# Patient Record
Sex: Female | Born: 1985 | Race: Black or African American | Hispanic: No | Marital: Single | Smoking: Former smoker
Health system: Southern US, Community
[De-identification: ages and names within clinical notes are randomized; demographics above are authoritative.]

## PROBLEM LIST (undated history)

## (undated) ENCOUNTER — Inpatient Hospital Stay (HOSPITAL_COMMUNITY): Payer: Self-pay

## (undated) DIAGNOSIS — B009 Herpesviral infection, unspecified: Secondary | ICD-10-CM

## (undated) DIAGNOSIS — D649 Anemia, unspecified: Secondary | ICD-10-CM

## (undated) DIAGNOSIS — D696 Thrombocytopenia, unspecified: Secondary | ICD-10-CM

## (undated) DIAGNOSIS — Z8719 Personal history of other diseases of the digestive system: Secondary | ICD-10-CM

## (undated) HISTORY — PX: DILATION AND CURETTAGE OF UTERUS: SHX78

## (undated) HISTORY — DX: Thrombocytopenia, unspecified: D69.6

---
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
## 2007-05-05 ENCOUNTER — Encounter: Admission: RE | Admit: 2007-05-05 | Discharge: 2007-05-05 | Payer: Self-pay | Admitting: Internal Medicine

## 2007-09-17 ENCOUNTER — Emergency Department (HOSPITAL_COMMUNITY): Admission: EM | Admit: 2007-09-17 | Discharge: 2007-09-17 | Payer: Self-pay | Admitting: Emergency Medicine

## 2008-04-25 ENCOUNTER — Emergency Department (HOSPITAL_COMMUNITY): Admission: EM | Admit: 2008-04-25 | Discharge: 2008-04-25 | Payer: Self-pay | Admitting: Emergency Medicine

## 2008-10-07 ENCOUNTER — Inpatient Hospital Stay (HOSPITAL_COMMUNITY): Admission: AD | Admit: 2008-10-07 | Discharge: 2008-10-07 | Payer: Self-pay | Admitting: Obstetrics & Gynecology

## 2008-11-02 ENCOUNTER — Inpatient Hospital Stay (HOSPITAL_COMMUNITY): Admission: AD | Admit: 2008-11-02 | Discharge: 2008-11-02 | Payer: Self-pay | Admitting: Obstetrics & Gynecology

## 2008-12-28 ENCOUNTER — Ambulatory Visit (HOSPITAL_COMMUNITY): Admission: RE | Admit: 2008-12-28 | Discharge: 2008-12-28 | Payer: Self-pay | Admitting: Obstetrics & Gynecology

## 2009-06-01 ENCOUNTER — Emergency Department (HOSPITAL_COMMUNITY): Admission: EM | Admit: 2009-06-01 | Discharge: 2009-06-01 | Payer: Self-pay | Admitting: Emergency Medicine

## 2010-04-30 ENCOUNTER — Encounter: Payer: Self-pay | Admitting: Family Medicine

## 2010-06-27 LAB — URINE MICROSCOPIC-ADD ON

## 2010-06-27 LAB — URINALYSIS, ROUTINE W REFLEX MICROSCOPIC
Bilirubin Urine: NEGATIVE
Glucose, UA: NEGATIVE mg/dL
Ketones, ur: NEGATIVE mg/dL
Nitrite: NEGATIVE
Protein, ur: 100 mg/dL — AB
Specific Gravity, Urine: 1.015 (ref 1.005–1.030)
Urobilinogen, UA: 0.2 mg/dL (ref 0.0–1.0)
pH: 7.5 (ref 5.0–8.0)

## 2010-06-27 LAB — POCT PREGNANCY, URINE: Preg Test, Ur: NEGATIVE

## 2010-07-15 LAB — WET PREP, GENITAL
Clue Cells Wet Prep HPF POC: NONE SEEN
Trich, Wet Prep: NONE SEEN
Trich, Wet Prep: NONE SEEN
Yeast Wet Prep HPF POC: NONE SEEN

## 2010-07-15 LAB — GC/CHLAMYDIA PROBE AMP, GENITAL
Chlamydia, DNA Probe: NEGATIVE
Chlamydia, DNA Probe: NEGATIVE
GC Probe Amp, Genital: NEGATIVE
GC Probe Amp, Genital: NEGATIVE

## 2010-07-15 LAB — URINALYSIS, ROUTINE W REFLEX MICROSCOPIC
Bilirubin Urine: NEGATIVE
Glucose, UA: NEGATIVE mg/dL
Hgb urine dipstick: NEGATIVE
Ketones, ur: NEGATIVE mg/dL
Nitrite: NEGATIVE
Protein, ur: NEGATIVE mg/dL
Specific Gravity, Urine: >1.03 — ABNORMAL HIGH (ref 1.005–1.030)
Urobilinogen, UA: 1 mg/dL (ref 0.0–1.0)
pH: 6 (ref 5.0–8.0)

## 2010-07-15 LAB — POCT PREGNANCY, URINE: Preg Test, Ur: POSITIVE

## 2010-08-24 NOTE — Op Note (Signed)
Emily Morse, RELLER               ACCOUNT NO.:  [id number]   MEDICAL RECORD NO.:  [id number]          PATIENT TYPE:  AMB   LOCATION:  SDC                           FACILITY:  [facility]   PHYSICIAN:  Phil D. Okey Dupre, M.D.     DATE OF BIRTH:  1985-12-01   DATE OF PROCEDURE:  12/10/2005  DATE OF DISCHARGE:                                 OPERATIVE REPORT   PREOPERATIVE DIAGNOSIS:  Nine week missed abortion.   POSTOPERATIVE DIAGNOSIS:  Nine week missed abortion.   PROCEDURE:  Suction, dilatation, and evacuation.   SURGEON:  Javier Glazier. Okey Dupre, M.D.   ASSISTANT:  Marc Morgans. Mayford Knife, M.D.   ANESTHESIA:  MAC.   COMPLICATIONS:  None.   ESTIMATED BLOOD LOSS:  Minimal.   INDICATIONS FOR PROCEDURE:  25 year old G1 at approximately [redacted] weeks  gestational age presented to the MAU complaining of period like bleeding.  Ultrasound was done that showed embryonic pregnancy at nine weeks gestation  with a beta HCG of 14,652.  Blood type B positive.  The patient is counseled  on the risks and benefits and wants to proceed with suction D&E.   FINDINGS:  Approximate nine week size anteverted uterus.   DESCRIPTION OF PROCEDURE:  The patient was taken to the operating room where  she was placed under anesthesia.  The cervix and vagina were swabbed with  Betadine.  A sterile speculum was placed, a tenaculum was used to grasp the  anterior lip of the cervix.  The uterus sounded to 10.5 cm.  Hegar dilators  were then used to dilate the cervix to a size 8.  A size 8 suction curet was  used to remove the products of conception without difficulty.  The tenaculum  was removed.  Excellent hemostasis was obtained.  The patient was taken to  the recovery room in stable condition.     ______________________________  Marc Morgans Mayford Knife, M.D.    ______________________________  Javier Glazier. Okey Dupre, M.D.   TLW/MEDQ  D:  12/10/2005  T:  12/10/2005  Job:  [id number]

## 2010-08-24 NOTE — Group Therapy Note (Signed)
Emily Morse, Emily Morse NO.:  [id number]   MEDICAL RECORD NO.:  [id number]          PATIENT TYPE:  WOC   LOCATION:  [facility]                   FACILITY:  [facility]   PHYSICIAN:  Carolanne Grumbling, M.D.   DATE OF BIRTH:  08-14-1985   DATE OF SERVICE:  12/25/2005                                    CLINIC NOTE   CHIEF COMPLAINT:  Follow up from suction D&C for missed AB.   HPI:  The patient is an 25 -year-old G1, P0-0-1-0, status post D&E on  December 10, 2005.  Patient presented to the MAU at approximately [redacted] weeks  gestation complaining of period-like bleeding.  Ultrasound was done that  showed an embryonic pregnancy at approximately [redacted] weeks gestational age with  a beta-hCG of [phone].  Blood type B positive.  D&E was performed.  Please  see dictation for full details.  Patient has not had any trouble since then.  Lochia stopped 2-3 days ago.   PAST MEDICAL HISTORY:  Denies any major health problems.   PAST SURGICAL HISTORY:  Per HPI.   GYN HISTORY:  No history of any Pap smears.  Sexually active at the age of  50.  Menarche age 85.  Menses are approximately 30 days apart that last 7  days and medium in flow.   SOCIAL HISTORY:  Patient lives with her mother and stepfather.  She denies  alcohol or drug use.   COMPLETE REVIEW OF SYSTEMS:  Was done per the intake form.   PHYSICAL EXAM:  Blood pressure 114/76, pulse 76, weight 115 pounds 8 ounces.  GENERAL:  A well-developed, well-nourished female in no apparent distress.  HEENT:  Normocephalic, atraumatic.  NECK:  Supple.  CV:  Regular rate and rhythm.  LUNGS:  Clear to auscultation bilaterally.  ABDOMEN:  Soft, flat, nontender, nondistended.  GU:  Normal external genitalia.  VAGINA:  Pink rugae.  CERVIX:  Multiparous.  UTERUS:  Is retroverted an involuted.  ADNEXA:  Free of masses.   IMPRESSION:  An 25 year old G1, P0-0-1-0, status post suction D&E for missed  AB.   PLAN:  1. Pap smear done today.  Gonorrhea  and Chlamydia within normal limits in      the MAU.  2. Ortho-Evra prescribed.  Patient counseled on how to place it and to      continue to use a back up method for the first month.  3. Pathology of the procedure reviewed with the patient and it did show      products of conception with no hydatidiform changes, so no further      workup is needed.           ______________________________  Carolanne Grumbling, M.D.     TW/MEDQ  D:  12/25/2005  T:  12/26/2005  Job:  [id number]

## 2011-04-11 ENCOUNTER — Inpatient Hospital Stay (HOSPITAL_COMMUNITY)
Admission: AD | Admit: 2011-04-11 | Discharge: 2011-04-11 | Disposition: A | Discharge status: Home / Self Care | Payer: Medicaid Other | Source: Ambulatory Visit | Attending: Obstetrics and Gynecology | Admitting: Obstetrics and Gynecology

## 2011-04-11 ENCOUNTER — Encounter (HOSPITAL_COMMUNITY): Payer: Self-pay

## 2011-04-11 DIAGNOSIS — A499 Bacterial infection, unspecified: Secondary | ICD-10-CM | POA: Insufficient documentation

## 2011-04-11 DIAGNOSIS — R109 Unspecified abdominal pain: Secondary | ICD-10-CM | POA: Insufficient documentation

## 2011-04-11 DIAGNOSIS — N760 Acute vaginitis: Secondary | ICD-10-CM

## 2011-04-11 DIAGNOSIS — B9689 Other specified bacterial agents as the cause of diseases classified elsewhere: Secondary | ICD-10-CM | POA: Insufficient documentation

## 2011-04-11 DIAGNOSIS — O26899 Other specified pregnancy related conditions, unspecified trimester: Secondary | ICD-10-CM

## 2011-04-11 DIAGNOSIS — O2390 Unspecified genitourinary tract infection in pregnancy, unspecified trimester: Secondary | ICD-10-CM | POA: Insufficient documentation

## 2011-04-11 HISTORY — DX: Herpesviral infection, unspecified: B00.9

## 2011-04-11 LAB — URINALYSIS, ROUTINE W REFLEX MICROSCOPIC
Bilirubin Urine: NEGATIVE
Glucose, UA: NEGATIVE mg/dL
Hgb urine dipstick: NEGATIVE
Ketones, ur: 15 mg/dL — AB
Leukocytes, UA: NEGATIVE
Nitrite: NEGATIVE
Protein, ur: NEGATIVE mg/dL
Specific Gravity, Urine: >1.03 — ABNORMAL HIGH (ref 1.005–1.030)
Urobilinogen, UA: 1 mg/dL (ref 0.0–1.0)
pH: 6 (ref 5.0–8.0)

## 2011-04-11 LAB — WET PREP, GENITAL
Trich, Wet Prep: NONE SEEN
Yeast Wet Prep HPF POC: NONE SEEN

## 2011-04-11 MED ORDER — ONDANSETRON 8 MG PO TBDP: 8.0000 mg | ORAL_TABLET | Freq: Three times a day (TID) | ORAL | Status: AC | PRN

## 2011-04-11 MED ORDER — METRONIDAZOLE 500 MG PO TABS: 500.0000 mg | ORAL_TABLET | Freq: Two times a day (BID) | ORAL | Status: AC

## 2011-04-11 MED ORDER — PROMETHAZINE HCL 25 MG PO TABS: 25.0000 mg | ORAL_TABLET | Freq: Four times a day (QID) | ORAL | Status: AC | PRN

## 2011-04-11 NOTE — Progress Notes (Signed)
Patient states she has had lower abdominal pain for 1-2 weeks, just not going away, not worse. Has been having headaches almost daily with the pregnancy. No bleeding or vaginal discharge. [facility] with [facility] on 2[facility]5.

## 2011-04-11 NOTE — ED Provider Notes (Signed)
History     Chief Complaint  Patient presents with  . Abdominal Cramping   HPI  Pt is [redacted]w[redacted]d pregnant and presents with headache "all over" that comes and goes and also lower abdominal cramping on and off for about 1 week.  She has an appointment at [facility] 5 for her NOB appt.  She denies spotting or bleeding.  She denies pain with urination or vaginal discharge.  She has not taken anything for pain.  Sometimes her headache is associated with not eating and sometimes not related.  The headaches are usually in the afternoon. She has some problem with constipation- her last bowel movement was yesterday and it was hard, but not painful.    Past Medical History  Diagnosis Date  . Herpes     last out break 2008    Past Surgical History  Procedure Date  . No past surgeries     No family history on file.  History  Substance Use Topics  . Smoking status: Never Smoker   . Smokeless tobacco: Not on file  . Alcohol Use: No    Allergies: No Known Allergies  Prescriptions prior to admission  Medication Sig Dispense Refill  . Prenatal Vit-Fe Fumarate-FA (PRENATAL MULTIVITAMIN) TABS Take 1 tablet by mouth daily.          ROS Physical Exam   Blood pressure 119/67, pulse 75, temperature 98.8 F (37.1 C), temperature source Oral, resp. rate 16, height 5\' 5"  (1.651 m), weight 125 lb 6.4 oz (56.881 kg), last menstrual period 01/13/2011, SpO2 99.00%.  Physical Exam  Vitals reviewed. Constitutional: She is oriented to person, place, and time. She appears well-developed and well-nourished.  HENT:  Head: Normocephalic.  Eyes: Pupils are equal, round, and reactive to light.  Neck: Normal range of motion. Neck supple.  Cardiovascular: Normal rate.   Respiratory: Effort normal.  GI: Soft. She exhibits no distension. There is no tenderness. There is no rebound and no guarding.       FHR obtained with doppler  Genitourinary:       Mod amount of creamy white discharge in vault; uterus 12  week size nontender; adnexal without palpable enlargement or tenderness  Musculoskeletal: Normal range of motion.  Neurological: She is alert and oriented to person, place, and time.  Skin: Skin is warm and dry.  Psychiatric: She has a normal mood and affect.    MAU Course  Procedures Results for orders placed during the hospital encounter of 04/11/11 (from the past 24 hour(s))  URINALYSIS, ROUTINE W REFLEX MICROSCOPIC     Status: Abnormal   Collection Time   04/11/11  3:10 PM      Component Value Range   Color, Urine YELLOW  YELLOW    APPearance CLEAR  CLEAR    Specific Gravity, Urine >1.030 (*) 1.005 - 1.030    pH 6.0  5.0 - 8.0    Glucose, UA NEGATIVE  NEGATIVE (mg/dL)   Hgb urine dipstick NEGATIVE  NEGATIVE    Bilirubin Urine NEGATIVE  NEGATIVE    Ketones, ur 15 (*) NEGATIVE (mg/dL)   Protein, ur NEGATIVE  NEGATIVE (mg/dL)   Urobilinogen, UA 1.0  0.0 - 1.0 (mg/dL)   Nitrite NEGATIVE  NEGATIVE    Leukocytes, UA NEGATIVE  NEGATIVE   WET PREP, GENITAL     Status: Abnormal   Collection Time   04/11/11  5:35 PM      Component Value Range   Yeast, Wet Prep NONE SEEN  NONE  SEEN    Trich, Wet Prep NONE SEEN  NONE SEEN    Clue Cells, Wet Prep MODERATE (*) NONE SEEN    WBC, Wet Prep HPF POC FEW (*) NONE SEEN   wet prep-BV GC/chlamydia-pending  Assessment and Plan  Bacterial vaginosis in pregnancy- prescription for Flagyl 500 mg BID for 7 days F/u with [facility] for NOB appointment  Ravinder Hofland 04/11/2011, 4:27 PM

## 2011-04-11 NOTE — ED Notes (Signed)
Pt reprots her headache is slowily going away and still reports mild cramping at this time.

## 2011-04-12 LAB — GC/CHLAMYDIA PROBE AMP, GENITAL
Chlamydia, DNA Probe: NEGATIVE
GC Probe Amp, Genital: NEGATIVE

## 2011-04-15 NOTE — ED Provider Notes (Signed)
Agree with above note.  Kaci Freel 04/15/2011 10:02 AM   

## 2011-05-14 ENCOUNTER — Other Ambulatory Visit: Payer: Self-pay | Admitting: Obstetrics

## 2011-05-14 ENCOUNTER — Other Ambulatory Visit: Payer: Self-pay

## 2011-05-14 DIAGNOSIS — Z820 Family history of epilepsy and other diseases of the nervous system: Secondary | ICD-10-CM

## 2011-05-14 LAB — OB RESULTS CONSOLE ANTIBODY SCREEN: Antibody Screen: NEGATIVE

## 2011-05-14 LAB — OB RESULTS CONSOLE HEPATITIS B SURFACE ANTIGEN: Hepatitis B Surface Ag: NEGATIVE

## 2011-05-14 LAB — OB RESULTS CONSOLE ABO/RH: RH Type: POSITIVE

## 2011-05-14 LAB — OB RESULTS CONSOLE GC/CHLAMYDIA
Chlamydia: NEGATIVE
Gonorrhea: NEGATIVE

## 2011-05-14 LAB — OB RESULTS CONSOLE RUBELLA ANTIBODY, IGM: Rubella: IMMUNE

## 2011-05-14 LAB — OB RESULTS CONSOLE HIV ANTIBODY (ROUTINE TESTING): HIV: NONREACTIVE

## 2011-05-14 LAB — OB RESULTS CONSOLE RPR: RPR: NONREACTIVE

## 2011-05-16 ENCOUNTER — Ambulatory Visit (HOSPITAL_COMMUNITY)
Admission: RE | Admit: 2011-05-16 | Discharge: 2011-05-16 | Disposition: A | Discharge status: Home / Self Care | Payer: Medicaid Other | Source: Ambulatory Visit | Attending: Obstetrics | Admitting: Obstetrics

## 2011-05-16 DIAGNOSIS — Z1389 Encounter for screening for other disorder: Secondary | ICD-10-CM | POA: Insufficient documentation

## 2011-05-16 DIAGNOSIS — Z820 Family history of epilepsy and other diseases of the nervous system: Secondary | ICD-10-CM

## 2011-05-16 DIAGNOSIS — Z363 Encounter for antenatal screening for malformations: Secondary | ICD-10-CM | POA: Insufficient documentation

## 2011-05-16 DIAGNOSIS — O09299 Supervision of pregnancy with other poor reproductive or obstetric history, unspecified trimester: Secondary | ICD-10-CM | POA: Insufficient documentation

## 2011-05-16 DIAGNOSIS — O358XX0 Maternal care for other (suspected) fetal abnormality and damage, not applicable or unspecified: Secondary | ICD-10-CM | POA: Insufficient documentation

## 2011-05-16 NOTE — Progress Notes (Signed)
Obstetric ultrasound performed today.  No fetal anomalies identified.  Please see full report in [facility].

## 2011-06-27 ENCOUNTER — Ambulatory Visit (HOSPITAL_COMMUNITY)
Admission: RE | Admit: 2011-06-27 | Discharge: 2011-06-27 | Disposition: A | Discharge status: Home / Self Care | Payer: Medicaid Other | Source: Ambulatory Visit | Attending: Obstetrics | Admitting: Obstetrics

## 2011-06-27 DIAGNOSIS — Z3689 Encounter for other specified antenatal screening: Secondary | ICD-10-CM | POA: Insufficient documentation

## 2011-06-27 DIAGNOSIS — O09299 Supervision of pregnancy with other poor reproductive or obstetric history, unspecified trimester: Secondary | ICD-10-CM | POA: Insufficient documentation

## 2011-06-27 DIAGNOSIS — Z820 Family history of epilepsy and other diseases of the nervous system: Secondary | ICD-10-CM

## 2011-08-26 ENCOUNTER — Other Ambulatory Visit: Payer: Self-pay | Admitting: Obstetrics

## 2011-08-26 DIAGNOSIS — O2690 Pregnancy related conditions, unspecified, unspecified trimester: Secondary | ICD-10-CM

## 2011-08-27 ENCOUNTER — Ambulatory Visit (HOSPITAL_COMMUNITY)
Admission: RE | Admit: 2011-08-27 | Discharge: 2011-08-27 | Disposition: A | Discharge status: Home / Self Care | Payer: Medicaid Other | Source: Ambulatory Visit | Attending: Obstetrics | Admitting: Obstetrics

## 2011-08-27 DIAGNOSIS — O2690 Pregnancy related conditions, unspecified, unspecified trimester: Secondary | ICD-10-CM

## 2011-08-27 DIAGNOSIS — Z3689 Encounter for other specified antenatal screening: Secondary | ICD-10-CM | POA: Insufficient documentation

## 2011-08-27 DIAGNOSIS — O09299 Supervision of pregnancy with other poor reproductive or obstetric history, unspecified trimester: Secondary | ICD-10-CM | POA: Insufficient documentation

## 2011-08-27 LAB — PLATELET COUNT: Platelets: 81 10*3/uL — ABNORMAL LOW (ref 150–400)

## 2011-08-27 NOTE — ED Notes (Signed)
Patient reports positive fetal movement.  Denies contractions, bleeding or leaking.

## 2011-09-10 ENCOUNTER — Ambulatory Visit (INDEPENDENT_AMBULATORY_CARE_PROVIDER_SITE_OTHER): Payer: Medicaid Other | Admitting: Pediatrics

## 2011-09-10 DIAGNOSIS — Z7681 Expectant parent(s) prebirth pediatrician visit: Secondary | ICD-10-CM

## 2011-09-10 NOTE — Progress Notes (Signed)
See for counseling on baby

## 2011-09-23 ENCOUNTER — Other Ambulatory Visit: Payer: Self-pay | Admitting: Obstetrics

## 2011-09-23 DIAGNOSIS — O09299 Supervision of pregnancy with other poor reproductive or obstetric history, unspecified trimester: Secondary | ICD-10-CM

## 2011-09-23 LAB — OB RESULTS CONSOLE GBS: GBS: NEGATIVE

## 2011-09-24 ENCOUNTER — Ambulatory Visit (HOSPITAL_COMMUNITY)
Admission: RE | Admit: 2011-09-24 | Discharge: 2011-09-24 | Disposition: A | Discharge status: Home / Self Care | Payer: Medicaid Other | Source: Ambulatory Visit | Attending: Obstetrics | Admitting: Obstetrics

## 2011-09-24 VITALS — BP 118/67 | HR 83 | Wt 156.0 lb

## 2011-09-24 DIAGNOSIS — O09299 Supervision of pregnancy with other poor reproductive or obstetric history, unspecified trimester: Secondary | ICD-10-CM | POA: Insufficient documentation

## 2011-09-24 DIAGNOSIS — D696 Thrombocytopenia, unspecified: Secondary | ICD-10-CM | POA: Insufficient documentation

## 2011-09-24 DIAGNOSIS — D689 Coagulation defect, unspecified: Secondary | ICD-10-CM | POA: Insufficient documentation

## 2011-09-24 DIAGNOSIS — Z3689 Encounter for other specified antenatal screening: Secondary | ICD-10-CM | POA: Insufficient documentation

## 2011-09-24 LAB — PLATELET COUNT: Platelets: 72 10*3/uL — ABNORMAL LOW (ref 150–400)

## 2011-09-24 NOTE — ED Notes (Signed)
Dr. Claudean Severance made aware of today's platelet count.  In consult with pt now.

## 2011-09-24 NOTE — Progress Notes (Addendum)
MFM note  Emily Morse is a 26 yo G3P0020 currently at 44 2/7 weeks who is referred due to recent onset thrombocytopenia.  Platelets on 5/11 were 81,000.  The patient reports that new OB labs showed a platelet count of 120,000.  The patient has no prior history of "low platelets" and denies bleeding problems (epistaxis, bleeding from gums).  The patient has been followed with serial growth scans this pregnancy due to marginal cord insertion and was seen in this clinic for a comprehensive ultrasound due to hx of ONTD.  She is without complaints today.  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  Gestational thrombocytopenia resolves after delivery and is not associated with fetal thrombocytopenia.  In general, treatment is not recommended for platelet counts of > 50,000.  Given her current gestational age and the fact that she would most likely not be a candidate for regional anesthesia in labor the patient was offered a brief course of Prednisone (40 mg q d x 3 days; 30 mg q d x 3 days; 20 mg q d for 3 days; 10 mg daily for 3 days).  Recommend weekly platelet counts until delivery.  Thank you for this referral.  Please do not hesitate to contact our office if there are further questions.  Alpha Gula, MD  I spent approximately 30 minutes with this patient with over 50% of time spent in face-to-face counseling.

## 2011-10-17 ENCOUNTER — Other Ambulatory Visit: Payer: Self-pay | Admitting: Obstetrics

## 2011-10-17 ENCOUNTER — Encounter (HOSPITAL_COMMUNITY): Payer: Self-pay | Admitting: *Deleted

## 2011-10-17 ENCOUNTER — Telehealth (HOSPITAL_COMMUNITY): Payer: Self-pay | Admitting: *Deleted

## 2011-10-17 NOTE — Telephone Encounter (Signed)
Preadmission screen  

## 2011-10-21 ENCOUNTER — Inpatient Hospital Stay (HOSPITAL_COMMUNITY)
Admission: AD | Admit: 2011-10-21 | Discharge: 2011-10-25 | DRG: 766 | Disposition: A | Discharge status: Home / Self Care | Payer: Medicaid Other | Source: Ambulatory Visit | Attending: Obstetrics & Gynecology | Admitting: Obstetrics & Gynecology

## 2011-10-21 ENCOUNTER — Encounter (HOSPITAL_COMMUNITY): Payer: Self-pay | Admitting: *Deleted

## 2011-10-21 DIAGNOSIS — O9912 Other diseases of the blood and blood-forming organs and certain disorders involving the immune mechanism complicating childbirth: Principal | ICD-10-CM | POA: Diagnosis present

## 2011-10-21 DIAGNOSIS — O4590 Premature separation of placenta, unspecified, unspecified trimester: Secondary | ICD-10-CM | POA: Diagnosis present

## 2011-10-21 DIAGNOSIS — D638 Anemia in other chronic diseases classified elsewhere: Secondary | ICD-10-CM | POA: Diagnosis present

## 2011-10-21 DIAGNOSIS — O324XX0 Maternal care for high head at term, not applicable or unspecified: Secondary | ICD-10-CM | POA: Diagnosis present

## 2011-10-21 DIAGNOSIS — D696 Thrombocytopenia, unspecified: Secondary | ICD-10-CM | POA: Diagnosis present

## 2011-10-21 DIAGNOSIS — Z98891 History of uterine scar from previous surgery: Secondary | ICD-10-CM

## 2011-10-21 DIAGNOSIS — O139 Gestational [pregnancy-induced] hypertension without significant proteinuria, unspecified trimester: Secondary | ICD-10-CM | POA: Diagnosis present

## 2011-10-21 DIAGNOSIS — R519 Headache, unspecified: Secondary | ICD-10-CM

## 2011-10-21 DIAGNOSIS — O26899 Other specified pregnancy related conditions, unspecified trimester: Secondary | ICD-10-CM

## 2011-10-21 DIAGNOSIS — O9902 Anemia complicating childbirth: Secondary | ICD-10-CM | POA: Diagnosis present

## 2011-10-21 DIAGNOSIS — D689 Coagulation defect, unspecified: Principal | ICD-10-CM | POA: Diagnosis present

## 2011-10-21 LAB — COMPREHENSIVE METABOLIC PANEL
ALT: 23 U/L (ref 0–35)
AST: 35 U/L (ref 0–37)
Albumin: 2.7 g/dL — ABNORMAL LOW (ref 3.5–5.2)
Alkaline Phosphatase: 125 U/L — ABNORMAL HIGH (ref 39–117)
BUN: <3 mg/dL — ABNORMAL LOW (ref 6–23)
CO2: 24 mEq/L (ref 19–32)
Calcium: 9.1 mg/dL (ref 8.4–10.5)
Chloride: 105 mEq/L (ref 96–112)
Creatinine, Ser: 0.48 mg/dL — ABNORMAL LOW (ref 0.50–1.10)
GFR calc Af Amer: >90 mL/min (ref >90)
GFR calc non Af Amer: >90 mL/min (ref >90)
Glucose, Bld: 95 mg/dL (ref 70–99)
Potassium: 2.1 mEq/L — CL (ref 3.5–5.1)
Sodium: 140 mEq/L (ref 135–145)
Total Bilirubin: 0.3 mg/dL (ref 0.3–1.2)
Total Protein: 5.7 g/dL — ABNORMAL LOW (ref 6.0–8.3)

## 2011-10-21 LAB — CBC
HCT: 31.5 % — ABNORMAL LOW (ref 36.0–46.0)
HCT: 29.6 % — ABNORMAL LOW (ref 36.0–46.0)
Hemoglobin: 10.5 g/dL — ABNORMAL LOW (ref 12.0–15.0)
Hemoglobin: 10.1 g/dL — ABNORMAL LOW (ref 12.0–15.0)
MCH: 29.2 pg (ref 26.0–34.0)
MCH: 29.9 pg (ref 26.0–34.0)
MCHC: 33.3 g/dL (ref 30.0–36.0)
MCHC: 34.1 g/dL (ref 30.0–36.0)
MCV: 87.5 fL (ref 78.0–100.0)
MCV: 87.6 fL (ref 78.0–100.0)
Platelets: 92 10*3/uL — ABNORMAL LOW (ref 150–400)
Platelets: 91 10*3/uL — ABNORMAL LOW (ref 150–400)
RBC: 3.6 MIL/uL — ABNORMAL LOW (ref 3.87–5.11)
RBC: 3.38 MIL/uL — ABNORMAL LOW (ref 3.87–5.11)
RDW: 15.3 % (ref 11.5–15.5)
RDW: 15.3 % (ref 11.5–15.5)
WBC: 8.9 10*3/uL (ref 4.0–10.5)
WBC: 9.7 10*3/uL (ref 4.0–10.5)

## 2011-10-21 LAB — RPR: RPR Ser Ql: NONREACTIVE

## 2011-10-21 MED ORDER — PREDNISONE 20 MG PO TABS
20.0000 mg | ORAL_TABLET | Freq: Four times a day (QID) | ORAL | Status: DC
  Administered 2011-10-21 (×3): 20 mg via ORAL
  Filled 2011-10-21 (×5): qty 1

## 2011-10-21 MED ORDER — ACETAMINOPHEN 325 MG PO TABS
650.0000 mg | ORAL_TABLET | ORAL | Status: DC | PRN
  Filled 2011-10-21: qty 2

## 2011-10-21 MED ORDER — OXYTOCIN BOLUS FROM INFUSION
250.0000 mL | Freq: Once | INTRAVENOUS | Status: DC
  Filled 2011-10-21: qty 500

## 2011-10-21 MED ORDER — LIDOCAINE HCL (PF) 1 % IJ SOLN
30.0000 mL | INTRAMUSCULAR | Status: DC | PRN
Start: 2011-10-21 — End: 2011-10-23
  Filled 2011-10-21: qty 30

## 2011-10-21 MED ORDER — NALBUPHINE SYRINGE 5 MG/0.5 ML
10.0000 mg | INJECTION | INTRAMUSCULAR | Status: DC | PRN
Start: 2011-10-21 — End: 2011-10-22

## 2011-10-21 MED ORDER — HYDROXYZINE HCL 50 MG PO TABS
50.0000 mg | ORAL_TABLET | Freq: Four times a day (QID) | ORAL | Status: DC | PRN
  Filled 2011-10-21: qty 1

## 2011-10-21 MED ORDER — IBUPROFEN 600 MG PO TABS: 600.0000 mg | ORAL_TABLET | Freq: Four times a day (QID) | ORAL | Status: DC | PRN

## 2011-10-21 MED ORDER — OXYTOCIN 40 UNITS IN LACTATED RINGERS INFUSION - SIMPLE MED: 62.5000 mL/h | Freq: Once | INTRAVENOUS | Status: DC

## 2011-10-21 MED ORDER — TERBUTALINE SULFATE 1 MG/ML IJ SOLN
0.2500 mg | Freq: Once | INTRAMUSCULAR | Status: AC | PRN
  Filled 2011-10-21: qty 1

## 2011-10-21 MED ORDER — ZOLPIDEM TARTRATE 5 MG PO TABS
5.0000 mg | ORAL_TABLET | Freq: Every evening | ORAL | Status: DC | PRN
  Administered 2011-10-21: 5 mg via ORAL
  Filled 2011-10-21: qty 1

## 2011-10-21 MED ORDER — ONDANSETRON HCL 4 MG/2ML IJ SOLN: 4.0000 mg | Freq: Four times a day (QID) | INTRAMUSCULAR | Status: DC | PRN

## 2011-10-21 MED ORDER — POTASSIUM CHLORIDE CRYS ER 20 MEQ PO TBCR
40.0000 meq | EXTENDED_RELEASE_TABLET | Freq: Two times a day (BID) | ORAL | Status: DC
  Administered 2011-10-21 – 2011-10-22 (×3): 40 meq via ORAL
  Filled 2011-10-21 (×4): qty 2

## 2011-10-21 MED ORDER — HYDROXYZINE HCL 50 MG/ML IM SOLN: 50.0000 mg | Freq: Four times a day (QID) | INTRAMUSCULAR | Status: DC | PRN

## 2011-10-21 MED ORDER — OXYCODONE-ACETAMINOPHEN 5-325 MG PO TABS: 1.0000 | ORAL_TABLET | ORAL | Status: DC | PRN

## 2011-10-21 MED ORDER — FLEET ENEMA 7-19 GM/118ML RE ENEM: 1.0000 | ENEMA | RECTAL | Status: DC | PRN

## 2011-10-21 MED ORDER — LACTATED RINGERS IV SOLN: INTRAVENOUS | Status: DC

## 2011-10-21 MED ORDER — LACTATED RINGERS IV SOLN: 500.0000 mL | INTRAVENOUS | Status: DC | PRN

## 2011-10-21 MED ORDER — MISOPROSTOL 25 MCG QUARTER TABLET
25.0000 ug | ORAL_TABLET | ORAL | Status: DC | PRN
  Administered 2011-10-21: 25 ug via VAGINAL
  Filled 2011-10-21: qty 0.25
  Filled 2011-10-21: qty 1

## 2011-10-21 MED ORDER — LACTATED RINGERS IV SOLN
INTRAVENOUS | Status: DC
  Administered 2011-10-21: 16:00:00 via INTRAVENOUS
  Administered 2011-10-21: 125 mL/h via INTRAVENOUS
  Administered 2011-10-21 – 2011-10-22 (×5): via INTRAVENOUS

## 2011-10-21 MED ORDER — CITRIC ACID-SODIUM CITRATE 334-500 MG/5ML PO SOLN
30.0000 mL | ORAL | Status: DC | PRN
  Administered 2011-10-22: 30 mL via ORAL
  Filled 2011-10-21 (×2): qty 15

## 2011-10-21 MED ORDER — ACETAMINOPHEN 325 MG PO TABS
650.0000 mg | ORAL_TABLET | ORAL | Status: DC | PRN
  Administered 2011-10-21 – 2011-10-22 (×4): 650 mg via ORAL
  Filled 2011-10-21 (×3): qty 2

## 2011-10-21 MED ORDER — LIDOCAINE HCL (PF) 1 % IJ SOLN: 30.0000 mL | INTRAMUSCULAR | Status: DC | PRN

## 2011-10-21 MED ORDER — OXYTOCIN 40 UNITS IN LACTATED RINGERS INFUSION - SIMPLE MED
1.0000 m[IU]/min | INTRAVENOUS | Status: DC
  Filled 2011-10-21: qty 1000

## 2011-10-21 MED ORDER — BUTORPHANOL TARTRATE 2 MG/ML IJ SOLN
1.0000 mg | INTRAMUSCULAR | Status: DC | PRN
  Administered 2011-10-21 – 2011-10-22 (×2): 1 mg via INTRAVENOUS
  Filled 2011-10-21 (×2): qty 1

## 2011-10-21 MED ORDER — CITRIC ACID-SODIUM CITRATE 334-500 MG/5ML PO SOLN
30.0000 mL | ORAL | Status: DC | PRN
Start: 2011-10-21 — End: 2011-10-21

## 2011-10-21 NOTE — MAU Note (Signed)
Pt G3 P0 at 40.1wks, headache since 0500.  Pt reports an irregular heartbeat last night-feels fine now.  Low platelets last count 81,000-started steroids 10/17/2011.

## 2011-10-21 NOTE — H&P (Signed)
Emily Morse is a 26 y.o. female presenting for IOL. Maternal Medical History:  Reason for admission: Reason for admission: contractions.  Contractions: Onset was 3-5 hours ago.   Frequency: irregular.    Fetal activity: Perceived fetal activity is normal.   Last perceived fetal movement was within the past hour.    Prenatal complications: Thrombocytopenia.     OB History    Grav Para Term Preterm Abortions TAB SAB Ect Mult Living   3 0 0 0 2  2   0     Past Medical History  Diagnosis Date  . Herpes     last out break 2008  . Thrombocytopenia    Past Surgical History  Procedure Date  . Dilation and curettage of uterus    Family History: family history is negative for Other. Social History:  reports that she has never smoked. She does not have any smokeless tobacco history on file. She reports that she does not drink alcohol or use illicit drugs.   Prenatal Transfer Tool  Maternal Diabetes: No Genetic Screening: Normal Maternal Ultrasounds/Referrals: Normal Fetal Ultrasounds or other Referrals:  None Maternal Substance Abuse:  No Significant Maternal Medications:  Meds include: Other: see prenatal record Significant Maternal Lab Results:  Lab values include: Group B Strep negative Other Comments:  None  Review of Systems  Neurological: Positive for headaches.  All other systems reviewed and are negative.    Dilation: 1.5 Effacement (%): 60 Station: -2 Exam by:: SBeck, RN Blood pressure 153/90, pulse 72, temperature 99.1 F (37.3 C), temperature source Oral, resp. rate 18, height 5\' 4"  (1.626 m), weight 73.755 kg (162 lb 9.6 oz), last menstrual period 01/13/2011. Maternal Exam:  Abdomen: Patient reports no abdominal tenderness. Fetal presentation: vertex     Physical Exam  Nursing note and vitals reviewed. Constitutional: She is oriented to person, place, and time. She appears well-developed and well-nourished.  HENT:  Head: Normocephalic and  atraumatic.  Eyes: Conjunctivae are normal. Pupils are equal, round, and reactive to light.  Neck: Normal range of motion. Neck supple.  Cardiovascular: Normal rate.   Respiratory: Effort normal.  GI: Soft.  Genitourinary: Vagina normal and uterus normal.  Musculoskeletal: Normal range of motion.  Neurological: She is alert and oriented to person, place, and time.  Skin: Skin is warm and dry.    Prenatal labs: ABO, Rh: B/Positive/-- (02/05 0000) Antibody: Negative (02/05 0000) Rubella: Immune (02/05 0000) RPR: Nonreactive (02/05 0000)  HBsAg: Negative (02/05 0000)  HIV: Non-reactive (02/05 0000)  GBS: Negative (06/17 0000)   Assessment/Plan: 40 weeks.  Thrombocytopenia.  2 stage IOL.   Emily Morse A 10/21/2011, 10:34 AM

## 2011-10-21 NOTE — Progress Notes (Signed)
Pt thought water broke. Fern neg

## 2011-10-21 NOTE — Progress Notes (Signed)
Dr. Clearance Coots stated to hold off on placing next cytotec until 2000 tonight. He wants to give the patients platelets a chance to respond to prednisone and hopefully increase to 100 thousand.

## 2011-10-21 NOTE — Progress Notes (Signed)
Dr. Gaynell Face notified of SVE, UC's and inability to place cytotec at this time.  Advised to hold off on cytotec unless contractions began to space.  No new orders received at this time.

## 2011-10-21 NOTE — MAU Provider Note (Signed)
History     CSN: [id number]  Arrival date and time: 10/21/11 4098   First Provider Initiated Contact with Patient 10/21/11 0749      Chief Complaint  Patient presents with  . Headache   HPI Emily Morse is a 26 y.o. female @ [redacted]w[redacted]d gestation who presents to MAU for abdominal pain and headache. She is scheduled for induction tomorrow. She reports that this morning she had heart palpations, felt short of breath and had a bad headache. She has low platelets. She is taking steroids. She had one similar episode of palpations when she first started steroids. Since arrival to MAU the patient states her symptoms have improved. Her headache is much better and she no longer feels the palpations. She does not feel short of breath at this time. The history was provided by the patient.  OB History    Grav Para Term Preterm Abortions TAB SAB Ect Mult Living   3 0 0 0 2  2   0      Past Medical History  Diagnosis Date  . Herpes     last out break 2008  . Thrombocytopenia     Past Surgical History  Procedure Date  . Dilation and curettage of uterus     Family History  Problem Relation Age of Onset  . Other Neg Hx     History  Substance Use Topics  . Smoking status: Never Smoker   . Smokeless tobacco: Not on file  . Alcohol Use: No    Allergies: No Known Allergies  Prescriptions prior to admission  Medication Sig Dispense Refill  . omeprazole (PRILOSEC) 20 MG capsule Take 20 mg by mouth once as needed.      . predniSONE (DELTASONE) 20 MG tablet Take 20 mg by mouth every 6 (six) hours.      . Prenatal Vit-Fe Fumarate-FA (PRENATAL MULTIVITAMIN) TABS Take 1 tablet by mouth daily.          Review of Systems  Constitutional: Negative for fever, chills and weight loss.  HENT: Negative for ear pain, nosebleeds, congestion, sore throat and neck pain.   Eyes: Negative for blurred vision, double vision, photophobia and pain.  Respiratory: Positive for shortness of breath (earlier  this morning but none now). Negative for cough and wheezing.   Cardiovascular: Positive for palpitations and leg swelling. Negative for chest pain.  Gastrointestinal: Negative for heartburn, nausea, vomiting, abdominal pain, diarrhea and constipation.  Genitourinary: Positive for frequency. Negative for dysuria and urgency.  Musculoskeletal: Negative for myalgias and back pain.  Skin: Negative for itching and rash.  Neurological: Positive for headaches. Negative for dizziness, sensory change, speech change, seizures and weakness.  Endo/Heme/Allergies: Does not bruise/bleed easily.  Psychiatric/Behavioral: Negative for depression. The patient is not nervous/anxious.    Physical Exam   Blood pressure 164/78, pulse 73, temperature 99.1 F (37.3 C), temperature source Oral, resp. rate 18, height 5\' 4"  (1.626 m), weight 162 lb 9.6 oz (73.755 kg), last menstrual period 01/13/2011.  Physical Exam  Nursing note and vitals reviewed. Constitutional: She is oriented to person, place, and time. She appears well-developed and well-nourished. No distress.  HENT:  Head: Normocephalic and atraumatic.  Eyes: EOM are normal.  Neck: Neck supple.  Cardiovascular: Normal rate and regular rhythm.   Respiratory: Effort normal. No respiratory distress. She has no wheezes. She has no rales.  GI: There is no tenderness.       Gravid, consistent with dates.  Genitourinary: Vagina normal.  Cervix 1.5 cm., 60%, -2  Musculoskeletal: She exhibits edema.       Mild edema lower legs. No clonus.   Neurological: She is alert and oriented to person, place, and time. She has normal reflexes.  Skin: Skin is warm and dry.  Psychiatric: She has a normal mood and affect. Her behavior is normal. Judgment and thought content normal.   Patient placed on EFM baseline 145, contractions q 2-3 minutes, one variable deceleration,  reactive tracing,  MAU Course: Consult with Dr. Tamela Oddi. Will admit for induction today.    Procedures  Assessment and Plan  Assessment: Headache with elevated BP   Early Labor   Decreased platelets    Plan:  Admit to Labor/Birth for induction   Labs    Arliss Hepburn 10/21/2011, 7:50 AM

## 2011-10-22 ENCOUNTER — Encounter (HOSPITAL_COMMUNITY): Payer: Self-pay | Admitting: Anesthesiology

## 2011-10-22 ENCOUNTER — Encounter (HOSPITAL_COMMUNITY)
Admission: AD | Disposition: A | Discharge status: Home / Self Care | Payer: Self-pay | Source: Ambulatory Visit | Attending: Obstetrics & Gynecology

## 2011-10-22 ENCOUNTER — Inpatient Hospital Stay (HOSPITAL_COMMUNITY): Admission: RE | Admit: 2011-10-22 | Payer: Medicaid Other | Source: Ambulatory Visit

## 2011-10-22 ENCOUNTER — Inpatient Hospital Stay (HOSPITAL_COMMUNITY): Payer: Medicaid Other | Admitting: Anesthesiology

## 2011-10-22 ENCOUNTER — Encounter (HOSPITAL_COMMUNITY): Payer: Self-pay | Admitting: *Deleted

## 2011-10-22 LAB — CBC
HCT: 25.5 % — ABNORMAL LOW (ref 36.0–46.0)
HCT: 30.8 % — ABNORMAL LOW (ref 36.0–46.0)
HCT: 32.1 % — ABNORMAL LOW (ref 36.0–46.0)
Hemoglobin: 10.5 g/dL — ABNORMAL LOW (ref 12.0–15.0)
Hemoglobin: 10.8 g/dL — ABNORMAL LOW (ref 12.0–15.0)
Hemoglobin: 8.7 g/dL — ABNORMAL LOW (ref 12.0–15.0)
MCH: 29.5 pg (ref 26.0–34.0)
MCH: 29.9 pg (ref 26.0–34.0)
MCH: 30.2 pg (ref 26.0–34.0)
MCHC: 33.6 g/dL (ref 30.0–36.0)
MCHC: 34.1 g/dL (ref 30.0–36.0)
MCHC: 34.1 g/dL (ref 30.0–36.0)
MCV: 87.6 fL (ref 78.0–100.0)
MCV: 87.7 fL (ref 78.0–100.0)
MCV: 88.5 fL (ref 78.0–100.0)
Platelets: 102 10*3/uL — ABNORMAL LOW (ref 150–400)
Platelets: 95 10*3/uL — ABNORMAL LOW (ref 150–400)
Platelets: 96 10*3/uL — ABNORMAL LOW (ref 150–400)
RBC: 2.91 MIL/uL — ABNORMAL LOW (ref 3.87–5.11)
RBC: 3.48 MIL/uL — ABNORMAL LOW (ref 3.87–5.11)
RBC: 3.66 MIL/uL — ABNORMAL LOW (ref 3.87–5.11)
RDW: 15.4 % (ref 11.5–15.5)
RDW: 15.5 % (ref 11.5–15.5)
RDW: 15.5 % (ref 11.5–15.5)
WBC: 15.5 10*3/uL — ABNORMAL HIGH (ref 4.0–10.5)
WBC: 15.5 10*3/uL — ABNORMAL HIGH (ref 4.0–10.5)
WBC: 9.7 10*3/uL (ref 4.0–10.5)

## 2011-10-22 LAB — COMPREHENSIVE METABOLIC PANEL
ALT: 24 U/L (ref 0–35)
AST: 34 U/L (ref 0–37)
Albumin: 2.8 g/dL — ABNORMAL LOW (ref 3.5–5.2)
Alkaline Phosphatase: 136 U/L — ABNORMAL HIGH (ref 39–117)
BUN: <3 mg/dL — ABNORMAL LOW (ref 6–23)
CO2: 23 mEq/L (ref 19–32)
Calcium: 8.9 mg/dL (ref 8.4–10.5)
Chloride: 105 mEq/L (ref 96–112)
Creatinine, Ser: 0.47 mg/dL — ABNORMAL LOW (ref 0.50–1.10)
GFR calc Af Amer: >90 mL/min (ref >90)
GFR calc non Af Amer: >90 mL/min (ref >90)
Glucose, Bld: 152 mg/dL — ABNORMAL HIGH (ref 70–99)
Potassium: 2.1 mEq/L — CL (ref 3.5–5.1)
Sodium: 141 mEq/L (ref 135–145)
Total Bilirubin: 0.3 mg/dL (ref 0.3–1.2)
Total Protein: 6.1 g/dL (ref 6.0–8.3)

## 2011-10-22 LAB — PREPARE RBC (CROSSMATCH)

## 2011-10-22 SURGERY — Surgical Case
Anesthesia: Regional

## 2011-10-22 MED ORDER — LACTATED RINGERS IV SOLN: 500.0000 mL | Freq: Once | INTRAVENOUS | Status: DC

## 2011-10-22 MED ORDER — SODIUM CHLORIDE 0.9 % IV SOLN
1.0000 ug/kg/h | INTRAVENOUS | Status: DC | PRN
  Filled 2011-10-22: qty 2.5

## 2011-10-22 MED ORDER — LIDOCAINE HCL (PF) 1 % IJ SOLN
INTRAMUSCULAR | Status: DC | PRN
  Administered 2011-10-22 (×2): 8 mL

## 2011-10-22 MED ORDER — DIPHENHYDRAMINE HCL 50 MG/ML IJ SOLN: 25.0000 mg | INTRAMUSCULAR | Status: DC | PRN

## 2011-10-22 MED ORDER — NALOXONE HCL 0.4 MG/ML IJ SOLN: 0.4000 mg | INTRAMUSCULAR | Status: DC | PRN

## 2011-10-22 MED ORDER — NALBUPHINE HCL 10 MG/ML IJ SOLN
5.0000 mg | INTRAMUSCULAR | Status: DC | PRN
Start: 2011-10-22 — End: 2011-10-25
  Filled 2011-10-22: qty 1

## 2011-10-22 MED ORDER — METOCLOPRAMIDE HCL 5 MG/ML IJ SOLN: 10.0000 mg | Freq: Three times a day (TID) | INTRAMUSCULAR | Status: DC | PRN

## 2011-10-22 MED ORDER — MORPHINE SULFATE (PF) 0.5 MG/ML IJ SOLN
INTRAMUSCULAR | Status: DC | PRN
  Administered 2011-10-22: 4 mg via EPIDURAL

## 2011-10-22 MED ORDER — TERBUTALINE SULFATE 1 MG/ML IJ SOLN
0.2500 mg | Freq: Once | INTRAMUSCULAR | Status: AC
  Administered 2011-10-22: 0.25 mg via SUBCUTANEOUS

## 2011-10-22 MED ORDER — OXYTOCIN 10 UNIT/ML IJ SOLN
INTRAMUSCULAR | Status: AC
  Filled 2011-10-22: qty 4

## 2011-10-22 MED ORDER — KETOROLAC TROMETHAMINE 60 MG/2ML IM SOLN: 60.0000 mg | Freq: Once | INTRAMUSCULAR | Status: AC | PRN

## 2011-10-22 MED ORDER — MAGNESIUM SULFATE 40 G IN LACTATED RINGERS - SIMPLE
2.0000 g/h | INTRAVENOUS | Status: DC
  Administered 2011-10-23: 2 g/h via INTRAVENOUS
  Filled 2011-10-22: qty 500

## 2011-10-22 MED ORDER — SCOPOLAMINE 1 MG/3DAYS TD PT72
1.0000 | MEDICATED_PATCH | Freq: Once | TRANSDERMAL | Status: DC
  Administered 2011-10-22: 1.5 mg via TRANSDERMAL

## 2011-10-22 MED ORDER — METHYLPREDNISOLONE SODIUM SUCC 125 MG IJ SOLR
125.0000 mg | Freq: Once | INTRAMUSCULAR | Status: AC
  Administered 2011-10-22: 125 mg via INTRAVENOUS
  Filled 2011-10-22: qty 2

## 2011-10-22 MED ORDER — SODIUM CHLORIDE 0.9 % IJ SOLN: 3.0000 mL | INTRAMUSCULAR | Status: DC | PRN

## 2011-10-22 MED ORDER — PROMETHAZINE HCL 25 MG/ML IJ SOLN: 6.2500 mg | INTRAMUSCULAR | Status: DC | PRN

## 2011-10-22 MED ORDER — MAGNESIUM SULFATE BOLUS VIA INFUSION
4.0000 g | Freq: Once | INTRAVENOUS | Status: DC
  Filled 2011-10-22: qty 500

## 2011-10-22 MED ORDER — ONDANSETRON HCL 4 MG/2ML IJ SOLN: 4.0000 mg | Freq: Three times a day (TID) | INTRAMUSCULAR | Status: DC | PRN

## 2011-10-22 MED ORDER — ONDANSETRON HCL 4 MG/2ML IJ SOLN
INTRAMUSCULAR | Status: AC
  Filled 2011-10-22: qty 2

## 2011-10-22 MED ORDER — SCOPOLAMINE 1 MG/3DAYS TD PT72
MEDICATED_PATCH | TRANSDERMAL | Status: AC
  Administered 2011-10-22: 1.5 mg via TRANSDERMAL
  Filled 2011-10-22: qty 1

## 2011-10-22 MED ORDER — MEPERIDINE HCL 25 MG/ML IJ SOLN: 6.2500 mg | INTRAMUSCULAR | Status: DC | PRN

## 2011-10-22 MED ORDER — ONDANSETRON HCL 4 MG/2ML IJ SOLN
INTRAMUSCULAR | Status: DC | PRN
  Administered 2011-10-22: 4 mg via INTRAVENOUS

## 2011-10-22 MED ORDER — LABETALOL HCL 5 MG/ML IV SOLN: 20.0000 mg | INTRAVENOUS | Status: DC | PRN

## 2011-10-22 MED ORDER — CEFAZOLIN SODIUM-DEXTROSE 2-3 GM-% IV SOLR
INTRAVENOUS | Status: AC
  Filled 2011-10-22: qty 50

## 2011-10-22 MED ORDER — DIPHENHYDRAMINE HCL 25 MG PO CAPS
25.0000 mg | ORAL_CAPSULE | ORAL | Status: DC | PRN
  Administered 2011-10-23: 25 mg via ORAL
  Filled 2011-10-22: qty 1

## 2011-10-22 MED ORDER — HYDROMORPHONE HCL PF 1 MG/ML IJ SOLN: 0.2500 mg | INTRAMUSCULAR | Status: DC | PRN

## 2011-10-22 MED ORDER — LACTATED RINGERS IV SOLN
INTRAVENOUS | Status: DC
  Administered 2011-10-22: 04:00:00 via INTRAUTERINE

## 2011-10-22 MED ORDER — SODIUM CHLORIDE 0.9 % IJ SOLN
INTRAMUSCULAR | Status: AC
  Filled 2011-10-22: qty 3

## 2011-10-22 MED ORDER — EPHEDRINE 5 MG/ML INJ: 10.0000 mg | INTRAVENOUS | Status: DC | PRN

## 2011-10-22 MED ORDER — MORPHINE SULFATE (PF) 0.5 MG/ML IJ SOLN
INTRAMUSCULAR | Status: DC | PRN
  Administered 2011-10-22 (×2): .5 mg via EPIDURAL

## 2011-10-22 MED ORDER — PHENYLEPHRINE 40 MCG/ML (10ML) SYRINGE FOR IV PUSH (FOR BLOOD PRESSURE SUPPORT)
80.0000 ug | PREFILLED_SYRINGE | INTRAVENOUS | Status: DC | PRN
  Filled 2011-10-22: qty 5

## 2011-10-22 MED ORDER — FENTANYL 2.5 MCG/ML BUPIVACAINE 1/10 % EPIDURAL INFUSION (WH - ANES)
INTRAMUSCULAR | Status: DC | PRN
  Administered 2011-10-22: 14 mL/h via EPIDURAL

## 2011-10-22 MED ORDER — CEFAZOLIN SODIUM 1-5 GM-% IV SOLN
INTRAVENOUS | Status: DC | PRN
  Administered 2011-10-22: 2 g via INTRAVENOUS

## 2011-10-22 MED ORDER — EPHEDRINE 5 MG/ML INJ
10.0000 mg | INTRAVENOUS | Status: DC | PRN
  Filled 2011-10-22: qty 4

## 2011-10-22 MED ORDER — OXYTOCIN 40 UNITS IN LACTATED RINGERS INFUSION - SIMPLE MED
1.0000 m[IU]/min | INTRAVENOUS | Status: DC
  Administered 2011-10-22: 1 m[IU]/min via INTRAVENOUS

## 2011-10-22 MED ORDER — KETOROLAC TROMETHAMINE 30 MG/ML IJ SOLN: 30.0000 mg | Freq: Four times a day (QID) | INTRAMUSCULAR | Status: AC | PRN

## 2011-10-22 MED ORDER — KETOROLAC TROMETHAMINE 30 MG/ML IJ SOLN: 15.0000 mg | Freq: Once | INTRAMUSCULAR | Status: AC | PRN

## 2011-10-22 MED ORDER — TERBUTALINE SULFATE 1 MG/ML IJ SOLN: 0.2500 mg | Freq: Once | INTRAMUSCULAR | Status: DC | PRN

## 2011-10-22 MED ORDER — OXYTOCIN 10 UNIT/ML IJ SOLN
40.0000 [IU] | INTRAVENOUS | Status: DC | PRN
  Administered 2011-10-22: 40 [IU] via INTRAVENOUS

## 2011-10-22 MED ORDER — FENTANYL 2.5 MCG/ML BUPIVACAINE 1/10 % EPIDURAL INFUSION (WH - ANES)
14.0000 mL/h | INTRAMUSCULAR | Status: DC
  Administered 2011-10-22 (×5): 14 mL/h via EPIDURAL
  Filled 2011-10-22 (×6): qty 60

## 2011-10-22 MED ORDER — SODIUM BICARBONATE 8.4 % IV SOLN
INTRAVENOUS | Status: DC | PRN
  Administered 2011-10-22: 5 mL via EPIDURAL

## 2011-10-22 MED ORDER — NALBUPHINE HCL 10 MG/ML IJ SOLN
5.0000 mg | INTRAMUSCULAR | Status: DC | PRN
  Filled 2011-10-22: qty 1

## 2011-10-22 MED ORDER — MORPHINE SULFATE 0.5 MG/ML IJ SOLN
INTRAMUSCULAR | Status: AC
  Filled 2011-10-22: qty 10

## 2011-10-22 MED ORDER — DIPHENHYDRAMINE HCL 50 MG/ML IJ SOLN: 12.5000 mg | INTRAMUSCULAR | Status: DC | PRN

## 2011-10-22 MED ORDER — PHENYLEPHRINE 40 MCG/ML (10ML) SYRINGE FOR IV PUSH (FOR BLOOD PRESSURE SUPPORT): 80.0000 ug | PREFILLED_SYRINGE | INTRAVENOUS | Status: DC | PRN

## 2011-10-22 MED ORDER — MAGNESIUM SULFATE 40 G IN LACTATED RINGERS - SIMPLE
2.0000 g/h | INTRAVENOUS | Status: DC
  Filled 2011-10-22: qty 500

## 2011-10-22 SURGICAL SUPPLY — 43 items
ADH SKN CLS APL DERMABOND .7 (GAUZE/BANDAGES/DRESSINGS)
CANISTER WOUND CARE 500ML ATS (WOUND CARE) IMPLANT
CHLORAPREP W/TINT 26ML (MISCELLANEOUS) ×2 IMPLANT
CLOTH BEACON ORANGE TIMEOUT ST (SAFETY) ×2 IMPLANT
CONTAINER PREFILL 10% NBF 15ML (MISCELLANEOUS) ×4 IMPLANT
DERMABOND ADVANCED (GAUZE/BANDAGES/DRESSINGS)
DERMABOND ADVANCED .7 DNX12 (GAUZE/BANDAGES/DRESSINGS) ×1 IMPLANT
DRSG COVADERM 4X10 (GAUZE/BANDAGES/DRESSINGS) ×1 IMPLANT
DRSG VAC ATS LRG SENSATRAC (GAUZE/BANDAGES/DRESSINGS) IMPLANT
DRSG VAC ATS MED SENSATRAC (GAUZE/BANDAGES/DRESSINGS) IMPLANT
DRSG VAC ATS SM SENSATRAC (GAUZE/BANDAGES/DRESSINGS) IMPLANT
ELECT REM PT RETURN 9FT ADLT (ELECTROSURGICAL) ×2
ELECTRODE REM PT RTRN 9FT ADLT (ELECTROSURGICAL) ×1 IMPLANT
EXTRACTOR VACUUM M CUP 4 TUBE (SUCTIONS) IMPLANT
GLOVE BIO SURGEON STRL SZ8 (GLOVE) ×4 IMPLANT
GLOVE INDICATOR 7.5 STRL GRN (GLOVE) ×2 IMPLANT
GLOVE SURG SS PI 7.5 STRL IVOR (GLOVE) ×2 IMPLANT
GOWN PREVENTION PLUS LG XLONG (DISPOSABLE) ×4 IMPLANT
GOWN PREVENTION PLUS XLARGE (GOWN DISPOSABLE) ×3 IMPLANT
KIT ABG SYR 3ML LUER SLIP (SYRINGE) ×1 IMPLANT
NDL HYPO 25X5/8 SAFETYGLIDE (NEEDLE) ×1 IMPLANT
NEEDLE HYPO 25X5/8 SAFETYGLIDE (NEEDLE) ×2 IMPLANT
NS IRRIG 1000ML POUR BTL (IV SOLUTION) ×2 IMPLANT
PACK C SECTION WH (CUSTOM PROCEDURE TRAY) ×2 IMPLANT
RTRCTR C-SECT PINK 25CM LRG (MISCELLANEOUS) ×2 IMPLANT
SLEEVE SCD COMPRESS KNEE MED (MISCELLANEOUS) ×1 IMPLANT
STAPLER VISISTAT 35W (STAPLE) ×1 IMPLANT
SUT GUT PLAIN 0 CT-3 TAN 27 (SUTURE) IMPLANT
SUT MNCRL 0 VIOLET CTX 36 (SUTURE) ×3 IMPLANT
SUT MNCRL AB 4-0 PS2 18 (SUTURE) IMPLANT
SUT MON AB 2-0 CT1 27 (SUTURE) ×2 IMPLANT
SUT MON AB 3-0 SH 27 (SUTURE)
SUT MON AB 3-0 SH27 (SUTURE) IMPLANT
SUT MONOCRYL 0 CTX 36 (SUTURE) ×3
SUT PDS AB 0 CTX 60 (SUTURE) IMPLANT
SUT PLAIN 2 0 XLH (SUTURE) IMPLANT
SUT VIC AB 0 CTX 36 (SUTURE)
SUT VIC AB 0 CTX36XBRD ANBCTRL (SUTURE) IMPLANT
SUT VIC AB 2-0 CT1 27 (SUTURE)
SUT VIC AB 2-0 CT1 TAPERPNT 27 (SUTURE) IMPLANT
TOWEL OR 17X24 6PK STRL BLUE (TOWEL DISPOSABLE) ×4 IMPLANT
TRAY FOLEY CATH 14FR (SET/KITS/TRAYS/PACK) ×2 IMPLANT
WATER STERILE IRR 1000ML POUR (IV SOLUTION) ×2 IMPLANT

## 2011-10-22 NOTE — Anesthesia Procedure Notes (Signed)
Epidural Patient location during procedure: OB Start time: 10/22/2011 2:14 AM End time: 10/22/2011 2:18 AM Reason for block: procedure for pain  Staffing Anesthesiologist: Sandrea Hughs Performed by: anesthesiologist   Preanesthetic Checklist Completed: patient identified, site marked, surgical consent, pre-op evaluation, timeout performed, IV checked, risks and benefits discussed and monitors and equipment checked  Epidural Patient position: sitting Prep: site prepped and draped and DuraPrep Patient monitoring: continuous pulse ox and blood pressure Approach: midline Injection technique: LOR air  Needle:  Needle type: Tuohy  Needle gauge: 17 G Needle length: 9 cm Needle insertion depth: 4 cm Catheter type: closed end flexible Catheter size: 19 Gauge Catheter at skin depth: 10 cm Test dose: negative and Other  Assessment Sensory level: T8 Events: blood not aspirated, injection not painful, no injection resistance, negative IV test and no paresthesia

## 2011-10-22 NOTE — Anesthesia Preprocedure Evaluation (Signed)
Anesthesia Evaluation  Patient identified by MRN, date of birth, ID band Patient awake    Reviewed: Allergy & Precautions, H&P , NPO status , Patient's Chart, lab work & pertinent test results  Airway Mallampati: I TM Distance: >3 FB Neck ROM: full    Dental No notable dental hx.    Pulmonary neg pulmonary ROS,    Pulmonary exam normal       Cardiovascular negative cardio ROS      Neuro/Psych negative neurological ROS  negative psych ROS   GI/Hepatic negative GI ROS, Neg liver ROS,   Endo/Other  negative endocrine ROS  Renal/GU negative Renal ROS  negative genitourinary   Musculoskeletal   Abdominal Normal abdominal exam  (+)   Peds negative pediatric ROS (+)  Hematology negative hematology ROS (+)   Anesthesia Other Findings   Reproductive/Obstetrics (+) Pregnancy                           Anesthesia Physical Anesthesia Plan  ASA: II  Anesthesia Plan: Epidural   Post-op Pain Management:    Induction:   Airway Management Planned:   Additional Equipment:   Intra-op Plan:   Post-operative Plan:   Informed Consent: I have reviewed the patients History and Physical, chart, labs and discussed the procedure including the risks, benefits and alternatives for the proposed anesthesia with the patient or authorized representative who has indicated his/her understanding and acceptance.     Plan Discussed with:   Anesthesia Plan Comments:         Anesthesia Quick Evaluation  

## 2011-10-22 NOTE — Progress Notes (Signed)
Dr. Gaynell Face notified of FHR change, SVE, and UC's with interventions.  Order for terbutaline 0.25 mg subcutaneous given.  Patient advised of plan of care.  Will call physician for follow-up once medication is given.

## 2011-10-22 NOTE — Progress Notes (Signed)
Emily Morse is a 26 y.o. G3P0020 at [redacted]w[redacted]d by LMP admitted for induction of labor due to Thrombocytopenia..  Subjective:   Objective: BP 150/94  Pulse 92  Temp 98.5 F (36.9 C) (Oral)  Resp 20  Ht 5\' 4"  (1.626 m)  Wt 73.755 kg (162 lb 9.6 oz)  BMI 27.91 kg/m2  SpO2 100%  LMP 01/13/2011 I/O last 3 completed shifts: In: 3317.1 [P.O.:240; I.V.:3077.1] Out: 2175 [Urine:2175] Total I/O In: 2610 [P.O.:960; I.V.:1650] Out: 8850 [Urine:8850]  FHT:  FHR: 150 bpm, variability: moderate,  accelerations:  Present,  decelerations:  Absent UC:   regular, every 2-4 minutes SVE:   Dilation: 2.5 Effacement (%): 80 Station: -3 Exam by:: Enis Slipper, RN  Labs: Lab Results  Component Value Date   WBC 9.7 10/22/2011   HGB 10.8* 10/22/2011   HCT 32.1* 10/22/2011   MCV 87.7 10/22/2011   PLT 96* 10/22/2011    Assessment / Plan: Latent phase.  Increase pitocin per low dose protocol for contractile forces of 180-240 MV units per IUPC.  Labor: Latent phase. Preeclampsia:  on magnesium sulfate Fetal Wellbeing:  Category I Pain Control:  Epidural I/D:  n/a Anticipated MOD:  NSVD  Hoang Pettingill A 10/22/2011, 1:06 PM

## 2011-10-22 NOTE — Anesthesia Postprocedure Evaluation (Signed)
Anesthesia Post Note  Patient: ROSIO WEISS  Procedure(s) Performed: Procedure(s) (LRB): CESAREAN SECTION (N/A)  Anesthesia type: Epidural  Patient location: PACU  Post pain: Pain level controlled  Post assessment: Post-op Vital signs reviewed  Last Vitals:  Filed Vitals:   10/22/11 2001  BP: 169/97  Pulse: 100  Temp:   Resp: 18    Post vital signs: Reviewed  Level of consciousness: awake  Complications: No apparent anesthesia complications

## 2011-10-22 NOTE — Transfer of Care (Signed)
Immediate Anesthesia Transfer of Care Note  Patient: Emily Morse  Procedure(s) Performed: Procedure(s) (LRB): CESAREAN SECTION (N/A)  Patient Location: PACU  Anesthesia Type: Epidural  Level of Consciousness: awake, alert  and oriented  Airway & Oxygen Therapy: Patient Spontanous Breathing  Post-op Assessment: Report given to PACU RN  Post vital signs: Reviewed and stable  Complications: No apparent anesthesia complications

## 2011-10-22 NOTE — Progress Notes (Signed)
Patient ID: Emily Morse, female   DOB: [birth year], 26 y.o.   MRN: [id number] Patient has had blood pressures up to  118/110 and and has  Now  been started on magnesium sulfate 4 g loading and 2 g an hour and Her cervix is 2-3 cm 80% vertex -2 amniotomy was performed and really no fluid obtained no membranes palpated and I IUPC was inserted and amnioinfusion began she has had some variables  In a  late position and   late decelerations she had a  decel  7 minutes earlier on tonight her last Cytotec was inserted at 1 PM yesterday will observe

## 2011-10-22 NOTE — Progress Notes (Signed)
Emily Morse is a 26 y.o. G3P0020 at [redacted]w[redacted]d by LMP admitted for induction of labor due to Thrombocytopenia..  Subjective:   Objective: BP 162/96  Pulse 97  Temp 99.3 F (37.4 C) (Oral)  Resp 16  Ht 5\' 4"  (1.626 m)  Wt 73.755 kg (162 lb 9.6 oz)  BMI 27.91 kg/m2  SpO2 100%  LMP 01/13/2011 I/O last 3 completed shifts: In: 7802.4 [P.O.:3390; I.V.:4412.4] Out: 16109 [Urine:13350]    FHT:  FHR: 150 bpm, variability: moderate,  accelerations:  Present,  decelerations:  Absent UC:   q 2 minutes SVE:   Dilation: 3.5 Effacement (%): 80 Station: -2 Exam by:: E. [facility] RNC  Labs: Lab Results  Component Value Date   WBC 9.7 10/22/2011   HGB 10.8* 10/22/2011   HCT 32.1* 10/22/2011   MCV 87.7 10/22/2011   PLT 96* 10/22/2011    Assessment / Plan: Arrest of decent.  No progress for > 5 hours.  Will proceed with C/S for arrest of descent.  Labor: Arrest of descent, Preeclampsia:  on magnesium sulfate Fetal Wellbeing:  Category I Pain Control:  Epidural I/D:  n/a Anticipated MOD:  C/S  Emily Morse A 10/22/2011, 7:52 PM

## 2011-10-22 NOTE — Op Note (Signed)
Cesarean Section Procedure Note   Emily Morse   10/21/2011 - 10/22/2011  Indications: Arrest of Descent.   Pre-operative Diagnosis: Arrest of Descent.   Post-operative Diagnosis: Same , Marginal Placental Abruption  Surgeon: Kameran Lallier A  Assistants: Surgical Technician  Anesthesia: epidural  Procedure Details:  The patient was seen in the Holding Room. The risks, benefits, complications, treatment options, and expected outcomes were discussed with the patient. The patient concurred with the proposed plan, giving informed consent. The patient was identified as Emily Morse and the procedure verified as C-Section Delivery. A Time Out was held and the above information confirmed.  After induction of anesthesia, the patient was draped and prepped in the usual sterile manner. A transverse incision was made and carried down through the subcutaneous tissue to the fascia. The fascial incision was made and extended transversely. The fascia was separated from the underlying rectus tissue superiorly and inferiorly. The peritoneum was identified and entered. The peritoneal incision was extended longitudinally. The utero-vesical peritoneal reflection was incised transversely and the bladder flap was bluntly freed from the lower uterine segment. A low transverse uterine incision was made.  Moderate amount of port wine amniotic fluid expelled with clots. Delivered from cephalic presentation was a 3455 gram living newborn female infant(s) with Apgar scores of 8 at one minute and 9 at five minutes. A cord ph was sent.  Cord pH = 7.32. The umbilical cord was clamped and cut cord. A sample was obtained for evaluation. The placenta was removed Intact and appeared normal.  The uterine incision was closed with running locked sutures of 0 Monocryl. A second imbricating layer of the same suture was placed.  Hemostasis was observed. The paracolic gutters were irrigated.  The peritoneum closed with running  suture of 2-0 Monocryl. The fascia was then reapproximated with running sutures of 0 Vicryl. The skin closed with staples.  Instrument, sponge, and needle counts were correct prior the abdominal closure and were correct at the conclusion of the case.    Findings:  Normal uterus, ovaries and fallopian tubes.  Bloody amniotic fluid and clot c/w  placental abruption.   Estimated Blood Loss:  1000 ml  Total IV Fluids: 2200 ml   Urine Output: 800CC OF clear urine  Specimens: @ORSPECIMEN @   Complications: no complications  Disposition: PACU - hemodynamically stable.  Maternal Condition: stable   Baby condition / location:  nursery-stable    Signed: Surgeon(s): Brock Bad, MD

## 2011-10-22 NOTE — Anesthesia Postprocedure Evaluation (Signed)
Anesthesia Post Note  Patient: Emily Morse  Procedure(s) Performed: Procedure(s) (LRB): CESAREAN SECTION (N/A)  Anesthesia type: Spinal  Patient location: PACU  Post pain: Pain level controlled  Post assessment: Post-op Vital signs reviewed  Last Vitals:  Filed Vitals:   10/22/11 2001  BP: 169/97  Pulse: 100  Temp:   Resp: 18    Post vital signs: Reviewed  Level of consciousness: awake  Complications: No apparent anesthesia complications

## 2011-10-22 NOTE — Progress Notes (Signed)
Dr. Gaynell Face notified of SVE and patient request for epidural.  New orders received and patient advised of plan of care.

## 2011-10-23 LAB — CBC
HCT: 24.7 % — ABNORMAL LOW (ref 36.0–46.0)
Hemoglobin: 8.6 g/dL — ABNORMAL LOW (ref 12.0–15.0)
MCH: 30.6 pg (ref 26.0–34.0)
MCHC: 34.8 g/dL (ref 30.0–36.0)
MCV: 87.9 fL (ref 78.0–100.0)
Platelets: 102 10*3/uL — ABNORMAL LOW (ref 150–400)
RBC: 2.81 MIL/uL — ABNORMAL LOW (ref 3.87–5.11)
RDW: 15.7 % — ABNORMAL HIGH (ref 11.5–15.5)
WBC: 14.9 10*3/uL — ABNORMAL HIGH (ref 4.0–10.5)

## 2011-10-23 LAB — MRSA PCR SCREENING: MRSA by PCR: NEGATIVE

## 2011-10-23 MED ORDER — ONDANSETRON HCL 4 MG PO TABS: 4.0000 mg | ORAL_TABLET | ORAL | Status: DC | PRN

## 2011-10-23 MED ORDER — PRENATAL MULTIVITAMIN CH
1.0000 | ORAL_TABLET | Freq: Every day | ORAL | Status: DC
  Administered 2011-10-23 – 2011-10-25 (×3): 1 via ORAL
  Filled 2011-10-23 (×3): qty 1

## 2011-10-23 MED ORDER — OXYCODONE-ACETAMINOPHEN 5-325 MG PO TABS
1.0000 | ORAL_TABLET | ORAL | Status: DC | PRN
  Administered 2011-10-23 – 2011-10-25 (×3): 1 via ORAL
  Filled 2011-10-23 (×3): qty 1

## 2011-10-23 MED ORDER — PREDNISONE (PAK) 10 MG PO TABS
20.0000 mg | ORAL_TABLET | Freq: Every evening | ORAL | Status: AC
  Administered 2011-10-23: 20 mg via ORAL

## 2011-10-23 MED ORDER — PREDNISONE (PAK) 10 MG PO TABS
10.0000 mg | ORAL_TABLET | Freq: Four times a day (QID) | ORAL | Status: DC
  Administered 2011-10-24 – 2011-10-25 (×5): 10 mg via ORAL

## 2011-10-23 MED ORDER — DIPHENHYDRAMINE HCL 25 MG PO CAPS
25.0000 mg | ORAL_CAPSULE | Freq: Four times a day (QID) | ORAL | Status: DC | PRN
  Filled 2011-10-23: qty 1

## 2011-10-23 MED ORDER — WITCH HAZEL-GLYCERIN EX PADS: 1.0000 "application " | MEDICATED_PAD | CUTANEOUS | Status: DC | PRN

## 2011-10-23 MED ORDER — MEDROXYPROGESTERONE ACETATE 150 MG/ML IM SUSP: 150.0000 mg | INTRAMUSCULAR | Status: DC | PRN

## 2011-10-23 MED ORDER — PREDNISONE (PAK) 10 MG PO TABS: 10.0000 mg | ORAL_TABLET | ORAL | Status: AC

## 2011-10-23 MED ORDER — DIBUCAINE 1 % RE OINT: 1.0000 "application " | TOPICAL_OINTMENT | RECTAL | Status: DC | PRN

## 2011-10-23 MED ORDER — MAGNESIUM SULFATE 40 G IN LACTATED RINGERS - SIMPLE
2.0000 g/h | INTRAVENOUS | Status: DC
  Administered 2011-10-24: 2 g/h via INTRAVENOUS
  Filled 2011-10-23: qty 500

## 2011-10-23 MED ORDER — DEXTROSE 5 % IV SOLN
2.0000 g | Freq: Four times a day (QID) | INTRAVENOUS | Status: AC
  Administered 2011-10-23 (×2): 2 g via INTRAVENOUS
  Filled 2011-10-23 (×2): qty 2

## 2011-10-23 MED ORDER — OXYTOCIN 40 UNITS IN LACTATED RINGERS INFUSION - SIMPLE MED: 62.5000 mL/h | INTRAVENOUS | Status: AC

## 2011-10-23 MED ORDER — LACTATED RINGERS IV SOLN
INTRAVENOUS | Status: DC
  Administered 2011-10-23: 100 mL/h via INTRAVENOUS

## 2011-10-23 MED ORDER — LANOLIN HYDROUS EX OINT: 1.0000 "application " | TOPICAL_OINTMENT | CUTANEOUS | Status: DC | PRN

## 2011-10-23 MED ORDER — SENNOSIDES-DOCUSATE SODIUM 8.6-50 MG PO TABS
2.0000 | ORAL_TABLET | Freq: Every day | ORAL | Status: DC
  Administered 2011-10-23 – 2011-10-24 (×2): 2 via ORAL

## 2011-10-23 MED ORDER — PREDNISONE (PAK) 10 MG PO TABS
10.0000 mg | ORAL_TABLET | ORAL | Status: AC
  Administered 2011-10-23: 10 mg via ORAL

## 2011-10-23 MED ORDER — SIMETHICONE 80 MG PO CHEW
80.0000 mg | CHEWABLE_TABLET | Freq: Three times a day (TID) | ORAL | Status: DC
  Administered 2011-10-23 – 2011-10-25 (×9): 80 mg via ORAL

## 2011-10-23 MED ORDER — PREDNISONE (PAK) 10 MG PO TABS: 20.0000 mg | ORAL_TABLET | Freq: Every evening | ORAL | Status: AC

## 2011-10-23 MED ORDER — ZOLPIDEM TARTRATE 5 MG PO TABS: 5.0000 mg | ORAL_TABLET | Freq: Every evening | ORAL | Status: DC | PRN

## 2011-10-23 MED ORDER — MENTHOL 3 MG MT LOZG: 1.0000 | LOZENGE | OROMUCOSAL | Status: DC | PRN

## 2011-10-23 MED ORDER — SIMETHICONE 80 MG PO CHEW: 80.0000 mg | CHEWABLE_TABLET | ORAL | Status: DC | PRN

## 2011-10-23 MED ORDER — IBUPROFEN 600 MG PO TABS
600.0000 mg | ORAL_TABLET | Freq: Four times a day (QID) | ORAL | Status: DC
  Administered 2011-10-23 – 2011-10-25 (×9): 600 mg via ORAL
  Filled 2011-10-23 (×10): qty 1

## 2011-10-23 MED ORDER — ONDANSETRON HCL 4 MG/2ML IJ SOLN: 4.0000 mg | INTRAMUSCULAR | Status: DC | PRN

## 2011-10-23 MED ORDER — PREDNISONE (PAK) 10 MG PO TABS
10.0000 mg | ORAL_TABLET | Freq: Three times a day (TID) | ORAL | Status: AC
  Administered 2011-10-23: 20 mg via ORAL
  Administered 2011-10-23: 10 mg via ORAL

## 2011-10-23 MED ORDER — TETANUS-DIPHTH-ACELL PERTUSSIS 5-2.5-18.5 LF-MCG/0.5 IM SUSP
0.5000 mL | Freq: Once | INTRAMUSCULAR | Status: AC
  Administered 2011-10-23: 0.5 mL via INTRAMUSCULAR
  Filled 2011-10-23: qty 0.5

## 2011-10-23 MED ORDER — PREDNISONE (PAK) 10 MG PO TABS
20.0000 mg | ORAL_TABLET | Freq: Every morning | ORAL | Status: AC
  Filled 2011-10-23: qty 21

## 2011-10-23 NOTE — Addendum Note (Signed)
Addendum  created 10/23/11 0905 by Shanon Payor, CRNA   Modules edited:Notes Section

## 2011-10-23 NOTE — Progress Notes (Signed)
Subjective: Postpartum Day 1: Cesarean Delivery Patient reports incisional pain.    Objective: Vital signs in last 24 hours: Temp:  [97.5 F (36.4 C)-100 F (37.8 C)] 97.5 F (36.4 C) (07/17 0800) Pulse Rate:  [85-108] 87  (07/17 0400) Resp:  [11-21] 18  (07/17 0500) BP: (141-179)/(71-101) 143/80 mmHg (07/17 0500) SpO2:  [98 %-100 %] 100 % (07/17 0400)  Physical Exam:  General: alert and no distress Lochia: appropriate Uterine Fundus: firm Incision: healing well DVT Evaluation: No evidence of DVT seen on physical exam.   Basename 10/23/11 0525 10/22/11 2345  HGB 8.6* 8.7*  HCT 24.7* 25.5*    Assessment/Plan: Status post Cesarean section.  For arrest of descent, PIH and marginal abruption of placenta.   Doing well postoperatively.  PIH, stable on Magnesium Sulfate.  Anemia - chronic, clinically stable. Continue current care.  Karthikeya Funke A 10/23/2011, 8:45 AM

## 2011-10-23 NOTE — Anesthesia Postprocedure Evaluation (Signed)
  Anesthesia Post-op Note  Patient: Emily Morse  Procedure(s) Performed: Procedure(s) (LRB): CESAREAN SECTION (N/A)  Patient Location: ICU  Anesthesia Type: Epidural  Level of Consciousness: awake, alert  and oriented  Airway and Oxygen Therapy: Patient Spontanous Breathing  Post-op Pain: none  Post-op Assessment: Post-op Vital signs reviewed and Patient's Cardiovascular Status Stable  Post-op Vital Signs: Reviewed and stable  Complications: No apparent anesthesia complications

## 2011-10-23 NOTE — Progress Notes (Signed)
UR chart review completed.  

## 2011-10-24 ENCOUNTER — Encounter (HOSPITAL_COMMUNITY): Payer: Self-pay | Admitting: Obstetrics

## 2011-10-24 NOTE — Progress Notes (Signed)
Subjective: Postpartum Day 2: Cesarean Delivery Patient reports tolerating PO, + flatus and no problems voiding.    Objective: Vital signs in last 24 hours: Temp:  [97.9 F (36.6 C)-98.5 F (36.9 C)] 98.3 F (36.8 C) (07/18 0400) Pulse Rate:  [80-107] 93  (07/18 0554) Resp:  [16-18] 18  (07/18 0554) BP: (133-157)/(59-95) 140/72 mmHg (07/18 0700) SpO2:  [99 %-100 %] 99 % (07/18 0554)  Physical Exam:  General: alert and no distress Lochia: appropriate Uterine Fundus: firm Incision: healing well DVT Evaluation: No evidence of DVT seen on physical exam.   Basename 10/23/11 0525 10/22/11 2345  HGB 8.6* 8.7*  HCT 24.7* 25.5*    Assessment/Plan: Status post Cesarean section. Doing well postoperatively.  Continue current care.  Zaylin Runco A 10/24/2011, 8:06 AM

## 2011-10-25 LAB — TYPE AND SCREEN
ABO/RH(D): B POS
Antibody Screen: NEGATIVE
Unit division: 0
Unit division: 0

## 2011-10-25 MED ORDER — PREDNISONE (PAK) 10 MG PO TABS: ORAL_TABLET | ORAL | Status: AC

## 2011-10-25 MED ORDER — OXYCODONE-ACETAMINOPHEN 5-325 MG PO TABS: 1.0000 | ORAL_TABLET | ORAL | Status: AC | PRN

## 2011-10-25 MED ORDER — IBUPROFEN 600 MG PO TABS: 600.0000 mg | ORAL_TABLET | Freq: Four times a day (QID) | ORAL | Status: AC

## 2011-10-25 NOTE — Discharge Summary (Signed)
Obstetric Discharge Summary Reason for Admission: induction of labor and thrombocytopenia. Prenatal Procedures: NST and ultrasound Intrapartum Procedures: cesarean: low cervical, transverse Postpartum Procedures: magnesium sulfate Complications-Operative and Postpartum: none Hemoglobin  Date Value Range Status  10/23/2011 8.6* 12.0 - 15.0 g/dL Final     HCT  Date Value Range Status  10/23/2011 24.7* 36.0 - 46.0 % Final    Physical Exam:  General: alert and no distress Lochia: appropriate Uterine Fundus: firm Incision: healing well DVT Evaluation: No evidence of DVT seen on physical exam.  Discharge Diagnoses: Term Pregnancy-delivered, Thrombocytopenia, PIH.  Discharge Information: Date: 10/25/2011 Activity: pelvic rest Diet: routine Medications: PNV, Ibuprofen, Colace and Percocet Condition: stable Instructions: refer to practice specific booklet Discharge to: home Follow-up Information    Follow up with Caydn Justen A, MD. Schedule an appointment as soon as possible for a visit in 6 weeks.   Contact information:   [address] [place] [address] [phone]          Newborn Data: Live born female  Birth Weight: 7 lb 9.9 oz (3455 g) APGAR: 8, 9  Home with mother.  Ayn Domangue A 10/25/2011, 8:21 AM

## 2011-10-25 NOTE — Progress Notes (Signed)
Subjective: Postpartum Day 3: Cesarean Delivery Patient reports tolerating PO, + flatus and no problems voiding.    Objective: Vital signs in last 24 hours: Temp:  [97.8 F (36.6 C)-98.7 F (37.1 C)] 98.7 F (37.1 C) (07/19 4540) Pulse Rate:  [70-93] 78  (07/19 0633) Resp:  [12-18] 18  (07/19 0633) BP: (123-152)/(70-89) 138/89 mmHg (07/19 0633) SpO2:  [100 %] 100 % (07/19 0400)  Physical Exam:  General: alert and no distress Lochia: appropriate Uterine Fundus: firm Incision: healing well DVT Evaluation: No evidence of DVT seen on physical exam.   Basename 10/23/11 0525 10/22/11 2345  HGB 8.6* 8.7*  HCT 24.7* 25.5*    Assessment/Plan: Status post Cesarean section. Doing well postoperatively.  Discharge home with standard precautions and return to clinic in 2 weeks.  Emily Morse A 10/25/2011, 8:05 AM

## 2011-11-21 NOTE — Progress Notes (Signed)
MFM Note  Ms. Renwick is a 26 year old G71P0A2 AA female at 32+[redacted] weeks gestation who presents for consultation due to her falling platelet count. There have no other prenatal complications. Her platelet counts are as follows: 02/05 - 133K; 04/25 - 99K; 05/21 - 81K. She denies any easy bruisability, bleeding gums or nose bleeds. There is no prior history or family history of thrombocytopenia. Current medications only include a prenatal vitamin.   OB history: G1: first trimester SAB in 2008 with D&E G2: D&E for anencephaly in 2010 G3: present Medical history: none except above Surgical history: D&Es x 2; umbilical hernia repair Allergies: NKDA Medications: PNV Social history: denies tob/ETOH/drugs Family history: personal history of ONTD  Assessment: 1) IUP at 32+2 weeks 2) Most likely gestational thrombocytopenia   The implications of a low platelet count in pregnancy were discussed in detail. We reviewed the differences between gestational thrombocytopenia and immune thrombocytopenia including neonatal concerns. The most important issue at this point in the pregnancy is for her to have an option for regional anesthesia; therefore, her platelets need to be > 100K.  Recommendations: 1) Serial platelet counts - every 2-3 weeks 2) If her count is < 100K at 37-38 weeks, will try a short course of prednisone (1 mg/kg po for 5 days) 3) If her count is < 70K, will consider steroids and further work-up for possible immune thrombocytopenia  Thank you for the kind referral.  (Face-to-face consultation with patient: 35 min)

## 2013-05-12 ENCOUNTER — Encounter: Payer: Self-pay | Admitting: Obstetrics

## 2013-05-20 ENCOUNTER — Encounter: Payer: Self-pay | Admitting: Obstetrics

## 2013-06-14 ENCOUNTER — Encounter (HOSPITAL_COMMUNITY): Payer: Self-pay | Admitting: *Deleted

## 2013-06-14 ENCOUNTER — Inpatient Hospital Stay (HOSPITAL_COMMUNITY)
Admission: AD | Admit: 2013-06-14 | Discharge: 2013-06-14 | Disposition: A | Discharge status: Home / Self Care | Payer: Medicaid Other | Source: Ambulatory Visit | Attending: Obstetrics & Gynecology | Admitting: Obstetrics & Gynecology

## 2013-06-14 DIAGNOSIS — R109 Unspecified abdominal pain: Secondary | ICD-10-CM | POA: Insufficient documentation

## 2013-06-14 DIAGNOSIS — O9989 Other specified diseases and conditions complicating pregnancy, childbirth and the puerperium: Secondary | ICD-10-CM

## 2013-06-14 DIAGNOSIS — O26899 Other specified pregnancy related conditions, unspecified trimester: Secondary | ICD-10-CM

## 2013-06-14 DIAGNOSIS — Z3201 Encounter for pregnancy test, result positive: Secondary | ICD-10-CM | POA: Insufficient documentation

## 2013-06-14 LAB — URINALYSIS, ROUTINE W REFLEX MICROSCOPIC
Bilirubin Urine: NEGATIVE
Glucose, UA: NEGATIVE mg/dL
Hgb urine dipstick: NEGATIVE
Ketones, ur: NEGATIVE mg/dL
Leukocytes, UA: NEGATIVE
Nitrite: NEGATIVE
Protein, ur: NEGATIVE mg/dL
Specific Gravity, Urine: 1.02 (ref 1.005–1.030)
Urobilinogen, UA: 0.2 mg/dL (ref 0.0–1.0)
pH: 7 (ref 5.0–8.0)

## 2013-06-14 LAB — WET PREP, GENITAL
Clue Cells Wet Prep HPF POC: NONE SEEN
Trich, Wet Prep: NONE SEEN
WBC, Wet Prep HPF POC: NONE SEEN
Yeast Wet Prep HPF POC: NONE SEEN

## 2013-06-14 LAB — POCT PREGNANCY, URINE: Preg Test, Ur: POSITIVE — AB

## 2013-06-14 MED ORDER — PRENATAL VITAMINS PLUS 27-1 MG PO TABS: 1.0000 | ORAL_TABLET | Freq: Every day | ORAL | Status: DC

## 2013-06-14 NOTE — MAU Note (Signed)
Slight bleeding last night, none this a.m.  Discharge for last 4-5 days - white discharge, no odor or itching.  Lower abd cramping since last night.  Pos HPT in January.

## 2013-06-14 NOTE — Discharge Instructions (Signed)
Abdominal Pain During Pregnancy °Abdominal pain is common in pregnancy. Most of the time, it does not cause harm. There are many causes of abdominal pain. Some causes are more serious than others. Some of the causes of abdominal pain in pregnancy are easily diagnosed. Occasionally, the diagnosis takes time to understand. Other times, the cause is not determined. Abdominal pain can be a sign that something is very wrong with the pregnancy, or the pain may have nothing to do with the pregnancy at all. For this reason, always tell your health care provider if you have any abdominal discomfort. °HOME CARE INSTRUCTIONS  °Monitor your abdominal pain for any changes. The following actions may help to alleviate any discomfort you are experiencing: °· Do not have sexual intercourse or put anything in your vagina until your symptoms go away completely. °· Get plenty of rest until your pain improves. °· Drink clear fluids if you feel nauseous. Avoid solid food as long as you are uncomfortable or nauseous. °· Only take over-the-counter or prescription medicine as directed by your health care provider. °· Keep all follow-up appointments with your health care provider. °SEEK IMMEDIATE MEDICAL CARE IF: °· You are bleeding, leaking fluid, or passing tissue from the vagina. °· You have increasing pain or cramping. °· You have persistent vomiting. °· You have painful or bloody urination. °· You have a fever. °· You notice a decrease in your baby's movements. °· You have extreme weakness or feel faint. °· You have shortness of breath, with or without abdominal pain. °· You develop a severe headache with abdominal pain. °· You have abnormal vaginal discharge with abdominal pain. °· You have persistent diarrhea. °· You have abdominal pain that continues even after rest, or gets worse. °MAKE SURE YOU:  °· Understand these instructions. °· Will watch your condition. °· Will get help right away if you are not doing well or get  worse. °Document Released: 03/25/2005 Document Revised: 01/13/2013 Document Reviewed: 10/22/2012 °[employer]® Patient Information ©2014 [employer]. ° °

## 2013-06-15 LAB — GC/CHLAMYDIA PROBE AMP
CT Probe RNA: NEGATIVE
GC Probe RNA: NEGATIVE

## 2013-08-04 ENCOUNTER — Encounter: Payer: Self-pay | Admitting: Obstetrics

## 2013-08-04 ENCOUNTER — Ambulatory Visit (INDEPENDENT_AMBULATORY_CARE_PROVIDER_SITE_OTHER): Payer: Medicaid Other | Admitting: Obstetrics

## 2013-08-04 ENCOUNTER — Other Ambulatory Visit: Payer: Self-pay | Admitting: Obstetrics

## 2013-08-04 VITALS — BP 112/70 | HR 80 | Temp 99.7°F | Wt 132.0 lb

## 2013-08-04 DIAGNOSIS — D696 Thrombocytopenia, unspecified: Secondary | ICD-10-CM

## 2013-08-04 DIAGNOSIS — O0990 Supervision of high risk pregnancy, unspecified, unspecified trimester: Secondary | ICD-10-CM | POA: Insufficient documentation

## 2013-08-04 DIAGNOSIS — O99119 Other diseases of the blood and blood-forming organs and certain disorders involving the immune mechanism complicating pregnancy, unspecified trimester: Secondary | ICD-10-CM

## 2013-08-04 DIAGNOSIS — Z3480 Encounter for supervision of other normal pregnancy, unspecified trimester: Secondary | ICD-10-CM

## 2013-08-04 DIAGNOSIS — K219 Gastro-esophageal reflux disease without esophagitis: Secondary | ICD-10-CM

## 2013-08-04 DIAGNOSIS — Z3689 Encounter for other specified antenatal screening: Secondary | ICD-10-CM

## 2013-08-04 DIAGNOSIS — Z862 Personal history of diseases of the blood and blood-forming organs and certain disorders involving the immune mechanism: Secondary | ICD-10-CM | POA: Insufficient documentation

## 2013-08-04 LAB — POCT URINALYSIS DIPSTICK
Bilirubin, UA: NEGATIVE
Blood, UA: NEGATIVE
Glucose, UA: NEGATIVE
Ketones, UA: NEGATIVE
Leukocytes, UA: NEGATIVE
Nitrite, UA: NEGATIVE
Spec Grav, UA: 1.015
Urobilinogen, UA: NEGATIVE
pH, UA: 6

## 2013-08-04 LAB — OB RESULTS CONSOLE GC/CHLAMYDIA
Chlamydia: NEGATIVE
Gonorrhea: NEGATIVE

## 2013-08-04 MED ORDER — OMEPRAZOLE 20 MG PO CPDR: 20.0000 mg | DELAYED_RELEASE_CAPSULE | Freq: Two times a day (BID) | ORAL | Status: DC

## 2013-08-04 NOTE — Progress Notes (Signed)
Subjective:    Emily Morse is being seen today for her first obstetrical visit.  This is not a planned pregnancy. She is at 752w4d gestation. Her obstetrical history is significant for Thrombocytopenia. Relationship with FOB: significant other, not living together. Patient does not intend to breast feed. Pregnancy history fully reviewed.  The information documented in the HPI was reviewed and verified.  Menstrual History: OB History   Grav Para Term Preterm Abortions TAB SAB Ect Mult Living   4 1 1  0 2  2   1       Menarche age: [age]  Patient's last menstrual period was 03/13/2013.    Past Medical History  Diagnosis Date  . Herpes     last out break 2008  . Thrombocytopenia     Past Surgical History  Procedure Laterality Date  . Dilation and curettage of uterus    . Cesarean section  10/22/2011    Procedure: CESAREAN SECTION;  Surgeon: Brock Badharles A Briley Bumgarner, MD;  Location: [facility];  Service: Gynecology;  Laterality: N/A;     (Not in a hospital admission) No Known Allergies  History  Substance Use Topics  . Smoking status: Former Smoker    Quit date: 05/06/2013  . Smokeless tobacco: Never Used  . Alcohol Use: No    Family History  Problem Relation Age of Onset  . Other Neg Hx      Review of Systems Constitutional: negative for weight loss Gastrointestinal: negative for vomiting Genitourinary:negative for genital lesions and vaginal discharge and dysuria Musculoskeletal:negative for back pain Behavioral/Psych: negative for abusive relationship, depression, illegal drug usage and tobacco use    Objective:     General Appearance:    Alert, cooperative, no distress, appears stated age  Head:    Normocephalic, without obvious abnormality, atraumatic  Eyes:    PERRL, conjunctiva/corneas clear, EOM's intact, fundi    benign, both eyes  Ears:    Normal TM's and external ear canals, both ears  Nose:   Nares normal, septum midline, mucosa normal, no drainage    or sinus  tenderness  Throat:   Lips, mucosa, and tongue normal; teeth and gums normal  Neck:   Supple, symmetrical, trachea midline, no adenopathy;    thyroid:  no enlargement/tenderness/nodules; no carotid   bruit or JVD  Back:     Symmetric, no curvature, ROM normal, no CVA tenderness  Lungs:     Clear to auscultation bilaterally, respirations unlabored  Chest Wall:    No tenderness or deformity   Heart:    Regular rate and rhythm, S1 and S2 normal, no murmur, rub   or gallop  Breast Exam:    No tenderness, masses, or nipple abnormality  Abdomen:     Soft, non-tender, bowel sounds active all four quadrants,    no masses, no organomegaly  Genitalia:    Normal female without lesion, discharge or tenderness  Extremities:   Extremities normal, atraumatic, no cyanosis or edema  Pulses:   2+ and symmetric all extremities  Skin:   Skin color, texture, turgor normal, no rashes or lesions  Lymph nodes:   Cervical, supraclavicular, and axillary nodes normal  Neurologic:   CNII-XII intact, normal strength, sensation and reflexes    throughout      Lab Review Urine pregnancy test Labs reviewed yes Radiologic studies reviewed no Assessment:    Pregnancy at 3352w4d weeks    Plan:      Prenatal vitamins.  Counseling provided regarding continued use of seat  belts, cessation of alcohol consumption, smoking or use of illicit drugs; infection precautions i.e., influenza/TDAP immunizations, toxoplasmosis,CMV, parvovirus, listeria and varicella; workplace safety, exercise during pregnancy; routine dental care, safe medications, sexual activity, hot tubs, saunas, pools, travel, caffeine use, fish and methlymercury, potential toxins, hair treatments, varicose veins Weight gain recommendations per IOM guidelines reviewed: underweight/BMI< 18.5--> gain 28 - 40 lbs; normal weight/BMI 18.5 - 24.9--> gain 25 - 35 lbs; overweight/BMI 25 - 29.9--> gain 15 - 25 lbs; obese/BMI >30->gain  11 - 20 lbs Problem list reviewed  and updated. FIRST/CF mutation testing/NIPT/QUAD SCREEN/fragile X/Ashkenazi Jewish population testing/Spinal muscular atrophy discussed: requested. Role of ultrasound in pregnancy discussed; fetal survey: requested. Amniocentesis discussed: not indicated. VBAC calculator score: VBAC consent form provided Meds ordered this encounter  Medications  . omeprazole (PRILOSEC) 20 MG capsule    Sig: Take 1 capsule (20 mg total) by mouth 2 (two) times daily before a meal.    Dispense:  60 capsule    Refill:  5   Orders Placed This Encounter  Procedures  . Culture, OB Urine  . Obstetric panel  . HIV antibody  . Hemoglobinopathy evaluation  . Varicella zoster antibody, IgG  . Vit D  25 hydroxy (rtn osteoporosis monitoring)  . AMB Referral to Maternal Fetal Medicine (MFM)    Referral Priority:  Routine    Referral Type:  Consultation    Referral Reason:  Second Opinion    Number of Visits Requested:  1  . POCT urinalysis dipstick    Follow up in 4 weeks.

## 2013-08-04 NOTE — Addendum Note (Signed)
Addended by: Coral CeoHARPER, Abrianna Sidman A on: 08/04/2013 11:56 AM   Modules accepted: Orders

## 2013-08-04 NOTE — Addendum Note (Signed)
Addended by: Odessa FlemingBOHNE, Dixon Luczak M on: 08/04/2013 04:02 PM   Modules accepted: Orders

## 2013-08-05 LAB — GC/CHLAMYDIA PROBE AMP
CT Probe RNA: NEGATIVE
GC Probe RNA: NEGATIVE

## 2013-08-05 LAB — VARICELLA ZOSTER ANTIBODY, IGG: Varicella IgG: 2084 Index — ABNORMAL HIGH (ref <135.00)

## 2013-08-05 LAB — OBSTETRIC PANEL
Antibody Screen: NEGATIVE
Basophils Absolute: 0 10*3/uL (ref 0.0–0.1)
Basophils Relative: 0 % (ref 0–1)
Eosinophils Absolute: 0.1 10*3/uL (ref 0.0–0.7)
Eosinophils Relative: 1 % (ref 0–5)
HCT: 34.4 % — ABNORMAL LOW (ref 36.0–46.0)
Hemoglobin: 11.5 g/dL — ABNORMAL LOW (ref 12.0–15.0)
Hepatitis B Surface Ag: NEGATIVE
Lymphocytes Relative: 15 % (ref 12–46)
Lymphs Abs: 0.9 10*3/uL (ref 0.7–4.0)
MCH: 28.6 pg (ref 26.0–34.0)
MCHC: 33.4 g/dL (ref 30.0–36.0)
MCV: 85.6 fL (ref 78.0–100.0)
Monocytes Absolute: 0.4 10*3/uL (ref 0.1–1.0)
Monocytes Relative: 6 % (ref 3–12)
Neutro Abs: 4.9 10*3/uL (ref 1.7–7.7)
Neutrophils Relative %: 78 % — ABNORMAL HIGH (ref 43–77)
Platelets: 84 10*3/uL — ABNORMAL LOW (ref 150–400)
RBC: 4.02 MIL/uL (ref 3.87–5.11)
RDW: 16.3 % — ABNORMAL HIGH (ref 11.5–15.5)
Rh Type: POSITIVE
Rubella: 3.62 Index — ABNORMAL HIGH (ref <0.90)
WBC: 6.3 10*3/uL (ref 4.0–10.5)

## 2013-08-05 LAB — WET PREP BY MOLECULAR PROBE
Candida species: NEGATIVE
Gardnerella vaginalis: NEGATIVE
Trichomonas vaginosis: NEGATIVE

## 2013-08-05 LAB — VITAMIN D 25 HYDROXY (VIT D DEFICIENCY, FRACTURES): Vit D, 25-Hydroxy: 34 ng/mL (ref 30–89)

## 2013-08-05 LAB — HIV ANTIBODY (ROUTINE TESTING W REFLEX): HIV 1&2 Ab, 4th Generation: NONREACTIVE

## 2013-08-06 ENCOUNTER — Ambulatory Visit (HOSPITAL_COMMUNITY)
Admission: RE | Admit: 2013-08-06 | Discharge: 2013-08-06 | Disposition: A | Discharge status: Home / Self Care | Payer: Medicaid Other | Source: Ambulatory Visit | Attending: Obstetrics | Admitting: Obstetrics

## 2013-08-06 DIAGNOSIS — Z3689 Encounter for other specified antenatal screening: Secondary | ICD-10-CM

## 2013-08-06 DIAGNOSIS — D693 Immune thrombocytopenic purpura: Secondary | ICD-10-CM | POA: Insufficient documentation

## 2013-08-06 DIAGNOSIS — D696 Thrombocytopenia, unspecified: Secondary | ICD-10-CM

## 2013-08-06 DIAGNOSIS — D689 Coagulation defect, unspecified: Secondary | ICD-10-CM | POA: Insufficient documentation

## 2013-08-06 DIAGNOSIS — O34219 Maternal care for unspecified type scar from previous cesarean delivery: Secondary | ICD-10-CM | POA: Insufficient documentation

## 2013-08-06 DIAGNOSIS — O99119 Other diseases of the blood and blood-forming organs and certain disorders involving the immune mechanism complicating pregnancy, unspecified trimester: Principal | ICD-10-CM

## 2013-08-06 LAB — HEMOGLOBINOPATHY EVALUATION
Hemoglobin Other: 0 %
Hgb A2 Quant: 2.3 % (ref 2.2–3.2)
Hgb A: 97.7 % (ref 96.8–97.8)
Hgb F Quant: 0 % (ref 0.0–2.0)
Hgb S Quant: 0 %

## 2013-08-06 LAB — AFP, QUAD SCREEN
AFP: 70.1 IU/mL
Age Alone: 1:1020 {titer}
Curr Gest Age: 20.4 wks.days
Down Syndrome Scr Risk Est: 1:938 {titer}
HCG, Total: 39650 m[IU]/mL
INH: 714.6 pg/mL
Interpretation-AFP: NEGATIVE
MoM for AFP: 1.11
MoM for INH: 3.68
MoM for hCG: 2.53
Open Spina bifida: NEGATIVE
Osb Risk: 1:18500 {titer}
Tri 18 Scr Risk Est: NEGATIVE
Trisomy 18 (Edward) Syndrome Interp.: <1:65800 {titer}
uE3 Mom: 1.52
uE3 Value: 2.3 ng/mL

## 2013-08-06 LAB — CULTURE, OB URINE
Colony Count: NO GROWTH
Organism ID, Bacteria: NO GROWTH

## 2013-08-06 NOTE — Consult Note (Signed)
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   A relevant discussion follows:  I. 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  The overall course of ITP is not consistently influenced by pregnancy; however, pregnancy may be adversely affected by ITP, and the primary risk is hemorrhage in the peripartum period. Because the placenta selectively transports maternal IgG antiplatelet antibodies into the fetal circulation, fetal thrombocytopenia can occur.goal of maternal therapy during pregnancy is to minimize the risk of hemorrhage and to restore a normal platelet count.   Asymptomatic pregnant women with ITP and platelet counts greater than 50,000/L do not require treatment. A reasonable approach is to aim for a platelet count greater than 30,000/L throughout pregnancy and greater than 50,000/L near term with platelet count performed monthly until [redacted] weeks gestation and then weekly (only if less than 80,000) until delivery.  Her obstetric panel from 08/04/13 remains pending and is not available for my review to determine if she has ITP, gestational thrombocytopenia, or a normal platelet count.  The American Society of Hematology ITP Practice Guideline Panel recommends treating pregnant women with platelet counts between 10,000 and 30,000/L during the second or third trimester. More aggressive treatment is often pursued close to the estimated due date. Some anesthesiologists may require a platelet count greater than 80,000/L for placement of an epidural catheter and we would recommend consultation with anesthesia prior to delivery if platelet count is less than 100,000 at term.  Corticosteroid drugs have been  the cornerstone of ITP therapy in pregnancy and this patient has responded well to oral prednisone for her thrombocytopenia. Prednisone, 1 to 1.5 mg/kg/day is the initial treatment of choice. Improvement usually occurs within 3 to 7 days and reaches a maximum within 2 to 3 weeks. If platelet counts become normal, the steroid dose can be tapered by 10% to 20% per week until the lowest dosage required to maintain the platelet count higher than 50,000/L is reached.  IVIG is used in cases of ITP refractory to corticosteroids as well as in urgent circumstances, such as preoperatively, in the peripartum period, or when the platelet count is less than 10,000/L (or <30,000/L in a bleeding patient). Hematology consultation should be considered prior to IVIG use.  Platelet transfusions should be considered only as a temporary measure to control life-threatening hemorrhage or to prepare a patient for cesarean delivery or other surgery. Survival of transfused platelets is decreased in patients with ITP, because antiplatelet antibodies also bind to platelets. Therefore, the usual elevation in platelets of approximately 10,000/L per unit of platelet concentrate is not achieved in patients with ITP. A transfusion of 8 to 10 packs is sufficient in most cases. Patients should also be counseled about the infectious risk associated with platelet transfusion.  Management of ITP in pregnancy remains controversial, but most investigators now believe that fetal scalp sampling, cordocentesis, and cesarean delivery contribute to cost and morbidity without preventing neonatal bleeding complications. Therefore, it is recommended that ITP be managed without determination of the fetal platelet count and that cesarean delivery be reserved for the usual obstetric indications. If severe maternal thrombocytopenia is noted (<90,000) delivery should be accomplished in a setting in which platelets, fresh-frozen plasma, and IVIG are available.  A  neonatologist or pediatrician familiar with the disorder should be present to promptly treat any hemorrhagic complications in the neonate. The platelet count of the affected newborn usually falls after delivery, and the lowest platelet count is not reached for several days however consideration should be given to obtaining cord blood at the time of delivery for assessment of fetal platelet count.  II. VBAC/TOLAC in setting of prior cesarean delivery (x1):  I discussed the ACOG criteria for allowing trial of labor for patients with previous cesarean section. These include: one prior low-transverse cesarean delivery (or two prior low-transverse cesareans with a prior vaginal delivery), a clinically adequate pelvis, and no other uterine scars or previous rupture. Additionally, a physician must be immediately available throughout labor and capable of performing emergency cesarean section with anesthesia and other personnel available for emergency cesarean delivery.  I talked about the likelihood of success (vaginal delivery) and the risks of attempting trial of labor. Making sure that your patient understood that it is only an estimation, I told her that her likelihood of success is probably in the range of around 65-75%, owing to (one prior cesarean section in context of cesarean delivery performed for hemorrhage in latent labor--not a labor dystocia). Conversely, I told her that there is a likelihood of at least 25% that she will require cesarean delivery, even if she attempts trial of labor under appropriate circumstances.  I spoke about the risks of attempting VBAC. I outlined the increased risk of maternal and neonatal morbidity that accompanies failed VBAC, including hemorrhage, infection, etc. I spoke also about the possibility of uterine rupture with previous cesarean section. The rate of symptomatic uterine rupture appears to be slightly less than 0.5% with spontaneous, unaugmented labor and one prior  cesarean section.   Although a number of factors associated with uterine rupture have been identified (prostaglandin cervical ripening, oxytocin use, etc.), I explained that there are no accurate predictors of uterine rupture and that the first sign of complication is usually an abnormal FHR pattern or fetal bradycardia. Finally, I we spoke about the issue of inducing labor in the face of previous cesarean section. I told her that patients are at lowest risk of uterine rupture with spontaneous labor, but use of prostaglandin for ripening agents for induction appears to increase the rate of rupture, substantially so for the induction protocols. I told your patient that most clinicians in tertiary centers consider prostaglandin ripening to be contraindicated in the presence of history of cesarean so I would not use them. However, if ripening were needed, I would recommend a foley bulb for mechanical dilation with low dose pitocin at around 39 0/7 to 39 6/[redacted] weeks gestation.  Obviously, the best scenario would be for spontaneous labor with attempt for trial of labor after cesarean, having the best chance for successful VBAC and lowest risk profile for her and baby.  I spoke about the advantages and disadvantages of repeat cesarean delivery, including the perceived increase in risk of complications with increasing number of cesarean sections, especially surgical complications at delivery and placenta accreta. I answered her questions. I told your patient that the perceptions of likelihood of success, chance of failure, and risk of complication are individual ones, and she understands that she needs to participate in the decision with you. I told her I would be glad to discuss it with her as her pregnancy goes forward. As for the timing of a scheduled repeat cesarean section, if one is chosen, I told her that  scheduled abdominal deliveries are typically done about a week or so prior to the established due  date.   Assessment: Emily Morse is at 27yo [id number] at 16 6/[redacted] weeks gestation with history of ITP and prior cesarean x 1:  Plan/Recommendations: (1) if platelet count is >150,000, routine monitoring of CBC with assessments again at 28 weeks and [redacted] weeks gestation. (2) if platelet count is 100,000 to 150,000 recommend monthly CBC. (3) if platelet count is ever <100,000, recommend MFM consultation for reassessment and co-management/monitoring (4) if platelet count is less than 50,000, I additionally recommend initiation of prednisone 1mg /kg/day (call MFM first), weekly platelet counts (along with assessment to rule out preeclampsia/TTP/HELLP), and referral to and follow up with Heme-Onc at regular 1-2 week intervals for the remainder of pregnancy (5) routine prenatal care with you locally (unless transfer of care is desired) (6) again if severe thrombocytopenia (<100,000) is ever noted, I would recommend reconsultation with MFM. (7) interval growth in 6 weeks owing to poor dating (dating by 21 6/7 week ultrasound today (uncertain LMP:  "sometime in December..I just gave them a guess") and late presentation to care. (8) TOLAC encouraged (9) our group will remain available for follow up consultation.  Time Spent:  I spent in excess of 40 minutes in consultation with this patient to review records, evaluate her case, and provide her with an adequate discussion and education.  More than 50% of this time was spent in direct face-to-face counseling. It was a pleasure seeing your patient in the office today.  Thank you for consultation. Please do not hesitate to contact our service for any further questions.  Page with questions.  Merideth Abbey, MD, MS, FACOG Assistant Professor, Maternal-Fetal Medicine --

## 2013-08-09 LAB — PAP IG W/ RFLX HPV ASCU

## 2013-08-13 ENCOUNTER — Other Ambulatory Visit: Payer: Self-pay | Admitting: Obstetrics

## 2013-08-13 DIAGNOSIS — O9989 Other specified diseases and conditions complicating pregnancy, childbirth and the puerperium: Principal | ICD-10-CM

## 2013-08-13 DIAGNOSIS — O352XX0 Maternal care for (suspected) hereditary disease in fetus, not applicable or unspecified: Secondary | ICD-10-CM

## 2013-08-13 DIAGNOSIS — O98519 Other viral diseases complicating pregnancy, unspecified trimester: Secondary | ICD-10-CM

## 2013-08-13 DIAGNOSIS — O0930 Supervision of pregnancy with insufficient antenatal care, unspecified trimester: Secondary | ICD-10-CM

## 2013-08-13 DIAGNOSIS — O99891 Other specified diseases and conditions complicating pregnancy: Secondary | ICD-10-CM

## 2013-08-13 DIAGNOSIS — O3421 Maternal care for scar from previous cesarean delivery: Secondary | ICD-10-CM

## 2013-09-01 ENCOUNTER — Encounter: Payer: Medicaid Other | Admitting: Obstetrics

## 2013-09-01 ENCOUNTER — Other Ambulatory Visit: Payer: Medicaid Other

## 2013-09-02 ENCOUNTER — Encounter: Payer: Self-pay | Admitting: Obstetrics

## 2013-09-02 ENCOUNTER — Ambulatory Visit (INDEPENDENT_AMBULATORY_CARE_PROVIDER_SITE_OTHER): Payer: Medicaid Other | Admitting: Obstetrics

## 2013-09-02 ENCOUNTER — Other Ambulatory Visit: Payer: Medicaid Other

## 2013-09-02 VITALS — BP 129/71 | HR 77 | Temp 98.8°F | Wt 136.0 lb

## 2013-09-02 DIAGNOSIS — Z3480 Encounter for supervision of other normal pregnancy, unspecified trimester: Secondary | ICD-10-CM

## 2013-09-02 LAB — POCT URINALYSIS DIPSTICK
Blood, UA: NEGATIVE
Glucose, UA: NEGATIVE
Ketones, UA: NEGATIVE
Leukocytes, UA: NEGATIVE
Nitrite, UA: POSITIVE
Spec Grav, UA: 1.02
pH, UA: 6

## 2013-09-02 LAB — CBC
HCT: 32.7 % — ABNORMAL LOW (ref 36.0–46.0)
Hemoglobin: 11.2 g/dL — ABNORMAL LOW (ref 12.0–15.0)
MCH: 30.3 pg (ref 26.0–34.0)
MCHC: 34.3 g/dL (ref 30.0–36.0)
MCV: 88.4 fL (ref 78.0–100.0)
Platelets: 77 10*3/uL — ABNORMAL LOW (ref 150–400)
RBC: 3.7 MIL/uL — ABNORMAL LOW (ref 3.87–5.11)
RDW: 14.7 % (ref 11.5–15.5)
WBC: 6.6 10*3/uL (ref 4.0–10.5)

## 2013-09-02 NOTE — Progress Notes (Signed)
Subjective:    Emily Morse is a 28 y.o. female being seen today for her obstetrical visit. She is at [redacted]w[redacted]d gestation. Patient reports: no complaints . Fetal movement: normal.  Problem List Items Addressed This Visit   None    Visit Diagnoses   Supervision of other normal pregnancy    -  Primary    Relevant Orders       POCT urinalysis dipstick      Patient Active Problem List   Diagnosis Date Noted  . Unspecified high-risk pregnancy 08/04/2013  . H/O thrombocytopenia 08/04/2013  . GERD without esophagitis 08/04/2013   Objective:    BP 129/71  Pulse 77  Temp(Src) 98.8 F (37.1 C)  Wt 136 lb (61.689 kg)  LMP 03/13/2013 FHT: 150 BPM  Uterine Size: size equals dates     Assessment:    Pregnancy @ [redacted]w[redacted]d    Plan:    OBGCT: discussed.  Labs, problem list reviewed and updated 2 hr GTT planned Follow up in 2 weeks.

## 2013-09-02 NOTE — Addendum Note (Signed)
Addended by: Odessa Fleming on: 09/02/2013 03:25 PM   Modules accepted: Orders

## 2013-09-03 LAB — GLUCOSE TOLERANCE, 2 HOURS W/ 1HR
Glucose, 1 hour: 71 mg/dL (ref 70–170)
Glucose, 2 hour: 61 mg/dL — ABNORMAL LOW (ref 70–139)
Glucose, Fasting: 69 mg/dL — ABNORMAL LOW (ref 70–99)

## 2013-09-03 LAB — RPR

## 2013-09-03 LAB — URINE CULTURE
Colony Count: NO GROWTH
Organism ID, Bacteria: NO GROWTH

## 2013-09-03 LAB — HIV ANTIBODY (ROUTINE TESTING W REFLEX): HIV 1&2 Ab, 4th Generation: NONREACTIVE

## 2013-09-16 ENCOUNTER — Encounter: Payer: Medicaid Other | Admitting: Obstetrics

## 2013-09-17 ENCOUNTER — Ambulatory Visit (HOSPITAL_COMMUNITY)
Admission: RE | Admit: 2013-09-17 | Discharge: 2013-09-17 | Disposition: A | Discharge status: Home / Self Care | Payer: Medicaid Other | Source: Ambulatory Visit | Attending: Obstetrics | Admitting: Obstetrics

## 2013-09-17 ENCOUNTER — Encounter (HOSPITAL_COMMUNITY): Payer: Self-pay

## 2013-09-17 DIAGNOSIS — O9989 Other specified diseases and conditions complicating pregnancy, childbirth and the puerperium: Secondary | ICD-10-CM

## 2013-09-17 DIAGNOSIS — O34219 Maternal care for unspecified type scar from previous cesarean delivery: Secondary | ICD-10-CM | POA: Insufficient documentation

## 2013-09-17 DIAGNOSIS — O3421 Maternal care for scar from previous cesarean delivery: Secondary | ICD-10-CM

## 2013-09-17 DIAGNOSIS — O98519 Other viral diseases complicating pregnancy, unspecified trimester: Secondary | ICD-10-CM | POA: Insufficient documentation

## 2013-09-17 DIAGNOSIS — O352XX0 Maternal care for (suspected) hereditary disease in fetus, not applicable or unspecified: Secondary | ICD-10-CM | POA: Insufficient documentation

## 2013-09-17 DIAGNOSIS — D693 Immune thrombocytopenic purpura: Secondary | ICD-10-CM | POA: Insufficient documentation

## 2013-09-17 DIAGNOSIS — D689 Coagulation defect, unspecified: Secondary | ICD-10-CM | POA: Insufficient documentation

## 2013-09-17 DIAGNOSIS — O0930 Supervision of pregnancy with insufficient antenatal care, unspecified trimester: Secondary | ICD-10-CM | POA: Insufficient documentation

## 2013-09-17 DIAGNOSIS — O99119 Other diseases of the blood and blood-forming organs and certain disorders involving the immune mechanism complicating pregnancy, unspecified trimester: Principal | ICD-10-CM

## 2013-09-17 DIAGNOSIS — O99891 Other specified diseases and conditions complicating pregnancy: Secondary | ICD-10-CM

## 2013-09-17 NOTE — Progress Notes (Signed)
[facility] ultrasound  Indication: 28 yr old [id number] at 5955w5d with suspected ITP and previous fetus with acrania for fetal ultrasound.  Findings: 1. Single intrauterine pregnancy. 2. Estimated fetal weight is in the 60th%. 3. Posterior placenta without evidence of previa. 4. Normal amniotic fluid volume. 5. Normal transabdominal cervical length. 6. The limited anatomy survey is normal. Any anatomy not evaluated on today's exam was evaluated on the previous exam.  Recommendations: 1. Appropriate fetal growth. 2. Fetal anatomic survey is complete. 3. Susptected ITP: - previously counseled - recommend follow platelet levels as recommended - consider treatment closer to delivery if platelets <100,000 to preserve opportunity for regional anesthesia 4. Previous C section: - previously counseled 5. Previous fetus with acrania: - recommend 4mg  daily of folic acid prior to conception and during first trimester of future pregnancies given increased recurrence risk of neural tube defects 6. Recommend follow up  ultrasounds as clinically indicated: - if is on long term steroid therapy recommend follow up fetal growth in 4-6 weeks  Eulis FosterKristen Amado Andal, MD

## 2013-09-22 ENCOUNTER — Encounter: Payer: Medicaid Other | Admitting: Obstetrics

## 2013-10-14 ENCOUNTER — Ambulatory Visit (INDEPENDENT_AMBULATORY_CARE_PROVIDER_SITE_OTHER): Payer: Medicaid Other | Admitting: Obstetrics

## 2013-10-14 DIAGNOSIS — Z862 Personal history of diseases of the blood and blood-forming organs and certain disorders involving the immune mechanism: Secondary | ICD-10-CM

## 2013-10-14 DIAGNOSIS — Z3480 Encounter for supervision of other normal pregnancy, unspecified trimester: Secondary | ICD-10-CM

## 2013-10-15 ENCOUNTER — Encounter: Payer: Self-pay | Admitting: Obstetrics

## 2013-10-15 NOTE — Progress Notes (Signed)
Subjective:    Maisie Fusangina N Lefkowitz is a 28 y.o. female being seen today for her obstetrical visit. She is at 3539w5d gestation. Patient reports no complaints. Fetal movement: normal.  Problem List Items Addressed This Visit   None     Patient Active Problem List   Diagnosis Date Noted  . Unspecified high-risk pregnancy 08/04/2013  . H/O thrombocytopenia 08/04/2013  . GERD without esophagitis 08/04/2013   Objective:    LMP 03/13/2013 FHT:  150 BPM  Uterine Size: size equals dates  Presentation: unsure     Assessment:    Pregnancy @ 5139w5d weeks   Plan:     labs reviewed, problem list updated Consent signed. GBS sent TDAP offered  Rhogam given for RH negative Pediatrician: discussed. Infant feeding: plans to breastfeed. Maternity leave: discussed. Cigarette smoking: quit date 01 /2015. No orders of the defined types were placed in this encounter.   No orders of the defined types were placed in this encounter.   Follow up in 2 Weeks.

## 2013-10-25 ENCOUNTER — Ambulatory Visit (INDEPENDENT_AMBULATORY_CARE_PROVIDER_SITE_OTHER): Payer: Medicaid Other | Admitting: Obstetrics

## 2013-10-25 VITALS — BP 135/88 | HR 80 | Temp 97.9°F | Wt 136.0 lb

## 2013-10-25 DIAGNOSIS — G4700 Insomnia, unspecified: Secondary | ICD-10-CM

## 2013-10-25 DIAGNOSIS — O0990 Supervision of high risk pregnancy, unspecified, unspecified trimester: Secondary | ICD-10-CM

## 2013-10-25 DIAGNOSIS — O99119 Other diseases of the blood and blood-forming organs and certain disorders involving the immune mechanism complicating pregnancy, unspecified trimester: Secondary | ICD-10-CM

## 2013-10-25 DIAGNOSIS — D696 Thrombocytopenia, unspecified: Secondary | ICD-10-CM

## 2013-10-25 DIAGNOSIS — Z3483 Encounter for supervision of other normal pregnancy, third trimester: Secondary | ICD-10-CM

## 2013-10-25 DIAGNOSIS — Z3480 Encounter for supervision of other normal pregnancy, unspecified trimester: Secondary | ICD-10-CM

## 2013-10-25 DIAGNOSIS — D689 Coagulation defect, unspecified: Secondary | ICD-10-CM

## 2013-10-25 DIAGNOSIS — O99113 Other diseases of the blood and blood-forming organs and certain disorders involving the immune mechanism complicating pregnancy, third trimester: Secondary | ICD-10-CM

## 2013-10-25 LAB — POCT URINALYSIS DIPSTICK
Bilirubin, UA: NEGATIVE
Blood, UA: NEGATIVE
Glucose, UA: NEGATIVE
Ketones, UA: NEGATIVE
Leukocytes, UA: NEGATIVE
Nitrite, UA: NEGATIVE
Spec Grav, UA: 1.015
Urobilinogen, UA: 1
pH, UA: 7

## 2013-10-25 MED ORDER — ZOLPIDEM TARTRATE 10 MG PO TABS: 5.0000 mg | ORAL_TABLET | Freq: Every evening | ORAL | Status: DC | PRN

## 2013-10-26 LAB — CBC
HCT: 35.4 % — ABNORMAL LOW (ref 36.0–46.0)
Hemoglobin: 12.2 g/dL (ref 12.0–15.0)
MCH: 30.1 pg (ref 26.0–34.0)
MCHC: 34.5 g/dL (ref 30.0–36.0)
MCV: 87.4 fL (ref 78.0–100.0)
Platelets: 76 10*3/uL — ABNORMAL LOW (ref 150–400)
RBC: 4.05 MIL/uL (ref 3.87–5.11)
RDW: 14.1 % (ref 11.5–15.5)
WBC: 5.6 10*3/uL (ref 4.0–10.5)

## 2013-10-28 ENCOUNTER — Encounter: Payer: Self-pay | Admitting: Obstetrics

## 2013-10-28 NOTE — Progress Notes (Signed)
Subjective:    Maisie Fusangina N Labell is a 28 y.o. female being seen today for her obstetrical visit. She is at 4032w4d gestation. Patient reports difficulty sleeping.  Fetal movement: normal.  Problem List Items Addressed This Visit   Unspecified high-risk pregnancy - Primary    Other Visit Diagnoses   Encounter for supervision of other normal pregnancy in third trimester        Relevant Orders       POCT urinalysis dipstick (Completed)    Thrombocytopenia complicating pregnancy, third trimester        Relevant Orders       CBC (Completed)       AMB Referral to Maternal Fetal Medicine (MFM)       US OB Follow Up    Insomnia        Relevant Medications       zolpidem (AMBIEN) tablet      Patient Active Problem List   Diagnosis Date Noted  . Unspecified high-risk pregnancy 08/04/2013  . H/O thrombocytopenia 08/04/2013  . GERD without esophagitis 08/04/2013   Objective:    BP 135/88  Pulse 80  Temp(Src) 97.9 F (36.6 C)  Wt 136 lb (61.689 kg)  LMP 03/13/2013 FHT:  150 BPM  Uterine Size: size equals dates  Presentation: unsure     Assessment:    Pregnancy @ 8432w4d weeks   Plan:     labs reviewed, problem list updated Consent signed. GBS sent TDAP offered  Rhogam given for RH negative Pediatrician: discussed. Infant feeding: plans to breastfeed. Maternity leave: discussed. Cigarette smoking: quit date 1 / 2015. Orders Placed This Encounter  Procedures  . US OB Follow Up    Standing Status: Future     Number of Occurrences:      Standing Expiration Date: 12/27/2014    Order Specific Question:  Reason for Exam (SYMPTOM  OR DIAGNOSIS REQUIRED)    Answer:  F/U, GROWTH    Order Specific Question:  Preferred imaging location?    Answer:  [facility]  . CBC  . AMB Referral to Maternal Fetal Medicine (MFM)    Referral Priority:  Routine    Referral Type:  Consultation    Number of Visits Requested:  1  . POCT urinalysis dipstick   Meds ordered this encounter   Medications  . zolpidem (AMBIEN) 10 MG tablet    Sig: Take 0.5 tablets (5 mg total) by mouth at bedtime as needed for sleep.    Dispense:  30 tablet    Refill:  3   Follow up in 2 Weeks.

## 2013-11-04 ENCOUNTER — Ambulatory Visit (HOSPITAL_COMMUNITY)
Admission: RE | Admit: 2013-11-04 | Discharge: 2013-11-04 | Disposition: A | Discharge status: Home / Self Care | Payer: Medicaid Other | Source: Ambulatory Visit | Attending: Obstetrics | Admitting: Obstetrics

## 2013-11-04 DIAGNOSIS — O99113 Other diseases of the blood and blood-forming organs and certain disorders involving the immune mechanism complicating pregnancy, third trimester: Secondary | ICD-10-CM

## 2013-11-04 DIAGNOSIS — O99119 Other diseases of the blood and blood-forming organs and certain disorders involving the immune mechanism complicating pregnancy, unspecified trimester: Principal | ICD-10-CM

## 2013-11-04 DIAGNOSIS — Z3689 Encounter for other specified antenatal screening: Secondary | ICD-10-CM | POA: Insufficient documentation

## 2013-11-04 DIAGNOSIS — D689 Coagulation defect, unspecified: Secondary | ICD-10-CM | POA: Diagnosis not present

## 2013-11-04 DIAGNOSIS — D696 Thrombocytopenia, unspecified: Secondary | ICD-10-CM | POA: Insufficient documentation

## 2013-11-05 ENCOUNTER — Telehealth: Payer: Self-pay | Admitting: *Deleted

## 2013-11-05 NOTE — Telephone Encounter (Signed)
Patient requesting a refill on Ambien. Patient states she needs the prescription rewritten. Patient states it is written for her to take half a tablet as needed but states half a tablet is not helping her sleep so she has been taking a whole tablet.

## 2013-11-08 ENCOUNTER — Ambulatory Visit (INDEPENDENT_AMBULATORY_CARE_PROVIDER_SITE_OTHER): Payer: Medicaid Other | Admitting: Obstetrics

## 2013-11-08 VITALS — BP 114/68 | HR 75 | Temp 97.6°F | Wt 140.0 lb

## 2013-11-08 DIAGNOSIS — O0990 Supervision of high risk pregnancy, unspecified, unspecified trimester: Secondary | ICD-10-CM

## 2013-11-08 DIAGNOSIS — G4700 Insomnia, unspecified: Secondary | ICD-10-CM

## 2013-11-08 DIAGNOSIS — Z3480 Encounter for supervision of other normal pregnancy, unspecified trimester: Secondary | ICD-10-CM

## 2013-11-08 DIAGNOSIS — Z862 Personal history of diseases of the blood and blood-forming organs and certain disorders involving the immune mechanism: Secondary | ICD-10-CM

## 2013-11-08 DIAGNOSIS — Z3483 Encounter for supervision of other normal pregnancy, third trimester: Secondary | ICD-10-CM

## 2013-11-08 MED ORDER — ZOLPIDEM TARTRATE 10 MG PO TABS: 10.0000 mg | ORAL_TABLET | Freq: Every evening | ORAL | Status: DC | PRN

## 2013-11-09 ENCOUNTER — Encounter: Payer: Self-pay | Admitting: Obstetrics

## 2013-11-09 NOTE — Telephone Encounter (Signed)
Patient was seen in the office on 11-08-13 and spoke with Dr.[name].

## 2013-11-09 NOTE — Progress Notes (Signed)
Subjective:    Emily Morse is a 28 y.o. female being seen today for her obstetrical visit. She is at 2942w2d gestation. Patient reports no complaints. Fetal movement: normal.  Problem List Items Addressed This Visit   None    Visit Diagnoses   Encounter for supervision of other normal pregnancy in third trimester    -  Primary    Relevant Orders       POCT urinalysis dipstick       Strep B DNA probe    Insomnia        Relevant Medications       zolpidem (AMBIEN) tablet      Patient Active Problem List   Diagnosis Date Noted  . Unspecified high-risk pregnancy 08/04/2013  . H/O thrombocytopenia 08/04/2013  . GERD without esophagitis 08/04/2013   Objective:    BP 114/68  Pulse 75  Temp(Src) 97.6 F (36.4 C)  Wt 140 lb (63.504 kg)  LMP 03/13/2013 FHT:  150 BPM  Uterine Size: size equals dates  Presentation: unsure     Assessment:    Pregnancy @ 4942w2d weeks   Plan:     labs reviewed, problem list updated Consent signed. GBS sent TDAP offered  Rhogam given for RH negative Pediatrician: discussed. Infant feeding: plans to breastfeed. Maternity leave: not discussed. Cigarette smoking: quit date January 2015. Orders Placed This Encounter  Procedures  . Strep B DNA probe  . POCT urinalysis dipstick   Meds ordered this encounter  Medications  . zolpidem (AMBIEN) 10 MG tablet    Sig: Take 1 tablet (10 mg total) by mouth at bedtime as needed for sleep.    Dispense:  30 tablet    Refill:  3   Follow up in 1 Week.

## 2013-11-10 LAB — STREP B DNA PROBE: GBSP: DETECTED

## 2013-11-18 ENCOUNTER — Ambulatory Visit (INDEPENDENT_AMBULATORY_CARE_PROVIDER_SITE_OTHER): Payer: Medicaid Other | Admitting: Obstetrics

## 2013-11-18 ENCOUNTER — Encounter: Payer: Self-pay | Admitting: Obstetrics

## 2013-11-18 VITALS — BP 121/72 | HR 74 | Temp 98.5°F | Wt 140.0 lb

## 2013-11-18 DIAGNOSIS — O99119 Other diseases of the blood and blood-forming organs and certain disorders involving the immune mechanism complicating pregnancy, unspecified trimester: Secondary | ICD-10-CM

## 2013-11-18 DIAGNOSIS — O99113 Other diseases of the blood and blood-forming organs and certain disorders involving the immune mechanism complicating pregnancy, third trimester: Secondary | ICD-10-CM

## 2013-11-18 DIAGNOSIS — Z3480 Encounter for supervision of other normal pregnancy, unspecified trimester: Secondary | ICD-10-CM

## 2013-11-18 DIAGNOSIS — D689 Coagulation defect, unspecified: Secondary | ICD-10-CM

## 2013-11-18 DIAGNOSIS — O0990 Supervision of high risk pregnancy, unspecified, unspecified trimester: Secondary | ICD-10-CM

## 2013-11-18 DIAGNOSIS — Z3483 Encounter for supervision of other normal pregnancy, third trimester: Secondary | ICD-10-CM

## 2013-11-18 DIAGNOSIS — D696 Thrombocytopenia, unspecified: Secondary | ICD-10-CM | POA: Insufficient documentation

## 2013-11-18 NOTE — Progress Notes (Signed)
Subjective:    Emily Morse is a 28 y.o. female being seen today for her obstetrical visit. She is at 5461w4d gestation. Patient reports no complaints. Fetal movement: normal.  Problem List Items Addressed This Visit   Thrombocytopenia complicating pregnancy   Unspecified high-risk pregnancy - Primary    Other Visit Diagnoses   Encounter for supervision of other normal pregnancy in third trimester        Relevant Orders       POCT urinalysis dipstick      Patient Active Problem List   Diagnosis Date Noted  . Thrombocytopenia complicating pregnancy 11/18/2013  . Unspecified high-risk pregnancy 08/04/2013  . H/O thrombocytopenia 08/04/2013  . GERD without esophagitis 08/04/2013   Objective:    BP 121/72  Pulse 74  Temp(Src) 98.5 F (36.9 C)  Wt 140 lb (63.504 kg)  LMP 03/13/2013 FHT:  150 BPM  Uterine Size: size equals dates  Presentation: unsure     Assessment:    Pregnancy @ 4861w4d weeks   Gestational Thrombocytopenia  Plan:    CBC today   labs reviewed, problem list updated Consent signed. GBS sent TDAP offered  Rhogam given for RH negative Pediatrician: discussed. Infant feeding: plans to breastfeed. Maternity leave: discussed. Cigarette smoking: quit date January 2015. Orders Placed This Encounter  Procedures  . POCT urinalysis dipstick   No orders of the defined types were placed in this encounter.   Follow up in 1 Week.

## 2013-11-19 LAB — CBC
HCT: 34.6 % — ABNORMAL LOW (ref 36.0–46.0)
Hemoglobin: 12 g/dL (ref 12.0–15.0)
MCH: 30.8 pg (ref 26.0–34.0)
MCHC: 34.7 g/dL (ref 30.0–36.0)
MCV: 88.7 fL (ref 78.0–100.0)
Platelets: 76 10*3/uL — ABNORMAL LOW (ref 150–400)
RBC: 3.9 MIL/uL (ref 3.87–5.11)
RDW: 13.7 % (ref 11.5–15.5)
WBC: 5.1 10*3/uL (ref 4.0–10.5)

## 2013-11-22 LAB — POCT URINALYSIS DIPSTICK
Bilirubin, UA: NEGATIVE
Blood, UA: NEGATIVE
Glucose, UA: NEGATIVE
Ketones, UA: NEGATIVE
Leukocytes, UA: NEGATIVE
Nitrite, UA: NEGATIVE
Protein, UA: NEGATIVE
Spec Grav, UA: 1.01
Urobilinogen, UA: NEGATIVE
pH, UA: 7

## 2013-11-25 ENCOUNTER — Encounter: Payer: Medicaid Other | Admitting: Obstetrics

## 2013-11-29 ENCOUNTER — Encounter: Payer: Self-pay | Admitting: Obstetrics

## 2013-11-29 ENCOUNTER — Ambulatory Visit (INDEPENDENT_AMBULATORY_CARE_PROVIDER_SITE_OTHER): Payer: Medicaid Other | Admitting: Obstetrics

## 2013-11-29 VITALS — BP 118/80 | Temp 98.6°F | Wt 144.0 lb

## 2013-11-29 DIAGNOSIS — O99119 Other diseases of the blood and blood-forming organs and certain disorders involving the immune mechanism complicating pregnancy, unspecified trimester: Secondary | ICD-10-CM

## 2013-11-29 DIAGNOSIS — O99113 Other diseases of the blood and blood-forming organs and certain disorders involving the immune mechanism complicating pregnancy, third trimester: Principal | ICD-10-CM

## 2013-11-29 DIAGNOSIS — D689 Coagulation defect, unspecified: Secondary | ICD-10-CM

## 2013-11-29 DIAGNOSIS — D696 Thrombocytopenia, unspecified: Secondary | ICD-10-CM

## 2013-11-29 DIAGNOSIS — Z3483 Encounter for supervision of other normal pregnancy, third trimester: Secondary | ICD-10-CM

## 2013-11-29 DIAGNOSIS — Z3480 Encounter for supervision of other normal pregnancy, unspecified trimester: Secondary | ICD-10-CM

## 2013-11-29 MED ORDER — PREDNISONE 10 MG PO TABS: 40.0000 mg | ORAL_TABLET | Freq: Every day | ORAL | Status: DC

## 2013-11-29 MED ORDER — PREDNISONE 10 MG PO TABS: 10.0000 mg | ORAL_TABLET | Freq: Every day | ORAL | Status: DC

## 2013-11-29 NOTE — Progress Notes (Signed)
Subjective:    Emily Morse is a 28 y.o. female being seen today for her obstetrical visit. She is at [redacted]w[redacted]d gestation. Patient reports no complaints. Fetal movement: normal.  Problem List Items Addressed This Visit   Thrombocytopenia complicating pregnancy - Primary   Relevant Medications      predniSONE (DELTASONE) tablet   Other Relevant Orders      CBC    Other Visit Diagnoses   Encounter for supervision of other normal pregnancy in third trimester        Relevant Orders       POCT urinalysis dipstick      Patient Active Problem List   Diagnosis Date Noted  . Thrombocytopenia complicating pregnancy 11/18/2013  . Unspecified high-risk pregnancy 08/04/2013  . H/O thrombocytopenia 08/04/2013  . GERD without esophagitis 08/04/2013    Objective:    BP 118/80  Temp(Src) 98.6 F (37 C)  Wt 144 lb (65.318 kg)  LMP 03/13/2013 FHT: 150 BPM  Uterine Size: size equals dates  Presentations: cephalic  Pelvic Exam: Deferred    Assessment:    Pregnancy @ [redacted]w[redacted]d weeks   Plan:   Plans for delivery: C/Section scheduled; labs reviewed; problem list updated Counseling: Consent signed. Infant feeding: plans to breastfeed. Cigarette smoking: quit 1 / 2015. L&D discussion: symptoms of labor, discussed when to call, discussed what number to call, anesthetic/analgesic options reviewed and delivering clinician:  plans Physician. Postpartum supports and preparation: circumcision discussed and contraception plans discussed.  Follow up in 1 Week.

## 2013-11-30 LAB — POCT URINALYSIS DIPSTICK
Bilirubin, UA: NEGATIVE
Blood, UA: NEGATIVE
Glucose, UA: NEGATIVE
Ketones, UA: NEGATIVE
Leukocytes, UA: NEGATIVE
Nitrite, UA: NEGATIVE
Protein, UA: NEGATIVE
Spec Grav, UA: <=1.005
Urobilinogen, UA: NEGATIVE
pH, UA: 7

## 2013-11-30 LAB — CBC
HCT: 36.8 % (ref 36.0–46.0)
Hemoglobin: 12.7 g/dL (ref 12.0–15.0)
MCH: 30.4 pg (ref 26.0–34.0)
MCHC: 34.5 g/dL (ref 30.0–36.0)
MCV: 88 fL (ref 78.0–100.0)
Platelets: 79 10*3/uL — ABNORMAL LOW (ref 150–400)
RBC: 4.18 MIL/uL (ref 3.87–5.11)
RDW: 14 % (ref 11.5–15.5)
WBC: 6 10*3/uL (ref 4.0–10.5)

## 2013-12-05 ENCOUNTER — Inpatient Hospital Stay (HOSPITAL_COMMUNITY)
Admission: AD | Admit: 2013-12-05 | Discharge: 2013-12-05 | Disposition: A | Discharge status: Home / Self Care | Payer: Medicaid Other | Source: Ambulatory Visit | Attending: Obstetrics | Admitting: Obstetrics

## 2013-12-05 ENCOUNTER — Encounter (HOSPITAL_COMMUNITY): Payer: Self-pay | Admitting: *Deleted

## 2013-12-05 DIAGNOSIS — O99891 Other specified diseases and conditions complicating pregnancy: Secondary | ICD-10-CM | POA: Diagnosis not present

## 2013-12-05 DIAGNOSIS — O9989 Other specified diseases and conditions complicating pregnancy, childbirth and the puerperium: Secondary | ICD-10-CM

## 2013-12-05 DIAGNOSIS — O479 False labor, unspecified: Secondary | ICD-10-CM | POA: Diagnosis present

## 2013-12-05 NOTE — Discharge Instructions (Signed)

## 2013-12-05 NOTE — MAU Note (Signed)
C/o losing her mucous plug; c/o ucs about 0700 this AM;

## 2013-12-06 ENCOUNTER — Encounter: Payer: Medicaid Other | Admitting: Obstetrics

## 2013-12-06 ENCOUNTER — Encounter (HOSPITAL_COMMUNITY): Payer: Self-pay | Admitting: *Deleted

## 2013-12-06 ENCOUNTER — Encounter: Payer: Self-pay | Admitting: *Deleted

## 2013-12-06 ENCOUNTER — Inpatient Hospital Stay (HOSPITAL_COMMUNITY)
Admission: AD | Admit: 2013-12-06 | Discharge: 2013-12-06 | Disposition: A | Discharge status: Home / Self Care | Payer: Medicaid Other | Source: Ambulatory Visit | Attending: Obstetrics & Gynecology | Admitting: Obstetrics & Gynecology

## 2013-12-06 DIAGNOSIS — O479 False labor, unspecified: Secondary | ICD-10-CM | POA: Diagnosis not present

## 2013-12-06 NOTE — MAU Note (Signed)
Pt states that she has been losing her mucous plug since yesterday and contracting 10-15 minutes apart.

## 2013-12-06 NOTE — MAU Note (Signed)
Pt seen in MAU yesterday, DC'd home.  Pt returned today, states she continues to have uc's today, lost mucus plug.  Has noted some blood in toilet, no LOF.  States her MD told to come to hospital, might have C/S today.

## 2013-12-07 ENCOUNTER — Encounter (HOSPITAL_COMMUNITY): Payer: Self-pay | Admitting: *Deleted

## 2013-12-07 ENCOUNTER — Inpatient Hospital Stay (HOSPITAL_COMMUNITY): Payer: Medicaid Other | Admitting: Anesthesiology

## 2013-12-07 ENCOUNTER — Inpatient Hospital Stay (HOSPITAL_COMMUNITY)
Admission: AD | Admit: 2013-12-07 | Discharge: 2013-12-10 | DRG: 765 | Disposition: A | Discharge status: Home / Self Care | Payer: Medicaid Other | Source: Ambulatory Visit | Attending: Obstetrics | Admitting: Obstetrics

## 2013-12-07 ENCOUNTER — Encounter (HOSPITAL_COMMUNITY): Payer: Medicaid Other | Admitting: Anesthesiology

## 2013-12-07 ENCOUNTER — Encounter (HOSPITAL_COMMUNITY)
Admission: AD | Disposition: A | Discharge status: Home / Self Care | Payer: Self-pay | Source: Ambulatory Visit | Attending: Obstetrics

## 2013-12-07 DIAGNOSIS — O34219 Maternal care for unspecified type scar from previous cesarean delivery: Principal | ICD-10-CM | POA: Diagnosis present

## 2013-12-07 DIAGNOSIS — O9081 Anemia of the puerperium: Secondary | ICD-10-CM | POA: Diagnosis not present

## 2013-12-07 DIAGNOSIS — D689 Coagulation defect, unspecified: Secondary | ICD-10-CM | POA: Diagnosis present

## 2013-12-07 DIAGNOSIS — IMO0001 Reserved for inherently not codable concepts without codable children: Secondary | ICD-10-CM

## 2013-12-07 DIAGNOSIS — O479 False labor, unspecified: Secondary | ICD-10-CM | POA: Diagnosis present

## 2013-12-07 DIAGNOSIS — Z87891 Personal history of nicotine dependence: Secondary | ICD-10-CM

## 2013-12-07 DIAGNOSIS — D696 Thrombocytopenia, unspecified: Secondary | ICD-10-CM | POA: Diagnosis present

## 2013-12-07 DIAGNOSIS — O9912 Other diseases of the blood and blood-forming organs and certain disorders involving the immune mechanism complicating childbirth: Secondary | ICD-10-CM

## 2013-12-07 DIAGNOSIS — Z98891 History of uterine scar from previous surgery: Secondary | ICD-10-CM

## 2013-12-07 DIAGNOSIS — K219 Gastro-esophageal reflux disease without esophagitis: Secondary | ICD-10-CM | POA: Diagnosis present

## 2013-12-07 LAB — RPR

## 2013-12-07 LAB — CBC
HCT: 36.6 % (ref 36.0–46.0)
Hemoglobin: 12.7 g/dL (ref 12.0–15.0)
MCH: 31.6 pg (ref 26.0–34.0)
MCHC: 34.7 g/dL (ref 30.0–36.0)
MCV: 91 fL (ref 78.0–100.0)
Platelets: 65 10*3/uL — ABNORMAL LOW (ref 150–400)
RBC: 4.02 MIL/uL (ref 3.87–5.11)
RDW: 14.4 % (ref 11.5–15.5)
WBC: 8.6 10*3/uL (ref 4.0–10.5)

## 2013-12-07 LAB — PREPARE RBC (CROSSMATCH)

## 2013-12-07 SURGERY — Surgical Case
Anesthesia: General

## 2013-12-07 MED ORDER — DIPHENHYDRAMINE HCL 50 MG/ML IJ SOLN: 12.5000 mg | Freq: Four times a day (QID) | INTRAMUSCULAR | Status: DC | PRN

## 2013-12-07 MED ORDER — SIMETHICONE 80 MG PO CHEW
80.0000 mg | CHEWABLE_TABLET | ORAL | Status: DC | PRN
  Filled 2013-12-07: qty 1

## 2013-12-07 MED ORDER — KETOROLAC TROMETHAMINE 30 MG/ML IJ SOLN: 15.0000 mg | Freq: Once | INTRAMUSCULAR | Status: DC | PRN

## 2013-12-07 MED ORDER — HYDROMORPHONE 0.3 MG/ML IV SOLN
INTRAVENOUS | Status: DC
  Administered 2013-12-07: 14:00:00 via INTRAVENOUS
  Administered 2013-12-07: 0.6 mg via INTRAVENOUS
  Filled 2013-12-07: qty 25

## 2013-12-07 MED ORDER — FLEET ENEMA 7-19 GM/118ML RE ENEM: 1.0000 | ENEMA | RECTAL | Status: DC | PRN

## 2013-12-07 MED ORDER — OXYTOCIN 10 UNIT/ML IJ SOLN
40.0000 [IU] | INTRAVENOUS | Status: DC | PRN
  Administered 2013-12-07: 40 [IU] via INTRAVENOUS

## 2013-12-07 MED ORDER — MENTHOL 3 MG MT LOZG
1.0000 | LOZENGE | OROMUCOSAL | Status: DC | PRN
  Administered 2013-12-09: 3 mg via ORAL
  Filled 2013-12-07: qty 9

## 2013-12-07 MED ORDER — MEPERIDINE HCL 25 MG/ML IJ SOLN: 6.2500 mg | INTRAMUSCULAR | Status: DC | PRN

## 2013-12-07 MED ORDER — FENTANYL CITRATE 0.05 MG/ML IJ SOLN
INTRAMUSCULAR | Status: AC
  Filled 2013-12-07: qty 5

## 2013-12-07 MED ORDER — DEXTROSE 5 % IV SOLN
2.0000 g | Freq: Four times a day (QID) | INTRAVENOUS | Status: AC
  Administered 2013-12-07 – 2013-12-09 (×8): 2 g via INTRAVENOUS
  Filled 2013-12-07 (×8): qty 2

## 2013-12-07 MED ORDER — HYDROMORPHONE HCL PF 1 MG/ML IJ SOLN
0.2500 mg | INTRAMUSCULAR | Status: DC | PRN
  Administered 2013-12-07 (×2): 0.5 mg via INTRAVENOUS

## 2013-12-07 MED ORDER — PROPOFOL 10 MG/ML IV BOLUS
INTRAVENOUS | Status: DC | PRN
  Administered 2013-12-07: 150 mg via INTRAVENOUS
  Administered 2013-12-07: 30 mg via INTRAVENOUS

## 2013-12-07 MED ORDER — LACTATED RINGERS IV SOLN
INTRAVENOUS | Status: DC | PRN
  Administered 2013-12-07 (×2): via INTRAVENOUS

## 2013-12-07 MED ORDER — ONDANSETRON HCL 4 MG/2ML IJ SOLN
INTRAMUSCULAR | Status: AC
  Filled 2013-12-07: qty 2

## 2013-12-07 MED ORDER — DIPHENHYDRAMINE HCL 50 MG/ML IJ SOLN: 12.5000 mg | INTRAMUSCULAR | Status: DC | PRN

## 2013-12-07 MED ORDER — DIPHENHYDRAMINE HCL 12.5 MG/5ML PO ELIX
12.5000 mg | ORAL_SOLUTION | Freq: Four times a day (QID) | ORAL | Status: DC | PRN
  Filled 2013-12-07: qty 5

## 2013-12-07 MED ORDER — HYDROMORPHONE HCL PF 1 MG/ML IJ SOLN
INTRAMUSCULAR | Status: AC
  Filled 2013-12-07: qty 1

## 2013-12-07 MED ORDER — ZOLPIDEM TARTRATE 5 MG PO TABS: 5.0000 mg | ORAL_TABLET | Freq: Every evening | ORAL | Status: DC | PRN

## 2013-12-07 MED ORDER — PROMETHAZINE HCL 25 MG/ML IJ SOLN: 25.0000 mg | Freq: Four times a day (QID) | INTRAMUSCULAR | Status: DC | PRN

## 2013-12-07 MED ORDER — FENTANYL CITRATE 0.05 MG/ML IJ SOLN
INTRAMUSCULAR | Status: DC | PRN
  Administered 2013-12-07 (×2): 50 ug via INTRAVENOUS
  Administered 2013-12-07: 250 ug via INTRAVENOUS
  Administered 2013-12-07: 50 ug via INTRAVENOUS
  Administered 2013-12-07: 100 ug via INTRAVENOUS

## 2013-12-07 MED ORDER — PROPOFOL 10 MG/ML IV EMUL
INTRAVENOUS | Status: AC
  Filled 2013-12-07: qty 20

## 2013-12-07 MED ORDER — PROMETHAZINE HCL 25 MG/ML IJ SOLN: 6.2500 mg | INTRAMUSCULAR | Status: DC | PRN

## 2013-12-07 MED ORDER — FENTANYL 2.5 MCG/ML BUPIVACAINE 1/10 % EPIDURAL INFUSION (WH - ANES): 14.0000 mL/h | INTRAMUSCULAR | Status: DC | PRN

## 2013-12-07 MED ORDER — HYDROMORPHONE HCL PF 1 MG/ML IJ SOLN
0.2500 mg | INTRAMUSCULAR | Status: DC | PRN
  Administered 2013-12-07 (×4): 0.5 mg via INTRAVENOUS

## 2013-12-07 MED ORDER — SODIUM CHLORIDE 0.9 % IJ SOLN: 9.0000 mL | INTRAMUSCULAR | Status: DC | PRN

## 2013-12-07 MED ORDER — LACTATED RINGERS IV SOLN: INTRAVENOUS | Status: DC

## 2013-12-07 MED ORDER — ACETAMINOPHEN 325 MG PO TABS: 650.0000 mg | ORAL_TABLET | ORAL | Status: DC | PRN

## 2013-12-07 MED ORDER — WITCH HAZEL-GLYCERIN EX PADS: 1.0000 "application " | MEDICATED_PAD | CUTANEOUS | Status: DC | PRN

## 2013-12-07 MED ORDER — SCOPOLAMINE 1 MG/3DAYS TD PT72
MEDICATED_PATCH | TRANSDERMAL | Status: AC
  Filled 2013-12-07: qty 1

## 2013-12-07 MED ORDER — PHENYLEPHRINE 40 MCG/ML (10ML) SYRINGE FOR IV PUSH (FOR BLOOD PRESSURE SUPPORT)
80.0000 ug | PREFILLED_SYRINGE | INTRAVENOUS | Status: DC | PRN
  Filled 2013-12-07: qty 2

## 2013-12-07 MED ORDER — AMPICILLIN SODIUM 2 G IJ SOLR
2.0000 g | Freq: Once | INTRAMUSCULAR | Status: AC
  Administered 2013-12-07: 2 g via INTRAVENOUS
  Filled 2013-12-07: qty 2000

## 2013-12-07 MED ORDER — SIMETHICONE 80 MG PO CHEW
80.0000 mg | CHEWABLE_TABLET | ORAL | Status: DC
  Administered 2013-12-07 – 2013-12-09 (×3): 80 mg via ORAL
  Filled 2013-12-07 (×3): qty 1

## 2013-12-07 MED ORDER — IBUPROFEN 600 MG PO TABS
600.0000 mg | ORAL_TABLET | Freq: Four times a day (QID) | ORAL | Status: DC
  Filled 2013-12-07: qty 1

## 2013-12-07 MED ORDER — LACTATED RINGERS IV SOLN: 500.0000 mL | INTRAVENOUS | Status: DC | PRN

## 2013-12-07 MED ORDER — SODIUM CHLORIDE 0.9 % IR SOLN
Status: DC | PRN
  Administered 2013-12-07: 1000 mL

## 2013-12-07 MED ORDER — SIMETHICONE 80 MG PO CHEW
80.0000 mg | CHEWABLE_TABLET | Freq: Three times a day (TID) | ORAL | Status: DC
  Administered 2013-12-08 – 2013-12-10 (×7): 80 mg via ORAL
  Filled 2013-12-07 (×7): qty 1

## 2013-12-07 MED ORDER — SCOPOLAMINE 1 MG/3DAYS TD PT72
MEDICATED_PATCH | TRANSDERMAL | Status: DC | PRN
  Administered 2013-12-07: 1 via TRANSDERMAL

## 2013-12-07 MED ORDER — MIDAZOLAM HCL 2 MG/2ML IJ SOLN: 0.5000 mg | Freq: Once | INTRAMUSCULAR | Status: DC | PRN

## 2013-12-07 MED ORDER — LIDOCAINE HCL (PF) 1 % IJ SOLN
30.0000 mL | INTRAMUSCULAR | Status: DC | PRN
  Filled 2013-12-07: qty 30

## 2013-12-07 MED ORDER — OXYTOCIN 10 UNIT/ML IJ SOLN
INTRAMUSCULAR | Status: AC
  Filled 2013-12-07: qty 4

## 2013-12-07 MED ORDER — OXYTOCIN 40 UNITS IN LACTATED RINGERS INFUSION - SIMPLE MED: 62.5000 mL/h | INTRAVENOUS | Status: DC

## 2013-12-07 MED ORDER — LACTATED RINGERS IV SOLN
500.0000 mL | Freq: Once | INTRAVENOUS | Status: DC
Start: 2013-12-07 — End: 2013-12-07

## 2013-12-07 MED ORDER — SENNOSIDES-DOCUSATE SODIUM 8.6-50 MG PO TABS
2.0000 | ORAL_TABLET | ORAL | Status: DC
  Administered 2013-12-07 – 2013-12-09 (×3): 2 via ORAL
  Filled 2013-12-07 (×3): qty 2

## 2013-12-07 MED ORDER — DIPHENHYDRAMINE HCL 25 MG PO CAPS: 25.0000 mg | ORAL_CAPSULE | Freq: Four times a day (QID) | ORAL | Status: DC | PRN

## 2013-12-07 MED ORDER — NALOXONE HCL 0.4 MG/ML IJ SOLN: 0.4000 mg | INTRAMUSCULAR | Status: DC | PRN

## 2013-12-07 MED ORDER — NALBUPHINE HCL 10 MG/ML IJ SOLN
10.0000 mg | INTRAMUSCULAR | Status: DC | PRN
  Administered 2013-12-07: 10 mg via INTRAVENOUS
  Filled 2013-12-07: qty 1

## 2013-12-07 MED ORDER — SUCCINYLCHOLINE CHLORIDE 20 MG/ML IJ SOLN
INTRAMUSCULAR | Status: DC | PRN
  Administered 2013-12-07: 120 mg via INTRAVENOUS

## 2013-12-07 MED ORDER — MIDAZOLAM HCL 5 MG/5ML IJ SOLN
INTRAMUSCULAR | Status: DC | PRN
  Administered 2013-12-07: 2 mg via INTRAVENOUS

## 2013-12-07 MED ORDER — PRENATAL MULTIVITAMIN CH
1.0000 | ORAL_TABLET | Freq: Every day | ORAL | Status: DC
  Administered 2013-12-08 – 2013-12-10 (×3): 1 via ORAL
  Filled 2013-12-07 (×4): qty 1

## 2013-12-07 MED ORDER — ONDANSETRON HCL 4 MG/2ML IJ SOLN
INTRAMUSCULAR | Status: DC | PRN
  Administered 2013-12-07: 4 mg via INTRAVENOUS

## 2013-12-07 MED ORDER — MIDAZOLAM HCL 2 MG/2ML IJ SOLN
INTRAMUSCULAR | Status: AC
  Filled 2013-12-07: qty 2

## 2013-12-07 MED ORDER — TETANUS-DIPHTH-ACELL PERTUSSIS 5-2.5-18.5 LF-MCG/0.5 IM SUSP
0.5000 mL | Freq: Once | INTRAMUSCULAR | Status: AC
  Administered 2013-12-08: 0.5 mL via INTRAMUSCULAR
  Filled 2013-12-07: qty 0.5

## 2013-12-07 MED ORDER — OXYCODONE-ACETAMINOPHEN 5-325 MG PO TABS
1.0000 | ORAL_TABLET | ORAL | Status: DC | PRN
  Administered 2013-12-08 (×5): 1 via ORAL
  Administered 2013-12-09 (×3): 2 via ORAL
  Administered 2013-12-09: 1 via ORAL
  Administered 2013-12-10 (×3): 2 via ORAL
  Filled 2013-12-07: qty 2
  Filled 2013-12-07 (×5): qty 1
  Filled 2013-12-07 (×4): qty 2
  Filled 2013-12-07: qty 1
  Filled 2013-12-07: qty 2

## 2013-12-07 MED ORDER — ONDANSETRON HCL 4 MG/2ML IJ SOLN: 4.0000 mg | Freq: Four times a day (QID) | INTRAMUSCULAR | Status: DC | PRN

## 2013-12-07 MED ORDER — OXYTOCIN 40 UNITS IN LACTATED RINGERS INFUSION - SIMPLE MED: 62.5000 mL/h | INTRAVENOUS | Status: AC

## 2013-12-07 MED ORDER — NALBUPHINE HCL 10 MG/ML IJ SOLN: 10.0000 mg | Freq: Four times a day (QID) | INTRAMUSCULAR | Status: DC | PRN

## 2013-12-07 MED ORDER — CITRIC ACID-SODIUM CITRATE 334-500 MG/5ML PO SOLN
30.0000 mL | ORAL | Status: DC | PRN
  Administered 2013-12-07: 30 mL via ORAL
  Filled 2013-12-07: qty 15

## 2013-12-07 MED ORDER — ONDANSETRON HCL 4 MG/2ML IJ SOLN: 4.0000 mg | INTRAMUSCULAR | Status: DC | PRN

## 2013-12-07 MED ORDER — OXYTOCIN BOLUS FROM INFUSION: 500.0000 mL | INTRAVENOUS | Status: DC

## 2013-12-07 MED ORDER — LANOLIN HYDROUS EX OINT: 1.0000 "application " | TOPICAL_OINTMENT | CUTANEOUS | Status: DC | PRN

## 2013-12-07 MED ORDER — EPHEDRINE 5 MG/ML INJ
10.0000 mg | INTRAVENOUS | Status: DC | PRN
  Filled 2013-12-07: qty 2

## 2013-12-07 MED ORDER — SUCCINYLCHOLINE CHLORIDE 20 MG/ML IJ SOLN
INTRAMUSCULAR | Status: AC
  Filled 2013-12-07: qty 10

## 2013-12-07 MED ORDER — FENTANYL CITRATE 0.05 MG/ML IJ SOLN
50.0000 ug | Freq: Once | INTRAMUSCULAR | Status: AC
Start: 2013-12-07 — End: 2013-12-07
  Administered 2013-12-07: 50 ug via INTRAVENOUS
  Filled 2013-12-07: qty 2

## 2013-12-07 MED ORDER — ONDANSETRON HCL 4 MG PO TABS: 4.0000 mg | ORAL_TABLET | ORAL | Status: DC | PRN

## 2013-12-07 MED ORDER — IBUPROFEN 600 MG PO TABS: 600.0000 mg | ORAL_TABLET | Freq: Four times a day (QID) | ORAL | Status: DC | PRN

## 2013-12-07 MED ORDER — DIBUCAINE 1 % RE OINT: 1.0000 "application " | TOPICAL_OINTMENT | RECTAL | Status: DC | PRN

## 2013-12-07 MED ORDER — OXYCODONE-ACETAMINOPHEN 5-325 MG PO TABS: 1.0000 | ORAL_TABLET | ORAL | Status: DC | PRN

## 2013-12-07 SURGICAL SUPPLY — 45 items
ADH SKN CLS APL DERMABOND .7 (GAUZE/BANDAGES/DRESSINGS) ×1
BLADE SURG 10 STRL SS (BLADE) ×4 IMPLANT
CANISTER WOUND CARE 500ML ATS (WOUND CARE) IMPLANT
CLAMP CORD UMBIL (MISCELLANEOUS) IMPLANT
CLOTH BEACON ORANGE TIMEOUT ST (SAFETY) ×2 IMPLANT
CONTAINER PREFILL 10% NBF 15ML (MISCELLANEOUS) ×4 IMPLANT
DERMABOND ADVANCED (GAUZE/BANDAGES/DRESSINGS) ×1
DERMABOND ADVANCED .7 DNX12 (GAUZE/BANDAGES/DRESSINGS) ×1 IMPLANT
DRAPE LG THREE QUARTER DISP (DRAPES) IMPLANT
DRSG OPSITE POSTOP 4X10 (GAUZE/BANDAGES/DRESSINGS) ×2 IMPLANT
DRSG VAC ATS LRG SENSATRAC (GAUZE/BANDAGES/DRESSINGS) IMPLANT
DRSG VAC ATS MED SENSATRAC (GAUZE/BANDAGES/DRESSINGS) IMPLANT
DRSG VAC ATS SM SENSATRAC (GAUZE/BANDAGES/DRESSINGS) IMPLANT
DURAPREP 26ML APPLICATOR (WOUND CARE) ×2 IMPLANT
ELECT REM PT RETURN 9FT ADLT (ELECTROSURGICAL) ×2
ELECTRODE REM PT RTRN 9FT ADLT (ELECTROSURGICAL) ×1 IMPLANT
EXTRACTOR VACUUM M CUP 4 TUBE (SUCTIONS) IMPLANT
GLOVE BIO SURGEON STRL SZ8 (GLOVE) ×4 IMPLANT
GOWN STRL REUS W/TWL LRG LVL3 (GOWN DISPOSABLE) ×4 IMPLANT
KIT ABG SYR 3ML LUER SLIP (SYRINGE) IMPLANT
NDL HYPO 25X5/8 SAFETYGLIDE (NEEDLE) ×1 IMPLANT
NEEDLE HYPO 25X5/8 SAFETYGLIDE (NEEDLE) ×2 IMPLANT
NS IRRIG 1000ML POUR BTL (IV SOLUTION) ×2 IMPLANT
PACK C SECTION WH (CUSTOM PROCEDURE TRAY) ×2 IMPLANT
PAD ABD 8X7 1/2 STERILE (GAUZE/BANDAGES/DRESSINGS) ×1 IMPLANT
PAD OB MATERNITY 4.3X12.25 (PERSONAL CARE ITEMS) ×2 IMPLANT
RTRCTR C-SECT PINK 25CM LRG (MISCELLANEOUS) ×2 IMPLANT
STAPLER VISISTAT 35W (STAPLE) IMPLANT
SUT GUT PLAIN 0 CT-3 TAN 27 (SUTURE) IMPLANT
SUT MNCRL 0 VIOLET CTX 36 (SUTURE) ×3 IMPLANT
SUT MNCRL AB 4-0 PS2 18 (SUTURE) IMPLANT
SUT MON AB 2-0 CT1 27 (SUTURE) ×2 IMPLANT
SUT MON AB 3-0 SH 27 (SUTURE)
SUT MON AB 3-0 SH27 (SUTURE) IMPLANT
SUT MONOCRYL 0 CTX 36 (SUTURE) ×3
SUT PDS AB 0 CTX 60 (SUTURE) IMPLANT
SUT PLAIN 2 0 XLH (SUTURE) IMPLANT
SUT VIC AB 0 CTX 36 (SUTURE) ×2
SUT VIC AB 0 CTX36XBRD ANBCTRL (SUTURE) IMPLANT
SUT VIC AB 2-0 CT1 27 (SUTURE)
SUT VIC AB 2-0 CT1 TAPERPNT 27 (SUTURE) IMPLANT
SUT VIC AB 4-0 KS 27 (SUTURE) ×1 IMPLANT
TOWEL OR 17X24 6PK STRL BLUE (TOWEL DISPOSABLE) ×2 IMPLANT
TRAY FOLEY CATH 14FR (SET/KITS/TRAYS/PACK) ×2 IMPLANT
WATER STERILE IRR 1000ML POUR (IV SOLUTION) ×2 IMPLANT

## 2013-12-07 NOTE — H&P (Addendum)
Emily Morse is a 28 y.o. female presenting for UC's. Maternal Medical History:  Reason for admission: Contractions.   Fetal activity: Perceived fetal activity is normal.   Last perceived fetal movement was within the past hour.    Prenatal complications: Thrombocytopenia.   Prenatal Complications - Diabetes: none.    OB History   Grav Para Term Preterm Abortions TAB SAB Ect Mult Living   0 Past Medical History  Diagnosis Date  . Herpes     last out break 2008  . Thrombocytopenia    Past Surgical History  Procedure Laterality Date  . Dilation and curettage of uterus    . Cesarean section  10/22/2011    Procedure: CESAREAN SECTION;  Surgeon: Brock Bad, MD;  Location: [facility];  Service: Gynecology;  Laterality: N/A;   Family History: family history is negative for Other, Alcohol abuse, Arthritis, Asthma, Birth defects, Cancer, COPD, Depression, Diabetes, Drug abuse, Early death, Hearing loss, Heart disease, Hyperlipidemia, Hypertension, Kidney disease, Learning disabilities, Mental illness, Mental retardation, Miscarriages / Stillbirths, Stroke, Vision loss, and Varicose Veins. Social History:  reports that she quit smoking about 7 months ago. She has never used smokeless tobacco. She reports that she does not drink alcohol or use illicit drugs.   Prenatal Transfer Tool  Maternal Diabetes: No Genetic Screening: Normal Maternal Ultrasounds/Referrals: Normal Fetal Ultrasounds or other Referrals:  Referred to Materal Fetal Medicine  Maternal Substance Abuse:  No Significant Maternal Medications:  Meds include: Other:  Significant Maternal Lab Results:  Lab values include: Other:  Other Comments:  Gestational Thrombocytopenia  Review of Systems  All other systems reviewed and are negative.   Dilation: 3.5 Effacement (%): 80 Station: -2 Exam by:: L. Paschal, RN Blood pressure 133/81, pulse 81, temperature 98.3 F (36.8 C), temperature source  Oral, resp. rate 20, height  (1.626 m), weight 146 lb (66.225 kg), last menstrual period 03/13/2013, unknown if currently breastfeeding. Maternal Exam:  Uterine Assessment: Contraction strength is moderate.  Abdomen: Patient reports no abdominal tenderness. Surgical scars: low transverse.   Fetal presentation: vertex  Cervix: Cervix evaluated by digital exam.     Physical Exam  Nursing note and vitals reviewed. Constitutional: She is oriented to person, place, and time. She appears well-developed and well-nourished.  HENT:  Head: Normocephalic and atraumatic.  Eyes: Conjunctivae are normal. Pupils are equal, round, and reactive to light.  Neck: Normal range of motion. Neck supple.  Cardiovascular: Normal rate and regular rhythm.   Respiratory: Effort normal.  GI: Soft.  Genitourinary: Vagina normal and uterus normal.  Musculoskeletal: Normal range of motion.  Neurological: She is alert and oriented to person, place, and time.  Skin: Skin is warm and dry.  Psychiatric: She has a normal mood and affect. Her behavior is normal. Judgment and thought content normal.    Prenatal labs: ABO, Rh: B/POS/-- (04/29 1101) Antibody: NEG (04/29 1101) Rubella: 3.62 (04/29 1101) RPR: NON REAC (05/28 1533)  HBsAg: NEGATIVE (04/29 1101)  HIV: NONREACTIVE (05/28 1533)  GBS: Detected (08/03 1444)   Assessment/Plan: 39.2 weeks.  Gestational Thrombocytopenia.  Early labor.  Previous C/S.  Desires repeat C/S.  Bora Bost A 12/07/2013, 9:05 AM

## 2013-12-07 NOTE — OR Nursing (Signed)
Per dr Jean Rosenthal, Pt back to room 169 until type and screen results with antibody, will bring pt back at that time.

## 2013-12-07 NOTE — Op Note (Signed)
Cesarean Section Procedure Note   Emily Morse   12/07/2013  Indications: Scheduled Proceedure/Maternal Request and Previous C/S in Labor.  Pre-operative Diagnosis: repeat cesarean section.  Gestational Thrombocytopenia   Post-operative Diagnosis: Same   Surgeon: Shanedra Lave A  Assistants:  Levie Heritage.  Anesthesia: general  Procedure Details:  The patient was seen in the Holding Room. The risks, benefits, complications, treatment options, and expected outcomes were discussed with the patient. The patient concurred with the proposed plan, giving informed consent. The patient was identified as Emily Morse and the procedure verified as C-Section Delivery. A Time Out was held and the above information confirmed.  After induction of anesthesia, the patient was draped and prepped in the usual sterile manner. A transverse incision was made and carried down through the subcutaneous tissue to the fascia. The fascial incision was made and extended transversely. The fascia was separated from the underlying rectus tissue superiorly and inferiorly. The peritoneum was identified and entered. The peritoneal incision was extended longitudinally. The utero-vesical peritoneal reflection was incised transversely and the bladder flap was bluntly freed from the lower uterine segment. A low transverse uterine incision was made. Delivered from cephalic presentation was a 3645 gram living newborn female infant(s). APGAR (1 MIN): 6   APGAR (5 MINS): 5   APGAR (10 MINS): 8    A cord ph was sent = 7.31. The umbilical cord was clamped and cut cord. A sample was obtained for evaluation. The placenta was removed Intact and appeared normal.  The uterine incision was closed with running locked sutures of 0 Monocryl. A second imbricating layer of the same suture was placed.  Hemostasis was observed. The paracolic gutters were irrigated. The parieto peritoneum was closed in a running fashion with 2-0 Vicryl.  The  fascia was then reapproximated with running sutures of 0 Vicryl.  The skin was closed with sutures.  Instrument, sponge, and needle counts were correct prior the abdominal closure and were correct at the conclusion of the case.    Findings: Particulate meconium.  Normal uterus, ovaries and tubes.   Estimated Blood Loss:   Total IV Fluids:   Urine Output: 100CC OF clear urine  Specimens:  Placenta to pathology  Complications: no complications  Disposition: PACU - hemodynamically stable.  Maternal Condition: stable   Baby condition / location:  Couplet care / Skin to Skin    Signed: Surgeon(s): Brock Bad, MD Levie Heritage, DO

## 2013-12-07 NOTE — Transfer of Care (Signed)
Anesthesia Post Note  Patient: Emily Morse  Procedure(s) Performed: Procedure(s) (LRB): CESAREAN SECTION (N/A)  Anesthesia type: General  Patient location: [facility] Unit  Post pain: Pain level controlled  Post assessment: Post-op Vital signs reviewed  Last Vitals:  Filed Vitals:   12/07/13 1130  BP: 150/89  Pulse: 79  Temp: 36.9 C  Resp: 18    Post vital signs: Reviewed  Level of consciousness: sedated  Complications: No apparent anesthesia complications

## 2013-12-07 NOTE — MAU Note (Signed)
regular contractions since 0500. Now every . No bleeding or leaking, has been passing mucous plug. Was 1cm yesterday.

## 2013-12-07 NOTE — Progress Notes (Signed)
Dr Clearance Coots explained risks and benefits of repeat c-section.  Pt verbalized understanding and signed consent.

## 2013-12-07 NOTE — Transfer of Care (Signed)
Immediate Anesthesia Transfer of Care Note  Patient: Emily Morse  Procedure(s) Performed: Procedure(s): CESAREAN SECTION (N/A)  Patient Location: PACU and Nursing Unit  Anesthesia Type:Spinal  Level of Consciousness: awake  Airway & Oxygen Therapy: Patient Spontanous Breathing  Post-op Assessment: Post -op Vital signs reviewed and stable  Post vital signs: stable  Complications: No apparent anesthesia complications

## 2013-12-07 NOTE — Lactation Note (Signed)
This note was copied from the chart of Emily Morse. Lactation Consultation Note  Patient Name: Emily Morse [id number] Date: 12/07/2013 Reason for consult: Other (Comment) (formula for exclusion)   Maternal Data Formula Feeding for Exclusion: Yes (baby in NICU) Reason for exclusion: Mother's choice to formula feed on admision  Feeding    LATCH Score/Interventions                      Lactation Tools Discussed/Used     Consult Status Consult Status: Complete    Alfred Levins 12/07/2013, 5:00 PM

## 2013-12-07 NOTE — Anesthesia Preprocedure Evaluation (Addendum)
Anesthesia Evaluation  Patient identified by MRN, date of birth, ID band Patient awake    Reviewed: Allergy & Precautions, H&P , Patient's Chart, lab work & pertinent test results, reviewed documented beta blocker date and time   History of Anesthesia Complications Negative for: history of anesthetic complications  Airway Mallampati: III TM Distance: >3 FB Neck ROM: full  Mouth opening: Limited Mouth Opening  Dental   Pulmonary former smoker,  breath sounds clear to auscultation        Cardiovascular Exercise Tolerance: Good Rhythm:regular Rate:Normal     Neuro/Psych    GI/Hepatic GERD-  ,  Endo/Other    Renal/GU      Musculoskeletal   Abdominal   Peds  Hematology   Anesthesia Other Findings   Reproductive/Obstetrics (+) Pregnancy                         Anesthesia Physical Anesthesia Plan  ASA: III and emergent  Anesthesia Plan: General ETT   Post-op Pain Management:    Induction:   Airway Management Planned:   Additional Equipment:   Intra-op Plan:   Post-operative Plan:   Informed Consent: I have reviewed the patients History and Physical, chart, labs and discussed the procedure including the risks, benefits and alternatives for the proposed anesthesia with the patient or authorized representative who has indicated his/her understanding and acceptance.   Dental Advisory Given  Plan Discussed with: CRNA and Surgeon  Anesthesia Plan Comments:        Anesthesia Quick Evaluation

## 2013-12-07 NOTE — Addendum Note (Signed)
Addendum created 12/07/13 1533 by Renford Dills, CRNA   Modules edited: Notes Section   Notes Section:  File: [id number]

## 2013-12-07 NOTE — Anesthesia Postprocedure Evaluation (Signed)
  Anesthesia Post Note  Patient: Emily Morse  Procedure(s) Performed: Procedure(s) (LRB): CESAREAN SECTION (N/A)  Anesthesia type: GA  Patient location: PACU  Post pain: Pain level controlled  Post assessment: Post-op Vital signs reviewed  Last Vitals:  Filed Vitals:   12/07/13 1300  BP: 137/80  Pulse: 70  Temp:   Resp: 13    Post vital signs: Reviewed  Level of consciousness: sedated  Complications: No apparent anesthesia complications

## 2013-12-07 NOTE — Anesthesia Postprocedure Evaluation (Signed)
  Anesthesia Post-op Note  Patient: Emily Morse  Procedure(s) Performed: Procedure(s): CESAREAN SECTION (N/A)  Patient Location: Mother/Baby  Anesthesia Type:General  Level of Consciousness: awake  Airway and Oxygen Therapy: Patient Spontanous Breathing  Post-op Pain: mild  Post-op Assessment: Patient's Cardiovascular Status Stable and Respiratory Function Stable  Post-op Vital Signs: stable  Last Vitals:  Filed Vitals:   12/07/13 1432  BP: 127/73  Pulse: 63  Temp: 36.9 C  Resp: 12    Complications: No apparent anesthesia complications

## 2013-12-08 ENCOUNTER — Encounter (HOSPITAL_COMMUNITY): Payer: Self-pay | Admitting: Obstetrics

## 2013-12-08 LAB — CBC
HCT: 27.9 % — ABNORMAL LOW (ref 36.0–46.0)
Hemoglobin: 9.5 g/dL — ABNORMAL LOW (ref 12.0–15.0)
MCH: 31.4 pg (ref 26.0–34.0)
MCHC: 34.1 g/dL (ref 30.0–36.0)
MCV: 92.1 fL (ref 78.0–100.0)
Platelets: 69 10*3/uL — ABNORMAL LOW (ref 150–400)
RBC: 3.03 MIL/uL — ABNORMAL LOW (ref 3.87–5.11)
RDW: 14.7 % (ref 11.5–15.5)
WBC: 8.9 10*3/uL (ref 4.0–10.5)

## 2013-12-08 NOTE — Progress Notes (Signed)
CSW attempted again to meet with MOB to complete assessment due to NICU admission, but MOB had more visitors at this time, and therefore it was not a good time to talk.  CSW will attempt again at a later time if MOB is available. 

## 2013-12-08 NOTE — Progress Notes (Signed)
Subjective: Postpartum Day 1: Cesarean Delivery Patient reports tolerating PO.    Objective: Vital signs in last 24 hours: Temp:  [97.2 F (36.2 C)-98.5 F (36.9 C)] 98.1 F (36.7 C) (09/02 0610) Pulse Rate:  [61-87] 61 (09/02 0610) Resp:  [12-27] 16 (09/02 0610) BP: (108-150)/(68-93) 112/68 mmHg (09/02 0610) SpO2:  [100 %] 100 % (09/02 0600) Weight:  [146 lb (66.225 kg)] 146 lb (66.225 kg) (09/01 0813)  Physical Exam:  General: alert and no distress Lochia: appropriate Uterine Fundus: firm Incision: healing well DVT Evaluation: No evidence of DVT seen on physical exam.   Recent Labs  12/07/13 0820 12/08/13 0610  HGB 12.7 9.5*  HCT 36.6 27.9*    Assessment/Plan: Status post Cesarean section. Doing well postoperatively.  Continue current care.  D/C PCA Dilaudid and start po Percocet.  Ambulate.  Sharne Linders A 12/08/2013, 7:33 AM

## 2013-12-08 NOTE — Progress Notes (Signed)
UR chart review completed.  

## 2013-12-08 NOTE — Progress Notes (Signed)
CSW attempted to meet with MOB in her first floor room/119 to introduce myself, complete assessment due to NICU admission and offer support, but she had multiple visitors at this time.  MOB seemed unsure of reason for CSW's visit.  CSW explained support services offered by NICU CSW and MOB replied that she has no questions, but that CSW can return later this afternoon.  CSW will attempt to meet with MOB at a later time. 

## 2013-12-09 DIAGNOSIS — O9081 Anemia of the puerperium: Secondary | ICD-10-CM | POA: Diagnosis not present

## 2013-12-09 NOTE — Progress Notes (Signed)
Patient ID: Emily Morse, female   DOB: 06-26-1985, 28 y.o.   MRN: [id number] Subjective: POD# 2 s/p Cesarean Delivery.  Indications: elective  RH status/Rubella reviewed. Feeding: bottle Patient reports tolerating PO.  Denies HA/SOB/C/P/N/V/dizziness.  Reports flatus. Breast symptoms: No..  She reports vaginal bleeding as normal, without clots.  She is ambulating, urinating without difficulty.     Objective: Vital signs in last 24 hours: BP 129/88  Pulse 72  Temp(Src) 98.8 F (37.1 C) (Oral)  Resp 20  Ht  (1.626 m)  Wt 66.225 kg (146 lb)  BMI 25.05 kg/m2  SpO2 97%  LMP 03/13/2013  Breastfeeding? Unknown       Physical Exam:  General: alert CV: Regular rate and rhythm Resp: clear Abdomen: soft, nontender, normal bowel sounds Lochia: minimal Uterine Fundus: firm, below umbilicus, nontender Incision: clean, dry and intact Ext: extremities normal, atraumatic, no cyanosis or edema and Homans sign is negative, no sign of DVT       Assessment/Plan: 28 y.o.  status post Cesarean section. POD# 2.   Doing well, stable.               Ambulate IS Routine post-op care  JACKSON-MOORE,Emmylou Bieker A 12/09/2013, 9:05 AM

## 2013-12-10 MED ORDER — OXYCODONE-ACETAMINOPHEN 5-325 MG PO TABS: 1.0000 | ORAL_TABLET | ORAL | Status: DC | PRN

## 2013-12-10 NOTE — Progress Notes (Signed)
Subjective: Postpartum Day 3: Cesarean Delivery Patient reports tolerating PO, + flatus, + BM and no problems voiding.    Objective: Vital signs in last 24 hours: Temp:  [97.9 F (36.6 C)-98.4 F (36.9 C)] 97.9 F (36.6 C) (09/04 0648) Pulse Rate:  [66-81] 66 (09/04 0648) Resp:  [17-20] 17 (09/04 0648) BP: (117-128)/(77-78) 117/77 mmHg (09/04 0648) SpO2:  [100 %] 100 % (09/04 4098)  Physical Exam:  General: alert and no distress Lochia: appropriate Uterine Fundus: firm Incision: healing well DVT Evaluation: No evidence of DVT seen on physical exam.   Recent Labs  12/08/13 0610  HGB 9.5*  HCT 27.9*    Assessment/Plan: Status post Cesarean section. Doing well postoperatively.  Discharge home with standard precautions and return to clinic in 2 weeks.  Dinna Severs A 12/10/2013, 11:35 AM

## 2013-12-10 NOTE — Discharge Summary (Signed)
Obstetric Discharge Summary Reason for Admission: cesarean section Prenatal Procedures: NST and ultrasound Intrapartum Procedures: cesarean: low cervical, transverse Postpartum Procedures: none Complications-Operative and Postpartum: none Hemoglobin  Date Value Ref Range Status  12/08/2013 9.5* 12.0 - 15.0 g/dL Final     REPEATED TO VERIFY     DELTA CHECK NOTED     HCT  Date Value Ref Range Status  12/08/2013 27.9* 36.0 - 46.0 % Final    Physical Exam:  General: alert and no distress Lochia: appropriate Uterine Fundus: firm Incision: healing well DVT Evaluation: No evidence of DVT seen on physical exam.  Discharge Diagnoses: Term Pregnancy-delivered  Discharge Information: Date: 12/10/2013 Activity: pelvic rest Diet: routine Medications: PNV, Ibuprofen, Colace, Iron and Percocet Condition: stable Instructions: refer to practice specific booklet Discharge to: home Follow-up Information   Follow up with Ladonne Sharples A, MD On 12/23/2013. (12/23/2013 @ 11:00 am)    Specialty:  Obstetrics and Gynecology   Contact information:   [address] [place] [address] [phone]       Follow up with Sayid Moll A, MD. Schedule an appointment as soon as possible for a visit in 2 weeks.   Specialty:  Obstetrics and Gynecology   Contact information:   [address] [place] [address] [phone]       Newborn Data: Live born female  Birth Weight: 8 lb 0.6 oz (3646 g) APGAR: 6, 5  Home with mother.  Branch Pacitti A 12/10/2013, 11:37 AM

## 2013-12-11 LAB — TYPE AND SCREEN
ABO/RH(D): B POS
Antibody Screen: NEGATIVE
Unit division: 0
Unit division: 0
Unit division: 0
Unit division: 0

## 2013-12-14 ENCOUNTER — Telehealth: Payer: Self-pay | Admitting: *Deleted

## 2013-12-14 DIAGNOSIS — N390 Urinary tract infection, site not specified: Secondary | ICD-10-CM

## 2013-12-14 MED ORDER — SULFAMETHOXAZOLE-TMP DS 800-160 MG PO TABS: 1.0000 | ORAL_TABLET | Freq: Two times a day (BID) | ORAL | Status: DC

## 2013-12-14 NOTE — Telephone Encounter (Signed)
Patient states she was not instructed about when to remove her dressing. Patient also thinks she has a UTI. 12:25 After speaking with patient- she can remove the honeycomb dressing- check of her op note states she has sutures. Patient told to shower and clean area, pat dry and let air get to her incision. Patient also treated for UTI- Rx called to her pharmacy.

## 2013-12-16 ENCOUNTER — Other Ambulatory Visit: Payer: Self-pay | Admitting: Obstetrics

## 2013-12-16 DIAGNOSIS — O9089 Other complications of the puerperium, not elsewhere classified: Secondary | ICD-10-CM

## 2013-12-16 DIAGNOSIS — G8918 Other acute postprocedural pain: Secondary | ICD-10-CM

## 2013-12-16 MED ORDER — HYDROMORPHONE HCL 4 MG PO TABS: 4.0000 mg | ORAL_TABLET | Freq: Four times a day (QID) | ORAL | Status: DC | PRN

## 2013-12-16 MED ORDER — OXYCODONE-ACETAMINOPHEN 5-325 MG PO TABS: 1.0000 | ORAL_TABLET | ORAL | Status: DC | PRN

## 2013-12-17 ENCOUNTER — Other Ambulatory Visit: Payer: Self-pay | Admitting: Obstetrics

## 2013-12-17 DIAGNOSIS — G8918 Other acute postprocedural pain: Secondary | ICD-10-CM

## 2013-12-17 MED ORDER — OXYCODONE-ACETAMINOPHEN 10-325 MG PO TABS: 1.0000 | ORAL_TABLET | ORAL | Status: DC | PRN

## 2013-12-23 ENCOUNTER — Ambulatory Visit: Payer: Medicaid Other | Admitting: Obstetrics

## 2013-12-29 ENCOUNTER — Ambulatory Visit (INDEPENDENT_AMBULATORY_CARE_PROVIDER_SITE_OTHER): Payer: Medicaid Other | Admitting: Obstetrics

## 2013-12-29 ENCOUNTER — Encounter: Payer: Self-pay | Admitting: Obstetrics

## 2013-12-29 DIAGNOSIS — Z3009 Encounter for other general counseling and advice on contraception: Secondary | ICD-10-CM

## 2013-12-30 ENCOUNTER — Encounter: Payer: Self-pay | Admitting: Obstetrics

## 2013-12-30 NOTE — Progress Notes (Signed)
Subjective:     Emily Morse is a 28 y.o. female who presents for a postpartum visit. She is 3 weeks postpartum following a low cervical transverse Cesarean section. I have fully reviewed the prenatal and intrapartum course. The delivery was at 39 gestational weeks. Outcome: repeat cesarean section, low transverse incision. Anesthesia: general. Postpartum course has been normal. Baby's course has been normal. Baby is feeding by bottle Rush Barer. Bleeding thin lochia. Bowel function is normal. Bladder function is normal. Patient is not sexually active. Contraception method is abstinence. Postpartum depression screening: negative.  Tobacco, alcohol and substance abuse history reviewed.  Adult immunizations reviewed including TDAP, rubella and varicella.  The following portions of the patient's history were reviewed and updated as appropriate: allergies, current medications, past family history, past medical history, past social history, past surgical history and problem list.  Review of Systems A comprehensive review of systems was negative.   Objective:    BP 126/88  Pulse 74  Temp(Src) 98 F (36.7 C)  Ht  (1.626 m)  Wt 129 lb (58.514 kg)  BMI 22.13 kg/m2  Breastfeeding? No   PE:       Abdomen:  Soft, NT.  Incision C, D, I.    Assessment:    3 weeks postpartum.  Doing well.  Counseling for contraception. Plan:    1. Contraception: Options discussed. 2. Continue PNV's. 3. Follow up in: 6 weeks or as needed.   Healthy lifestyle practices reviewed

## 2014-01-17 ENCOUNTER — Telehealth: Payer: Self-pay | Admitting: *Deleted

## 2014-01-17 DIAGNOSIS — B3731 Acute candidiasis of vulva and vagina: Secondary | ICD-10-CM

## 2014-01-17 DIAGNOSIS — B373 Candidiasis of vulva and vagina: Secondary | ICD-10-CM

## 2014-01-17 MED ORDER — FLUCONAZOLE 150 MG PO TABS: 150.0000 mg | ORAL_TABLET | Freq: Once | ORAL | Status: DC

## 2014-01-17 NOTE — Telephone Encounter (Signed)
Patient has a yeast infection and is requesting treatment- OK to call Diflucan per Dr Clearance CootsHarper. Patient notified.

## 2014-01-21 ENCOUNTER — Other Ambulatory Visit: Payer: Self-pay

## 2014-02-07 ENCOUNTER — Encounter: Payer: Self-pay | Admitting: Obstetrics

## 2014-02-09 ENCOUNTER — Ambulatory Visit: Payer: Medicaid Other | Admitting: Obstetrics & Gynecology

## 2014-02-16 ENCOUNTER — Ambulatory Visit: Payer: Medicaid Other | Admitting: Obstetrics & Gynecology

## 2014-02-18 ENCOUNTER — Ambulatory Visit (INDEPENDENT_AMBULATORY_CARE_PROVIDER_SITE_OTHER): Payer: Medicaid Other | Admitting: Obstetrics

## 2014-02-18 ENCOUNTER — Other Ambulatory Visit: Payer: Self-pay | Admitting: Obstetrics

## 2014-02-18 ENCOUNTER — Encounter: Payer: Self-pay | Admitting: Obstetrics

## 2014-02-18 DIAGNOSIS — R52 Pain, unspecified: Secondary | ICD-10-CM | POA: Insufficient documentation

## 2014-02-18 DIAGNOSIS — Z30019 Encounter for initial prescription of contraceptives, unspecified: Secondary | ICD-10-CM

## 2014-02-18 DIAGNOSIS — D696 Thrombocytopenia, unspecified: Secondary | ICD-10-CM

## 2014-02-18 MED ORDER — CITRANATAL 90 DHA 90-1 & 300 MG PO MISC: 1.0000 | Freq: Every day | ORAL | Status: DC

## 2014-02-18 MED ORDER — OXYCODONE HCL 10 MG PO TABS: 10.0000 mg | ORAL_TABLET | Freq: Four times a day (QID) | ORAL | Status: DC | PRN

## 2014-02-19 ENCOUNTER — Encounter: Payer: Self-pay | Admitting: Obstetrics

## 2014-02-19 LAB — CBC
HCT: 39.4 % (ref 36.0–46.0)
Hemoglobin: 12.8 g/dL (ref 12.0–15.0)
MCH: 28.8 pg (ref 26.0–34.0)
MCHC: 32.5 g/dL (ref 30.0–36.0)
MCV: 88.7 fL (ref 78.0–100.0)
Platelets: 106 10*3/uL — ABNORMAL LOW (ref 150–400)
RBC: 4.44 MIL/uL (ref 3.87–5.11)
RDW: 13.7 % (ref 11.5–15.5)
WBC: 5.8 10*3/uL (ref 4.0–10.5)

## 2014-02-19 LAB — WET PREP BY MOLECULAR PROBE
Candida species: NEGATIVE
Gardnerella vaginalis: NEGATIVE
Trichomonas vaginosis: NEGATIVE

## 2014-02-19 LAB — GC/CHLAMYDIA PROBE AMP
CT Probe RNA: NEGATIVE
GC Probe RNA: NEGATIVE

## 2014-02-19 NOTE — Progress Notes (Signed)
Cancelled.  

## 2014-02-19 NOTE — Progress Notes (Signed)
Subjective:     Emily Morse is a 28 y.o. female who presents for a postpartum visit. She is 6 weeks postpartum following a low cervical transverse Cesarean section. I have fully reviewed the prenatal and intrapartum course. The delivery was at 39 gestational weeks. Outcome: repeat cesarean section, low transverse incision. Anesthesia: general. Postpartum course has been normal. Baby's course has been normal. Baby is feeding by bottle - Similac Advance. Bleeding thin lochia. Bowel function is normal. Bladder function is normal. Patient is not sexually active. Contraception method is abstinence. Postpartum depression screening: negative.  Tobacco, alcohol and substance abuse history reviewed.  Adult immunizations reviewed including TDAP, rubella and varicella.  The following portions of the patient's history were reviewed and updated as appropriate: allergies, current medications, past family history, past medical history, past social history, past surgical history and problem list.  Review of Systems A comprehensive review of systems was negative.   Objective:    BP 122/85 mmHg  Pulse 64  Temp(Src) 97.2 F (36.2 C)  Ht 5\' 4"  (1.626 m)  Wt 127 lb (57.607 kg)  BMI 21.79 kg/m2  LMP 02/09/2014  Breastfeeding? No  General:  alert and no distress   Breasts:  inspection negative, no nipple discharge or bleeding, no masses or nodularity palpable  Lungs: clear to auscultation bilaterally  Heart:  regular rate and rhythm, S1, S2 normal, no murmur, click, rub or gallop  Abdomen: normal findings: soft, non-tender   Vulva:  normal  Vagina: normal vagina  Cervix:  no cervical motion tenderness  Corpus: normal size, contour, position, consistency, mobility, non-tender  Adnexa:  no mass, fullness, tenderness  Rectal Exam: Not performed.           Assessment:     Normal postpartum exam. Pap smear not done at today's visit.  Plan:    1. Contraception: abstinence 2. Considering tubal 3.  Follow up in: 6 weeks or as needed.   Healthy lifestyle practices reviewed

## 2014-03-23 ENCOUNTER — Institutional Professional Consult (permissible substitution): Payer: Medicaid Other | Admitting: Obstetrics & Gynecology

## 2014-04-04 ENCOUNTER — Encounter: Payer: Self-pay | Admitting: *Deleted

## 2014-04-05 ENCOUNTER — Encounter: Payer: Self-pay | Admitting: Obstetrics & Gynecology

## 2014-04-26 ENCOUNTER — Other Ambulatory Visit: Payer: Self-pay | Admitting: Obstetrics

## 2014-04-26 DIAGNOSIS — D696 Thrombocytopenia, unspecified: Secondary | ICD-10-CM

## 2014-04-27 ENCOUNTER — Other Ambulatory Visit: Payer: Self-pay | Admitting: Obstetrics

## 2014-04-27 ENCOUNTER — Other Ambulatory Visit: Payer: Medicaid Other

## 2014-04-27 DIAGNOSIS — G4700 Insomnia, unspecified: Secondary | ICD-10-CM

## 2014-04-27 DIAGNOSIS — D696 Thrombocytopenia, unspecified: Secondary | ICD-10-CM

## 2014-04-27 MED ORDER — ZOLPIDEM TARTRATE 10 MG PO TABS: 5.0000 mg | ORAL_TABLET | Freq: Every evening | ORAL | Status: DC | PRN

## 2014-04-27 MED ORDER — ZOLPIDEM TARTRATE 10 MG PO TABS: 10.0000 mg | ORAL_TABLET | Freq: Every evening | ORAL | Status: AC | PRN

## 2014-04-28 ENCOUNTER — Telehealth: Payer: Self-pay | Admitting: *Deleted

## 2014-04-28 LAB — CBC
HCT: 41.2 % (ref 36.0–46.0)
Hemoglobin: 13.2 g/dL (ref 12.0–15.0)
MCH: 27.8 pg (ref 26.0–34.0)
MCHC: 32 g/dL (ref 30.0–36.0)
MCV: 86.7 fL (ref 78.0–100.0)
MPV: 12.2 fL (ref 8.6–12.4)
Platelets: 129 10*3/uL — ABNORMAL LOW (ref 150–400)
RBC: 4.75 MIL/uL (ref 3.87–5.11)
RDW: 14 % (ref 11.5–15.5)
WBC: 7.3 10*3/uL (ref 4.0–10.5)

## 2014-04-28 NOTE — Telephone Encounter (Signed)
Pt called to office for lab results.  Pt made aware that we need to review with Dr Clearance CootsHarper before discussing results.   Review with Dr Clearance CootsHarper, he states that the pt should be fine to have procedure done giving that her platelets are over 100,000.  Return call to pt.  Pt made aware of lab result and that she may continue with procedure as planned.  Pt made aware that if she needs copy of results she can come by office or call.  Pt states understanding and has no other questions.

## 2014-05-10 ENCOUNTER — Telehealth: Payer: Self-pay | Admitting: *Deleted

## 2014-05-10 NOTE — Telephone Encounter (Signed)
Patient had her playlet counts done 2 weeks ago pre termination, but she didn't have the money. She wants to have it repeated now that she has the funds. Labs reviewed by Dr Clearance CootsHarper- should be OK with out repeat, but if they require repeat- they may do or we can do. Patient notified.

## 2014-05-26 ENCOUNTER — Other Ambulatory Visit: Payer: Medicaid Other

## 2014-05-26 DIAGNOSIS — Z862 Personal history of diseases of the blood and blood-forming organs and certain disorders involving the immune mechanism: Secondary | ICD-10-CM

## 2014-05-27 LAB — CBC
HCT: 38.4 % (ref 36.0–46.0)
Hemoglobin: 12.5 g/dL (ref 12.0–15.0)
MCH: 27.9 pg (ref 26.0–34.0)
MCHC: 32.6 g/dL (ref 30.0–36.0)
MCV: 85.7 fL (ref 78.0–100.0)
MPV: 12 fL (ref 8.6–12.4)
Platelets: 99 10*3/uL — ABNORMAL LOW (ref 150–400)
RBC: 4.48 MIL/uL (ref 3.87–5.11)
RDW: 14.8 % (ref 11.5–15.5)
WBC: 5.6 10*3/uL (ref 4.0–10.5)

## 2014-06-16 ENCOUNTER — Other Ambulatory Visit: Payer: Self-pay | Admitting: *Deleted

## 2014-06-16 MED ORDER — OB COMPLETE PETITE 35-5-1-200 MG PO CAPS: 1.0000 | ORAL_CAPSULE | Freq: Every day | ORAL | Status: DC

## 2014-06-16 NOTE — Progress Notes (Signed)
06/16/2014 10:14AM.  Patient called the office requesting a PNV Rx. Patient notified that we would send an Rx to the pharmacy. Patient voiced understanding.

## 2014-07-06 ENCOUNTER — Telehealth: Payer: Self-pay | Admitting: *Deleted

## 2014-07-06 NOTE — Telephone Encounter (Signed)
Patient contacted the office to schedule a NOB appointment. Patient has had a positive home pregnancy test. Patient has not verified her pregnancy anywhere at this time. Patient advised we must verify pregnancy before scheduling a NOB appointment. Patient given an appointment for a pregnancy test on 07-08-14 @ 11:30 am. Patient advised upon positive test in the office we will schedule NOB appointment. Patient verbalized understanding.

## 2014-07-08 ENCOUNTER — Ambulatory Visit (INDEPENDENT_AMBULATORY_CARE_PROVIDER_SITE_OTHER): Payer: Medicaid Other | Admitting: Obstetrics

## 2014-07-08 VITALS — BP 111/63 | HR 70 | Temp 98.6°F | Ht 64.0 in | Wt 124.0 lb

## 2014-07-08 DIAGNOSIS — Z3200 Encounter for pregnancy test, result unknown: Secondary | ICD-10-CM

## 2014-07-08 DIAGNOSIS — Z3201 Encounter for pregnancy test, result positive: Secondary | ICD-10-CM

## 2014-07-08 LAB — POCT URINE PREGNANCY: Preg Test, Ur: POSITIVE

## 2014-07-08 NOTE — Progress Notes (Signed)
Patient in the office today for a confirmation of pregnancy. Patient states she had a home pregnancy test that was positive. Patient states she intends on continuing the pregnancy. Patient advised to continue prenatal vitamins. Patient encouraged to schedule a NOB appointment. Patient advised of no show policy.   BP 111/63 mmHg  Pulse 70  Temp(Src) 98.6 F (37 C)  Ht 5\' 4"  (1.626 m)  Wt 124 lb (56.246 kg)  BMI 21.27 kg/m2  LMP 03/12/2014

## 2014-07-10 ENCOUNTER — Other Ambulatory Visit: Payer: Self-pay | Admitting: Obstetrics

## 2014-07-27 ENCOUNTER — Ambulatory Visit (INDEPENDENT_AMBULATORY_CARE_PROVIDER_SITE_OTHER): Payer: Medicaid Other | Admitting: Obstetrics

## 2014-07-27 ENCOUNTER — Encounter: Payer: Self-pay | Admitting: Obstetrics

## 2014-07-27 VITALS — BP 116/72 | HR 70 | Temp 98.8°F | Wt 126.0 lb

## 2014-07-27 DIAGNOSIS — Z1389 Encounter for screening for other disorder: Secondary | ICD-10-CM

## 2014-07-27 DIAGNOSIS — Z3482 Encounter for supervision of other normal pregnancy, second trimester: Secondary | ICD-10-CM

## 2014-07-27 DIAGNOSIS — O2690 Pregnancy related conditions, unspecified, unspecified trimester: Secondary | ICD-10-CM | POA: Diagnosis not present

## 2014-07-27 DIAGNOSIS — Z331 Pregnant state, incidental: Secondary | ICD-10-CM

## 2014-07-27 DIAGNOSIS — Z862 Personal history of diseases of the blood and blood-forming organs and certain disorders involving the immune mechanism: Secondary | ICD-10-CM

## 2014-07-27 LAB — POCT URINALYSIS DIPSTICK
Bilirubin, UA: NEGATIVE
Blood, UA: NEGATIVE
Glucose, UA: NEGATIVE
Ketones, UA: NEGATIVE
Leukocytes, UA: NEGATIVE
Nitrite, UA: NEGATIVE
Protein, UA: NEGATIVE
Spec Grav, UA: 1.01
Urobilinogen, UA: 1
pH, UA: 7

## 2014-07-27 NOTE — Addendum Note (Signed)
Addended by: Marya LandryFOSTER, Renessa Wellnitz D on: 07/27/2014 04:48 PM   Modules accepted: Orders

## 2014-07-27 NOTE — Progress Notes (Signed)
Subjective:    Emily Morse is being seen today for her first obstetrical visit.  This is not a planned pregnancy. She is at 5759w4d gestation. Her obstetrical history is significant for Gestational Thombocytopenia. Relationship with FOB: significant other, not living together. Patient does intend to breast feed. Pregnancy history fully reviewed.  The information documented in the HPI was reviewed and verified.  Menstrual History: OB History    Gravida Para Term Preterm AB TAB SAB Ectopic Multiple Living   5 2 2  0 2  2   2       Patient's last menstrual period was 03/12/2014.    Past Medical History  Diagnosis Date  . Herpes     last out break 2008  . Thrombocytopenia     Past Surgical History  Procedure Laterality Date  . Dilation and curettage of uterus    . Cesarean section  10/22/2011    Procedure: CESAREAN SECTION;  Surgeon: Brock Badharles A Chalice Philbert, MD;  Location: [facility];  Service: Gynecology;  Laterality: N/A;  . Cesarean section N/A 12/07/2013    Procedure: CESAREAN SECTION;  Surgeon: Brock Badharles A Norleen Xie, MD;  Location: [facility];  Service: Obstetrics;  Laterality: N/A;     (Not in a hospital admission) No Known Allergies  History  Substance Use Topics  . Smoking status: Former Smoker    Quit date: 05/06/2013  . Smokeless tobacco: Never Used  . Alcohol Use: No    Family History  Problem Relation Age of Onset  . Other Neg Hx   . Alcohol abuse Neg Hx   . Arthritis Neg Hx   . Asthma Neg Hx   . Birth defects Neg Hx   . Cancer Neg Hx   . COPD Neg Hx   . Depression Neg Hx   . Diabetes Neg Hx   . Drug abuse Neg Hx   . Early death Neg Hx   . Hearing loss Neg Hx   . Heart disease Neg Hx   . Hyperlipidemia Neg Hx   . Hypertension Neg Hx   . Kidney disease Neg Hx   . Learning disabilities Neg Hx   . Mental illness Neg Hx   . Mental retardation Neg Hx   . Miscarriages / Stillbirths Neg Hx   . Stroke Neg Hx   . Vision loss Neg Hx   . Varicose Veins Neg Hx      Review of  Systems Constitutional: negative for weight loss Gastrointestinal: negative for vomiting Genitourinary:negative for genital lesions and vaginal discharge and dysuria Musculoskeletal:negative for back pain Behavioral/Psych: negative for abusive relationship, depression, illegal drug usage and tobacco use    Objective:    BP 116/72 mmHg  Pulse 70  Temp(Src) 98.8 F (37.1 C)  Wt 126 lb (57.153 kg)  LMP 03/12/2014 General Appearance:    Alert, cooperative, no distress, appears stated age  Head:    Normocephalic, without obvious abnormality, atraumatic  Eyes:    PERRL, conjunctiva/corneas clear, EOM's intact, fundi    benign, both eyes  Ears:    Normal TM's and external ear canals, both ears  Nose:   Nares normal, septum midline, mucosa normal, no drainage    or sinus tenderness  Throat:   Lips, mucosa, and tongue normal; teeth and gums normal  Neck:   Supple, symmetrical, trachea midline, no adenopathy;    thyroid:  no enlargement/tenderness/nodules; no carotid   bruit or JVD  Back:     Symmetric, no curvature, ROM normal, no CVA tenderness  Lungs:     Clear to auscultation bilaterally, respirations unlabored  Chest Wall:    No tenderness or deformity   Heart:    Regular rate and rhythm, S1 and S2 normal, no murmur, rub   or gallop  Breast Exam:    No tenderness, masses, or nipple abnormality  Abdomen:     Soft, non-tender, bowel sounds active all four quadrants,    no masses, no organomegaly  Genitalia:    Normal female without lesion, discharge or tenderness  Extremities:   Extremities normal, atraumatic, no cyanosis or edema  Pulses:   2+ and symmetric all extremities  Skin:   Skin color, texture, turgor normal, no rashes or lesions  Lymph nodes:   Cervical, supraclavicular, and axillary nodes normal  Neurologic:   CNII-XII intact, normal strength, sensation and reflexes    throughout      Lab Review Urine pregnancy test Labs reviewed yes Radiologic studies reviewed  yes Assessment:    Pregnancy at [redacted]w[redacted]d weeks    Plan:      Prenatal vitamins.  Counseling provided regarding continued use of seat belts, cessation of alcohol consumption, smoking or use of illicit drugs; infection precautions i.e., influenza/TDAP immunizations, toxoplasmosis,CMV, parvovirus, listeria and varicella; workplace safety, exercise during pregnancy; routine dental care, safe medications, sexual activity, hot tubs, saunas, pools, travel, caffeine use, fish and methlymercury, potential toxins, hair treatments, varicose veins Weight gain recommendations per IOM guidelines reviewed: underweight/BMI< 18.5--> gain 28 - 40 lbs; normal weight/BMI 18.5 - 24.9--> gain 25 - 35 lbs; overweight/BMI 25 - 29.9--> gain 15 - 25 lbs; obese/BMI >30->gain  11 - 20 lbs Problem list reviewed and updated. FIRST/CF mutation testing/NIPT/QUAD SCREEN/fragile X/Ashkenazi Jewish population testing/Spinal muscular atrophy discussed: requested. Role of ultrasound in pregnancy discussed; fetal survey: requested. Amniocentesis discussed: not indicated. VBAC calculator score: VBAC consent form provided No orders of the defined types were placed in this encounter.   Orders Placed This Encounter  Procedures  . Culture, OB Urine  . US OB Comp + 14 Wk    Standing Status: Future     Number of Occurrences:      Standing Expiration Date: 09/26/2015    Order Specific Question:  Reason for Exam (SYMPTOM  OR DIAGNOSIS REQUIRED)    Answer:  anatomy.  h/o gestational thrombocytopenia.    Order Specific Question:  Preferred imaging location?    Answer:  [facility]  . Obstetric panel  . HIV antibody  . Hemoglobinopathy evaluation  . Varicella zoster antibody, IgG  . Vit D  25 hydroxy (rtn osteoporosis monitoring)  . AMB Referral to Maternal Fetal Medicine (MFM)    Referral Priority:  Routine    Referral Type:  Consultation    Number of Visits Requested:  1  . POCT urinalysis dipstick    Follow up in 4  weeks.

## 2014-07-28 LAB — OBSTETRIC PANEL
Antibody Screen: NEGATIVE
Basophils Absolute: 0 10*3/uL (ref 0.0–0.1)
Basophils Relative: 0 % (ref 0–1)
Eosinophils Absolute: 0.1 10*3/uL (ref 0.0–0.7)
Eosinophils Relative: 1 % (ref 0–5)
HCT: 35.8 % — ABNORMAL LOW (ref 36.0–46.0)
Hemoglobin: 11.8 g/dL — ABNORMAL LOW (ref 12.0–15.0)
Hepatitis B Surface Ag: NEGATIVE
Lymphocytes Relative: 19 % (ref 12–46)
Lymphs Abs: 1.2 10*3/uL (ref 0.7–4.0)
MCH: 28.6 pg (ref 26.0–34.0)
MCHC: 33 g/dL (ref 30.0–36.0)
MCV: 86.9 fL (ref 78.0–100.0)
MPV: 12.3 fL (ref 8.6–12.4)
Monocytes Absolute: 0.3 10*3/uL (ref 0.1–1.0)
Monocytes Relative: 5 % (ref 3–12)
Neutro Abs: 4.7 10*3/uL (ref 1.7–7.7)
Neutrophils Relative %: 75 % (ref 43–77)
Platelets: 89 10*3/uL — ABNORMAL LOW (ref 150–400)
RBC: 4.12 MIL/uL (ref 3.87–5.11)
RDW: 15.2 % (ref 11.5–15.5)
Rh Type: POSITIVE
Rubella: 3.61 Index — ABNORMAL HIGH (ref <0.90)
WBC: 6.3 10*3/uL (ref 4.0–10.5)

## 2014-07-28 LAB — HIV ANTIBODY (ROUTINE TESTING W REFLEX): HIV 1&2 Ab, 4th Generation: NONREACTIVE

## 2014-07-28 LAB — VARICELLA ZOSTER ANTIBODY, IGG: Varicella IgG: 1852 Index — ABNORMAL HIGH (ref <135.00)

## 2014-07-28 LAB — VITAMIN D 25 HYDROXY (VIT D DEFICIENCY, FRACTURES): Vit D, 25-Hydroxy: 26 ng/mL — ABNORMAL LOW (ref 30–100)

## 2014-07-29 LAB — CULTURE, OB URINE
Colony Count: NO GROWTH
Organism ID, Bacteria: NO GROWTH

## 2014-07-29 LAB — HEMOGLOBINOPATHY EVALUATION
Hemoglobin Other: 0 %
Hgb A2 Quant: 2.3 % (ref 2.2–3.2)
Hgb A: 97.7 % (ref 96.8–97.8)
Hgb F Quant: 0 % (ref 0.0–2.0)
Hgb S Quant: 0 %

## 2014-07-31 LAB — SURESWAB, VAGINOSIS/VAGINITIS PLUS
Atopobium vaginae: NOT DETECTED Log (cells/mL)
C. albicans, DNA: NOT DETECTED
C. glabrata, DNA: NOT DETECTED
C. parapsilosis, DNA: NOT DETECTED
C. trachomatis RNA, TMA: NOT DETECTED
C. tropicalis, DNA: NOT DETECTED
Gardnerella vaginalis: NOT DETECTED Log (cells/mL)
LACTOBACILLUS SPECIES: >8 Log (cells/mL)
MEGASPHAERA SPECIES: NOT DETECTED Log (cells/mL)
N. gonorrhoeae RNA, TMA: NOT DETECTED
T. vaginalis RNA, QL TMA: NOT DETECTED

## 2014-08-01 ENCOUNTER — Telehealth: Payer: Self-pay

## 2014-08-01 NOTE — Telephone Encounter (Signed)
Called patient with MFM appt date and time 08/09/14 at 1:00

## 2014-08-03 LAB — AFP, QUAD SCREEN
AFP: 60.2 ng/mL
Age Alone: 1:962 {titer}
Curr Gest Age: 19.4 wks.days
Down Syndrome Scr Risk Est: 1:833 {titer}
HCG, Total: 35.96 IU/mL
INH: 353 pg/mL
Interpretation-AFP: NEGATIVE
MoM for AFP: 0.91
MoM for INH: 1.89
MoM for hCG: 1.33
Open Spina bifida: NEGATIVE
Osb Risk: 1:37000 {titer}
Tri 18 Scr Risk Est: NEGATIVE
Trisomy 18 (Edward) Syndrome Interp.: 1:58400 {titer}
uE3 Mom: 0.84
uE3 Value: 1.49 ng/mL

## 2014-08-09 ENCOUNTER — Encounter (HOSPITAL_COMMUNITY): Payer: Self-pay

## 2014-08-09 ENCOUNTER — Ambulatory Visit (HOSPITAL_COMMUNITY)
Admission: RE | Admit: 2014-08-09 | Discharge: 2014-08-09 | Disposition: A | Discharge status: Home / Self Care | Payer: Medicaid Other | Source: Ambulatory Visit | Attending: Obstetrics | Admitting: Obstetrics

## 2014-08-09 ENCOUNTER — Other Ambulatory Visit: Payer: Self-pay | Admitting: Obstetrics

## 2014-08-09 DIAGNOSIS — Z3689 Encounter for other specified antenatal screening: Secondary | ICD-10-CM | POA: Insufficient documentation

## 2014-08-09 DIAGNOSIS — D6959 Other secondary thrombocytopenia: Secondary | ICD-10-CM | POA: Diagnosis not present

## 2014-08-09 DIAGNOSIS — O99112 Other diseases of the blood and blood-forming organs and certain disorders involving the immune mechanism complicating pregnancy, second trimester: Secondary | ICD-10-CM | POA: Diagnosis not present

## 2014-08-09 DIAGNOSIS — Z3A21 21 weeks gestation of pregnancy: Secondary | ICD-10-CM | POA: Insufficient documentation

## 2014-08-09 DIAGNOSIS — Z87891 Personal history of nicotine dependence: Secondary | ICD-10-CM | POA: Insufficient documentation

## 2014-08-09 DIAGNOSIS — Z862 Personal history of diseases of the blood and blood-forming organs and certain disorders involving the immune mechanism: Secondary | ICD-10-CM

## 2014-08-09 DIAGNOSIS — O352XX0 Maternal care for (suspected) hereditary disease in fetus, not applicable or unspecified: Secondary | ICD-10-CM | POA: Insufficient documentation

## 2014-08-09 NOTE — Progress Notes (Signed)
MATERNAL FETAL MEDICINE CONSULT  Patient Name: Emily Morse Medical Record Number:  [id number] Date of Birth: [birth year] Requesting Physician Name:  Brock Bad, MD Date of Service: 08/09/2014  Chief Complaint Gestational thrombocytopenia  History of Present Illness Emily Morse was seen today secondary to gestational thrombocytopenia at the request of Brock Bad, MD.  The patient is a 29 y.o. [id number] [redacted]w[redacted]d with an EDD of 12/17/2014, Date entered prior to episode creation dating method.  She had thrombocytopenia in her previous two pregnancies.  Her platelet count was high enough in her first pregnancy that she was able to have an epidural.  However, in her second pregnancy her platelet count was low enough to require general anesthesia for her LTCS.  She does not have thrombocytopenia outside of pregnancy.  She reports no easy bruising, nose bleeding, or bleeding gums.  Review of Systems Pertinent items are noted in HPI.  Patient History OB History  Gravida Para Term Preterm AB SAB TAB Ectopic Multiple Living  0 # Outcome Date GA Lbr Len/2nd Weight Sex Delivery Anes PTL Lv  5 Current           4 Term 12/07/13 [redacted]w[redacted]d  8 lb 0.6 oz (3.646 kg) Imagene Riches  Y  3 Term 10/22/11 [redacted]w[redacted]d  7 lb 9.9 oz (3.455 kg) F CS-LTranv EPI  Y  2 SAB 2010 [redacted]w[redacted]d            Comments: neural tube defect iufd  1 SAB 2008 [redacted]w[redacted]d             Past Medical History  Diagnosis Date  . Herpes     last out break 2008  . Thrombocytopenia     Past Surgical History  Procedure Laterality Date  . Dilation and curettage of uterus    . Cesarean section  10/22/2011    Procedure: CESAREAN SECTION;  Surgeon: Brock Bad, MD;  Location: [facility];  Service: Gynecology;  Laterality: N/A;  . Cesarean section N/A 12/07/2013    Procedure: CESAREAN SECTION;  Surgeon: Brock Bad, MD;  Location: [facility];  Service: Obstetrics;  Laterality: N/A;    History   Social History  .  Marital Status: Single    Spouse Name: N/A  . Number of Children: N/A  . Years of Education: N/A   Social History Main Topics  . Smoking status: Former Smoker    Quit date: 05/06/2013  . Smokeless tobacco: Never Used  . Alcohol Use: No  . Drug Use: No  . Sexual Activity:    Partners: Male    Birth Control/ Protection: None   Other Topics Concern  . Not on file   Social History Narrative    Family History  Problem Relation Age of Onset  . Other Neg Hx   . Alcohol abuse Neg Hx   . Arthritis Neg Hx   . Asthma Neg Hx   . Birth defects Neg Hx   . Cancer Neg Hx   . COPD Neg Hx   . Depression Neg Hx   . Diabetes Neg Hx   . Drug abuse Neg Hx   . Early death Neg Hx   . Hearing loss Neg Hx   . Heart disease Neg Hx   . Hyperlipidemia Neg Hx   . Hypertension Neg Hx   . Kidney disease Neg Hx   . Learning disabilities Neg Hx   .  Mental illness Neg Hx   . Mental retardation Neg Hx   . Miscarriages / Stillbirths Neg Hx   . Stroke Neg Hx   . Vision loss Neg Hx   . Varicose Veins Neg Hx    In addition, the patient has no family history of mental retardation, birth defects, or genetic diseases.  Physical Examination There were no vitals filed for this visit. General appearance - alert, well appearing, and in no distress Abdomen - soft, nontender, nondistended, no masses or organomegaly Extremities - no pedal edema noted  Assessment and Recommendations 1.  Gestational thrombocytopenia.  Her most recent level was 89, which does not pose a significant bleeding risk.  However, if it drops much further it may not allow her to have neuraxial analgesia for her repeat LTCS.  We discussed the possibility of giving her steroids close to delivery to increase her platelet count if it is too low for neuraxial analgesia.  However, Emily Morse indicated that she would prefer general anesthesia rather than neuraxial analgesia.  She should have a anesthesiology consult when she gets closer to  delivery to formulate the anesthesia plan for her LTCS.  A CBC should be checked approximately every 4-6 weeks.  If she decides against neuraxial anesthesia she does not require treatment for her platelets unless they drop below 50.  I spent 30 minutes with Emily Morse today of which 50% was face-to-face counseling.  [facility].  Please do not hesitate to contact us with questions.   Rema FendtNITSCHE,Emiah Pellicano, MD

## 2014-08-24 ENCOUNTER — Encounter: Payer: Medicaid Other | Admitting: Obstetrics

## 2014-09-01 ENCOUNTER — Encounter: Payer: Medicaid Other | Admitting: Obstetrics

## 2014-09-07 ENCOUNTER — Ambulatory Visit (INDEPENDENT_AMBULATORY_CARE_PROVIDER_SITE_OTHER): Payer: Medicaid Other | Admitting: Obstetrics

## 2014-09-07 ENCOUNTER — Encounter: Payer: Self-pay | Admitting: Obstetrics

## 2014-09-07 VITALS — BP 104/58 | HR 74 | Temp 98.4°F | Wt 128.0 lb

## 2014-09-07 DIAGNOSIS — Z3482 Encounter for supervision of other normal pregnancy, second trimester: Secondary | ICD-10-CM

## 2014-09-07 DIAGNOSIS — Z331 Pregnant state, incidental: Secondary | ICD-10-CM

## 2014-09-07 DIAGNOSIS — O99112 Other diseases of the blood and blood-forming organs and certain disorders involving the immune mechanism complicating pregnancy, second trimester: Secondary | ICD-10-CM

## 2014-09-07 DIAGNOSIS — D696 Thrombocytopenia, unspecified: Secondary | ICD-10-CM

## 2014-09-07 DIAGNOSIS — Z1389 Encounter for screening for other disorder: Secondary | ICD-10-CM

## 2014-09-07 LAB — POCT URINALYSIS DIPSTICK
Bilirubin, UA: NEGATIVE
Blood, UA: NEGATIVE
Glucose, UA: NEGATIVE
Ketones, UA: NEGATIVE
Leukocytes, UA: NEGATIVE
Nitrite, UA: NEGATIVE
Protein, UA: NEGATIVE
Spec Grav, UA: 1.015
Urobilinogen, UA: NEGATIVE
pH, UA: 7

## 2014-09-07 NOTE — Progress Notes (Signed)
Subjective:    Emily Morse is a 29 y.o. female being seen today for her obstetrical visit. She is at 4439w4d gestation. Patient reports: no complaints . Fetal movement: normal.  Problem List Items Addressed This Visit    None    Visit Diagnoses    Gestational thrombocytopenia, second trimester    -  Primary    Relevant Orders    CBC    Encounter for supervision of other normal pregnancy in second trimester        Relevant Orders    POCT urinalysis dipstick (Completed)      Patient Active Problem List   Diagnosis Date Noted  . Hereditary disease in family possibly affecting fetus, affecting management of mother, antepartum condition or complication   . Encounter for fetal anatomic survey   . [redacted] weeks gestation of pregnancy   . Thrombocytopenia 02/18/2014  . Pain aggravated by activities of daily living 02/18/2014  . Anemia, postpartum 12/09/2013  . Active labor 12/07/2013  . S/P cesarean section 12/07/2013  . Thrombocytopenia complicating pregnancy 11/18/2013  . Unspecified high-risk pregnancy 08/04/2013  . H/O thrombocytopenia 08/04/2013  . GERD without esophagitis 08/04/2013   Objective:    BP 104/58 mmHg  Pulse 74  Temp(Src) 98.4 F (36.9 C)  Wt 128 lb (58.06 kg)  LMP 03/12/2014 FHT: 150 BPM  Uterine Size: size equals dates     Assessment:    Pregnancy @ 2339w4d     Gestational Thrombocytopenia.  Plan:    OBGCT: ordered.  CBC ordered Labs, problem list reviewed and updated 2 hr GTT planned Follow up in 2 weeks.

## 2014-09-08 LAB — CBC
HCT: 31 % — ABNORMAL LOW (ref 36.0–46.0)
Hemoglobin: 10.3 g/dL — ABNORMAL LOW (ref 12.0–15.0)
MCH: 29.8 pg (ref 26.0–34.0)
MCHC: 33.2 g/dL (ref 30.0–36.0)
MCV: 89.6 fL (ref 78.0–100.0)
MPV: 12 fL (ref 8.6–12.4)
Platelets: 79 10*3/uL — ABNORMAL LOW (ref 150–400)
RBC: 3.46 MIL/uL — ABNORMAL LOW (ref 3.87–5.11)
RDW: 14 % (ref 11.5–15.5)
WBC: 5.7 10*3/uL (ref 4.0–10.5)

## 2014-09-09 ENCOUNTER — Telehealth: Payer: Self-pay | Admitting: *Deleted

## 2014-09-09 NOTE — Telephone Encounter (Signed)
Patient called stating she has had some severe gas and darker stools.  Contacted patient and she states she is having bowel movements but is unable to go much. Patient advised to increase fluid. Patient advised to try milk of magnesia now and then colace daily. Patient verbalized understanding.

## 2014-09-21 ENCOUNTER — Encounter: Payer: Self-pay | Admitting: Obstetrics

## 2014-09-29 ENCOUNTER — Ambulatory Visit (INDEPENDENT_AMBULATORY_CARE_PROVIDER_SITE_OTHER): Payer: Medicaid Other | Admitting: Obstetrics

## 2014-09-29 ENCOUNTER — Encounter: Payer: Self-pay | Admitting: Obstetrics

## 2014-09-29 VITALS — BP 101/60 | HR 75 | Temp 96.9°F | Wt 128.0 lb

## 2014-09-29 DIAGNOSIS — D696 Thrombocytopenia, unspecified: Secondary | ICD-10-CM

## 2014-09-29 DIAGNOSIS — Z331 Pregnant state, incidental: Secondary | ICD-10-CM

## 2014-09-29 DIAGNOSIS — O99113 Other diseases of the blood and blood-forming organs and certain disorders involving the immune mechanism complicating pregnancy, third trimester: Secondary | ICD-10-CM

## 2014-09-29 DIAGNOSIS — Z3483 Encounter for supervision of other normal pregnancy, third trimester: Secondary | ICD-10-CM

## 2014-09-29 DIAGNOSIS — Z1389 Encounter for screening for other disorder: Secondary | ICD-10-CM

## 2014-09-29 LAB — POCT URINALYSIS DIPSTICK
Bilirubin, UA: NEGATIVE
Blood, UA: NEGATIVE
Ketones, UA: NEGATIVE
Leukocytes, UA: NEGATIVE
Nitrite, UA: NEGATIVE
Protein, UA: NEGATIVE
Spec Grav, UA: <=1.005
Urobilinogen, UA: NEGATIVE
pH, UA: 8

## 2014-09-29 NOTE — Progress Notes (Signed)
Subjective:    Emily Morse is a 29 y.o. female being seen today for her obstetrical visit. She is at [redacted]w[redacted]d gestation. Patient reports no complaints. Fetal movement: normal.  Problem List Items Addressed This Visit    None    Visit Diagnoses    Supervision of other normal pregnancy, antepartum, second trimester    -  Primary    Relevant Orders    POCT urinalysis dipstick (Completed)      Patient Active Problem List   Diagnosis Date Noted  . Hereditary disease in family possibly affecting fetus, affecting management of mother, antepartum condition or complication   . Encounter for fetal anatomic survey   . [redacted] weeks gestation of pregnancy   . Thrombocytopenia 02/18/2014  . Pain aggravated by activities of daily living 02/18/2014  . Anemia, postpartum 12/09/2013  . Active labor 12/07/2013  . S/P cesarean section 12/07/2013  . Thrombocytopenia complicating pregnancy 11/18/2013  . Unspecified high-risk pregnancy 08/04/2013  . H/O thrombocytopenia 08/04/2013  . GERD without esophagitis 08/04/2013   Objective:    BP 101/60 mmHg  Pulse 75  Temp(Src) 96.9 F (36.1 C)  Wt 128 lb (58.06 kg)  LMP 03/12/2014 FHT:  150 BPM  Uterine Size: size equals dates  Presentation: unsure     Assessment:    Pregnancy @ [redacted]w[redacted]d weeks   Plan:     labs reviewed, problem list updated Consent signed. GBS sent TDAP offered  Rhogam given for RH negative Pediatrician: discussed. Infant feeding: plans to breastfeed. Maternity leave: discussed. Cigarette smoking: former smoker. Orders Placed This Encounter  Procedures  . POCT urinalysis dipstick   No orders of the defined types were placed in this encounter.   Follow up in 2 Weeks.

## 2014-10-04 ENCOUNTER — Telehealth: Payer: Self-pay

## 2014-10-04 ENCOUNTER — Ambulatory Visit (INDEPENDENT_AMBULATORY_CARE_PROVIDER_SITE_OTHER): Payer: Medicaid Other | Admitting: Obstetrics

## 2014-10-04 ENCOUNTER — Encounter: Payer: Self-pay | Admitting: Obstetrics

## 2014-10-04 ENCOUNTER — Other Ambulatory Visit: Payer: Medicaid Other

## 2014-10-04 VITALS — BP 107/70 | HR 73 | Temp 97.3°F | Wt 133.0 lb

## 2014-10-04 DIAGNOSIS — Z1389 Encounter for screening for other disorder: Secondary | ICD-10-CM

## 2014-10-04 DIAGNOSIS — Z3483 Encounter for supervision of other normal pregnancy, third trimester: Secondary | ICD-10-CM

## 2014-10-04 DIAGNOSIS — Z331 Pregnant state, incidental: Secondary | ICD-10-CM

## 2014-10-04 DIAGNOSIS — D696 Thrombocytopenia, unspecified: Secondary | ICD-10-CM

## 2014-10-04 DIAGNOSIS — O99113 Other diseases of the blood and blood-forming organs and certain disorders involving the immune mechanism complicating pregnancy, third trimester: Secondary | ICD-10-CM

## 2014-10-04 LAB — POCT URINALYSIS DIPSTICK
Bilirubin, UA: NEGATIVE
Blood, UA: NEGATIVE
Glucose, UA: NEGATIVE
Ketones, UA: NEGATIVE
Leukocytes, UA: NEGATIVE
Nitrite, UA: NEGATIVE
Protein, UA: NEGATIVE
Spec Grav, UA: 1.02
Urobilinogen, UA: NEGATIVE
pH, UA: 6

## 2014-10-04 NOTE — Progress Notes (Signed)
Subjective:    Maisie Fusangina N Basques is a 29 y.o. female being seen today for her obstetrical visit. She is at 6370w3d gestation. Patient reports no complaints. Fetal movement: normal.  Problem List Items Addressed This Visit    Thrombocytopenia complicating pregnancy - Primary   Relevant Orders   Ambulatory referral to Hematology    Other Visit Diagnoses    Encounter for supervision of other normal pregnancy in third trimester        Relevant Orders    POCT urinalysis dipstick (Completed)      Patient Active Problem List   Diagnosis Date Noted  . Hereditary disease in family possibly affecting fetus, affecting management of mother, antepartum condition or complication   . Encounter for fetal anatomic survey   . [redacted] weeks gestation of pregnancy   . Thrombocytopenia 02/18/2014  . Pain aggravated by activities of daily living 02/18/2014  . Anemia, postpartum 12/09/2013  . Active labor 12/07/2013  . S/P cesarean section 12/07/2013  . Thrombocytopenia complicating pregnancy 11/18/2013  . Unspecified high-risk pregnancy 08/04/2013  . H/O thrombocytopenia 08/04/2013  . GERD without esophagitis 08/04/2013   Objective:    BP 107/70 mmHg  Pulse 73  Temp(Src) 97.3 F (36.3 C)  Wt 133 lb (60.328 kg)  LMP 03/12/2014 FHT:  150 BPM  Uterine Size: size equals dates  Presentation: unsure     Assessment:    Pregnancy @ 7770w3d weeks    Gestational Thrombocytopenia.  Stable.  Platelets decreased to 79,000.   Plan:   Consulted with MFM and steroids recommended when platelets < 70,000. Repeat platelet count q 2 weeks.  Referred to Hematology for further evaluation.    labs reviewed, problem list updated Consent signed. TDAP offered  Rhogam given if RH negative Pediatrician: discussed. Infant feeding: plans to breastfeed. Maternity leave: discussed. Cigarette smoking: former smoker.   Orders Placed This Encounter  Procedures  . Ambulatory referral to Hematology    Referral Priority:   Routine    Referral Type:  Consultation    Referral Reason:  Specialty Services Required    Requested Specialty:  Oncology    Number of Visits Requested:  1  . POCT urinalysis dipstick   No orders of the defined types were placed in this encounter.   Follow up in 2 Weeks.

## 2014-10-04 NOTE — Telephone Encounter (Signed)
NEED TO SCHEDULE THIS REFERRAL SOON PER DR [name] -URGENT

## 2014-10-05 ENCOUNTER — Telehealth: Payer: Self-pay | Admitting: Hematology and Oncology

## 2014-10-05 ENCOUNTER — Other Ambulatory Visit: Payer: Self-pay | Admitting: Certified Nurse Midwife

## 2014-10-05 ENCOUNTER — Telehealth: Payer: Self-pay

## 2014-10-05 LAB — GLUCOSE TOLERANCE, 2 HOURS W/ 1HR
Glucose, 1 hour: 105 mg/dL (ref 70–170)
Glucose, 2 hour: 81 mg/dL (ref 70–139)
Glucose, Fasting: 71 mg/dL (ref 70–99)

## 2014-10-05 LAB — CBC
HCT: 30.8 % — ABNORMAL LOW (ref 36.0–46.0)
Hemoglobin: 10.4 g/dL — ABNORMAL LOW (ref 12.0–15.0)
MCH: 30.4 pg (ref 26.0–34.0)
MCHC: 33.8 g/dL (ref 30.0–36.0)
MCV: 90.1 fL (ref 78.0–100.0)
MPV: 11.3 fL (ref 8.6–12.4)
Platelets: 63 10*3/uL — ABNORMAL LOW (ref 150–400)
RBC: 3.42 MIL/uL — ABNORMAL LOW (ref 3.87–5.11)
RDW: 14.5 % (ref 11.5–15.5)
WBC: 6 10*3/uL (ref 4.0–10.5)

## 2014-10-05 LAB — HIV ANTIBODY (ROUTINE TESTING W REFLEX): HIV 1&2 Ab, 4th Generation: NONREACTIVE

## 2014-10-05 LAB — RPR

## 2014-10-05 NOTE — Telephone Encounter (Signed)
[facility]10[facility]

## 2014-10-05 NOTE — Telephone Encounter (Signed)
new patient appt-s/w patient and gave np appt for 07/06 @ 9:30 w/Dr. Bertis RuddyGorsuch

## 2014-10-06 ENCOUNTER — Telehealth: Payer: Self-pay

## 2014-10-06 NOTE — Telephone Encounter (Signed)
[place] 10/12/14, [facility] - they are also calling [facility] to confirm, gave them their address, phone, etc.

## 2014-10-12 ENCOUNTER — Telehealth: Payer: Self-pay | Admitting: Hematology and Oncology

## 2014-10-12 ENCOUNTER — Ambulatory Visit: Payer: Medicaid Other

## 2014-10-12 ENCOUNTER — Ambulatory Visit (HOSPITAL_BASED_OUTPATIENT_CLINIC_OR_DEPARTMENT_OTHER): Payer: Medicaid Other | Admitting: Hematology and Oncology

## 2014-10-12 ENCOUNTER — Telehealth: Payer: Self-pay | Admitting: *Deleted

## 2014-10-12 ENCOUNTER — Encounter: Payer: Self-pay | Admitting: Hematology and Oncology

## 2014-10-12 ENCOUNTER — Ambulatory Visit (HOSPITAL_BASED_OUTPATIENT_CLINIC_OR_DEPARTMENT_OTHER): Payer: Medicaid Other

## 2014-10-12 VITALS — BP 112/65 | HR 76 | Temp 98.6°F | Resp 18 | Ht 64.0 in | Wt 134.7 lb

## 2014-10-12 DIAGNOSIS — O99113 Other diseases of the blood and blood-forming organs and certain disorders involving the immune mechanism complicating pregnancy, third trimester: Secondary | ICD-10-CM

## 2014-10-12 DIAGNOSIS — O99013 Anemia complicating pregnancy, third trimester: Secondary | ICD-10-CM | POA: Diagnosis not present

## 2014-10-12 DIAGNOSIS — D696 Thrombocytopenia, unspecified: Secondary | ICD-10-CM

## 2014-10-12 LAB — CBC WITH DIFFERENTIAL/PLATELET
BASO%: 0.2 % (ref 0.0–2.0)
Basophils Absolute: 0 10*3/uL (ref 0.0–0.1)
EOS%: 0.9 % (ref 0.0–7.0)
Eosinophils Absolute: 0.1 10*3/uL (ref 0.0–0.5)
HCT: 32.7 % — ABNORMAL LOW (ref 34.8–46.6)
HGB: 11 g/dL — ABNORMAL LOW (ref 11.6–15.9)
LYMPH%: 16.4 % (ref 14.0–49.7)
MCH: 30.4 pg (ref 25.1–34.0)
MCHC: 33.6 g/dL (ref 31.5–36.0)
MCV: 90.3 fL (ref 79.5–101.0)
MONO#: 0.3 10*3/uL (ref 0.1–0.9)
MONO%: 5.9 % (ref 0.0–14.0)
NEUT#: 4.5 10*3/uL (ref 1.5–6.5)
NEUT%: 76.6 % (ref 38.4–76.8)
Platelets: 63 10*3/uL — ABNORMAL LOW (ref 145–400)
RBC: 3.62 10*6/uL — ABNORMAL LOW (ref 3.70–5.45)
RDW: 14.3 % (ref 11.2–14.5)
WBC: 5.8 10*3/uL (ref 3.9–10.3)
lymph#: 1 10*3/uL (ref 0.9–3.3)
nRBC: 0 % (ref 0–0)

## 2014-10-12 LAB — IRON AND TIBC CHCC
%SAT: 14 % — ABNORMAL LOW (ref 21–57)
Iron: 55 ug/dL (ref 41–142)
TIBC: 383 ug/dL (ref 236–444)
UIBC: 327 ug/dL (ref 120–384)

## 2014-10-12 LAB — FERRITIN CHCC: Ferritin: 10 ng/ml (ref 9–269)

## 2014-10-12 MED ORDER — PREDNISONE 20 MG PO TABS: 60.0000 mg | ORAL_TABLET | Freq: Every day | ORAL | Status: DC

## 2014-10-12 NOTE — Telephone Encounter (Signed)
-----   Message from Artis DelayNi Gorsuch, MD sent at 10/12/2014 11:26 AM EDT ----- Regarding: low iron Pls tell her to get OTC iron cheapest she can find and take 1 tab at night when she goes to bed Iron is low ----- Message -----    From: [facility]    Sent: 10/12/2014  10:03 AM      To: Artis DelayNi Gorsuch, MD

## 2014-10-12 NOTE — Telephone Encounter (Signed)
Notified Bunnie about OTC Iron.  Drink orange juice with iron for better absorption.  Iron puts most at risk of constipation so drink lots of water.  Stools may turn dark green or black with use of iron.  Denies questions at this time.

## 2014-10-12 NOTE — Telephone Encounter (Signed)
Left message with mother for patient to call us.

## 2014-10-12 NOTE — Telephone Encounter (Signed)
Gave and printeda ppt sched and avs for pt for July and AUG °

## 2014-10-12 NOTE — Progress Notes (Signed)
Checked in new pt with no financial concerns. °

## 2014-10-12 NOTE — Assessment & Plan Note (Signed)
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 The patient denies recent history of bleeding such as epistaxis, hematuria or hematochezia. She is asymptomatic from the anemia. We will observe for now.  I will order iron studies. She will continue prenatal vitamin for now.

## 2014-10-12 NOTE — Assessment & Plan Note (Signed)
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 Previously, with the first pregnancy, she responded well to prednisone, bringing her platelet count close to 100,000. I think it is reasonable to try prednisone again. I will start her on 60 mg daily by mouth and she will return on a weekly basis for CBC check. I will titrate the dose of prednisone depending on response. If she has less than optimal response to prednisone, we might be able to try IVIG prior to her planned C-section and delivery. The risk, benefit, side effects of prednisone is discussed with the patient and she agreed to proceed.

## 2014-10-12 NOTE — Progress Notes (Signed)
[place] NOTE  Patient Care Team: Shelly Bombard, MD as PCP - General (Obstetrics and Gynecology)  CHIEF COMPLAINTS/PURPOSE OF CONSULTATION:  Recurrent thrombocytopenia with pregnancy and anemia  HISTORY OF PRESENTING ILLNESS:  Emily Morse 29 y.o. female is here because of thrombocytopenia.  She was found to have abnormal CBC from prenatal visits. The patient have prior thrombocytopenia with 2 of her prior pregnancies. I do not have a baseline platelet counts. 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 Her last platelet count on 09/26/2014 was low at 63,000. With her first pregnancy, her daughter was deliver on 10/22/2011 with epidural and C-section. She did receive prednisone prior to delivery and she responded to the treatment. With her second pregnancy, her son was born on 12/07/2013. She never received corticosteroids therapy and underwent a C-section under general anesthesia. With her current pregnancy, she is in the third trimester, with expected due date/planned C-section delivery on 12/17/2014 She is also noted to be anemic. She denies recent bruising/bleeding, such as spontaneous epistaxis, hematuria, melena or hematochezia She denies recent excessive menorrhagia The patient denies history of liver disease, exposure to heparin, history of cardiac murmur/prior cardiovascular surgery or recent new medications She denies prior blood or platelet transfusions She denies symptoms of fatigue.  MEDICAL HISTORY:  Past Medical History  Diagnosis Date  . Herpes     last out break 2008  . Thrombocytopenia     SURGICAL HISTORY: Past Surgical History  Procedure Laterality Date  . Dilation and curettage of uterus    . Cesarean section  10/22/2011    Procedure: CESAREAN SECTION;  Surgeon: Shelly Bombard, MD;  Location: [facility];  Service: Gynecology;  Laterality: N/A;  . Cesarean section N/A 12/07/2013     Procedure: CESAREAN SECTION;  Surgeon: Shelly Bombard, MD;  Location: [facility];  Service: Obstetrics;  Laterality: N/A;    SOCIAL HISTORY: History   Social History  . Marital Status: Single    Spouse Name: N/A  . Number of Children: N/A  . Years of Education: N/A   Occupational History  . Not on file.   Social History Main Topics  . Smoking status: Former Smoker    Quit date: 05/06/2013  . Smokeless tobacco: Never Used  . Alcohol Use: No  . Drug Use: No  . Sexual Activity:    Partners: Male    Birth Control/ Protection: None   Other Topics Concern  . Not on file   Social History Narrative    FAMILY HISTORY: Family History  Problem Relation Age of Onset  . Other Neg Hx   . Alcohol abuse Neg Hx   . Arthritis Neg Hx   . Asthma Neg Hx   . Birth defects Neg Hx   . Cancer Neg Hx   . COPD Neg Hx   . Depression Neg Hx   . Diabetes Neg Hx   . Drug abuse Neg Hx   . Early death Neg Hx   . Hearing loss Neg Hx   . Heart disease Neg Hx   . Hyperlipidemia Neg Hx   . Hypertension Neg Hx   . Kidney disease Neg Hx   . Learning disabilities Neg Hx   . Mental illness Neg Hx   . Mental retardation Neg Hx   . Miscarriages / Stillbirths Neg Hx   . Stroke Neg Hx   . Vision loss Neg Hx   . Varicose Veins  Neg Hx     ALLERGIES:  has No Known Allergies.  MEDICATIONS:  Current Outpatient Prescriptions  Medication Sig Dispense Refill  . Prenat-FeCbn-FeAspGl-FA-Omega (OB COMPLETE PETITE) 35-5-1-200 MG CAPS Take 1 capsule by mouth daily. 30 capsule 11  . predniSONE (DELTASONE) 20 MG tablet Take 3 tablets (60 mg total) by mouth daily with breakfast. 90 tablet 3   No current facility-administered medications for this visit.    REVIEW OF SYSTEMS:   Constitutional: Denies fevers, chills or abnormal night sweats Eyes: Denies blurriness of vision, double vision or watery eyes Ears, nose, mouth, throat, and face: Denies mucositis or sore throat Respiratory: Denies cough, dyspnea or  wheezes Cardiovascular: Denies palpitation, chest discomfort or lower extremity swelling Gastrointestinal:  Denies nausea, heartburn or change in bowel habits Skin: Denies abnormal skin rashes Lymphatics: Denies new lymphadenopathy or easy bruising Neurological:Denies numbness, tingling or new weaknesses Behavioral/Psych: Mood is stable, no new changes  All other systems were reviewed with the patient and are negative.  PHYSICAL EXAMINATION: ECOG PERFORMANCE STATUS: 0 - Asymptomatic  Filed Vitals:   10/12/14 0859  BP: 112/65  Pulse: 76  Temp: 98.6 F (37 C)  Resp: 18   Filed Weights   10/12/14 0859  Weight: 134 lb 11.2 oz (61.1 kg)    GENERAL:alert, no distress and comfortable SKIN: skin color, texture, turgor are normal, no rashes or significant lesions EYES: normal, conjunctiva are pink and non-injected, sclera clear OROPHARYNX:no exudate, no erythema and lips, buccal mucosa, and tongue normal  NECK: supple, thyroid normal size, non-tender, without nodularity LYMPH:  no palpable lymphadenopathy in the cervical, axillary or inguinal LUNGS: clear to auscultation and percussion with normal breathing effort HEART: regular rate & rhythm and no murmurs and no lower extremity edema ABDOMEN:abdomen soft, non-tender and normal bowel sounds. Her abdomen is distended consistent with pregnancy Musculoskeletal:no cyanosis of digits and no clubbing  PSYCH: alert & oriented x 3 with fluent speech NEURO: no focal motor/sensory deficits  LABORATORY DATA:  I have reviewed the data as listed Recent Results (from the past 2160 hour(s))  POCT urinalysis dipstick     Status: None   Collection Time: 07/27/14  1:47 PM  Result Value Ref Range   Color, UA yellow    Clarity, UA clear    Glucose, UA neg    Bilirubin, UA neg    Ketones, UA neg    Spec Grav, UA 1.010    Blood, UA neg    pH, UA 7.0    Protein, UA neg    Urobilinogen, UA 1.0    Nitrite, UA neg    Leukocytes, UA Negative    Obstetric panel     Status: Abnormal   Collection Time: 07/27/14  2:35 PM  Result Value Ref Range   WBC 6.3 4.0 - 10.5 K/uL   RBC 4.12 3.87 - 5.11 MIL/uL   Hemoglobin 11.8 (L) 12.0 - 15.0 g/dL   HCT 35.8 (L) 36.0 - 46.0 %   MCV 86.9 78.0 - 100.0 fL   MCH 28.6 26.0 - 34.0 pg   MCHC 33.0 30.0 - 36.0 g/dL   RDW 15.2 11.5 - 15.5 %   Platelets 89 (L) 150 - 400 K/uL    Comment: Platelet count confirmed on smear   MPV 12.3 8.6 - 12.4 fL   Neutrophils Relative % 75 43 - 77 %   Neutro Abs 4.7 1.7 - 7.7 K/uL   Lymphocytes Relative 19 12 - 46 %   Lymphs Abs 1.2 0.7 -  4.0 K/uL   Monocytes Relative 5 3 - 12 %   Monocytes Absolute 0.3 0.1 - 1.0 K/uL   Eosinophils Relative 1 0 - 5 %   Eosinophils Absolute 0.1 0.0 - 0.7 K/uL   Basophils Relative 0 0 - 1 %   Basophils Absolute 0.0 0.0 - 0.1 K/uL   Smear Review Criteria for review not met    Hepatitis B Surface Ag NEGATIVE NEGATIVE   RPR Ser Ql NON REAC NON REAC   Rubella 3.61 (H) <0.90 Index    Comment:   Reference Range:        <0.90 Index = Not Immune                     0.90-0.99 Index = Equivocal                        >=1.00 Index = Immune      ABO Grouping B    Rh Type POS    Antibody Screen NEG NEGATIVE  HIV antibody     Status: None   Collection Time: 07/27/14  2:35 PM  Result Value Ref Range   HIV 1&2 Ab, 4th Generation NONREACTIVE NONREACTIVE    Comment:   A NONREACTIVE HIV Ag/Ab result does not exclude HIV infection since the time frame for seroconversion is variable. If acute HIV infection is suspected, a HIV-1 RNA Qualitative TMA test is recommended.   HIV-1/2 Antibody Diff         Not indicated. HIV-1 RNA, Qual TMA           Not indicated.   PLEASE NOTE: This information has been disclosed to you from records whose confidentiality may be protected by state law. If your state requires such protection, then the state law prohibits you from making any further disclosure of the information without the specific  written consent of the person to whom it pertains, or as otherwise permitted by law. A general authorization for the release of medical or other information is NOT sufficient for this purpose.   The performance of this assay has not been clinically validated in patients less than 29 years old.   Hemoglobinopathy evaluation     Status: None   Collection Time: 07/27/14  2:35 PM  Result Value Ref Range   Hgb A 97.7 96.8 - 97.8 %   Hgb A2 Quant 2.3 2.2 - 3.2 %    Comment:   Patients with the combination of iron deficiency and B-thalassemia may clinically present with normal A2 level.  An elevated A2 level can not be used to screen for beta-thalassemia in these cases.      Hgb F Quant 0.0 0.0 - 2.0 %   Hgb S Quant 0.0 0.0 %   Hemoglobin Other 0.0 0.0 %    Comment:   Interpretation -------------- Normal study.   Reviewed by Francis Gaines Mammarappallil MD (Electronic Signature on File)   Varicella zoster antibody, IgG     Status: Abnormal   Collection Time: 07/27/14  2:35 PM  Result Value Ref Range   Varicella IgG 1852.00 (H) <135.00 Index    Comment:   Reference Range:       <135.00 Index = Negative                  135.00-164.99 Index = Equivocal                       >=  165.00 Index = Positive     Vit D  25 hydroxy (rtn osteoporosis monitoring)     Status: Abnormal   Collection Time: 07/27/14  2:35 PM  Result Value Ref Range   Vit D, 25-Hydroxy 26 (L) 30 - 100 ng/mL    Comment: Vitamin D Status           25-OH Vitamin D        Deficiency                <20 ng/mL        Insufficiency         20 - 29 ng/mL        Optimal             > or = 30 ng/mL   For 25-OH Vitamin D testing on patients on D2-supplementation and patients for whom quantitation of D2 and D3 fractions is required, the QuestAssureD 25-OH VIT D, (D2,D3), LC/MS/MS is recommended: order code [id number] (patients > 2 yrs).   Culture, OB Urine     Status: None   Collection Time: 07/27/14  2:35 PM  Result Value Ref  Range   Colony Count NO GROWTH    Organism ID, Bacteria NO GROWTH   AFP, Quad Screen     Status: None   Collection Time: 07/27/14  2:35 PM  Result Value Ref Range   Interpretation-AFP SCREEN NEG SCREEN NEG    Comment:                             The risk of Down syndrome is LESS than                             the screening cut-off. No follow-up is                             indicated regarding this result.      Open Spina bifida SCREEN NEG SCREEN NEG    Comment:                             The maternal serum AFP result is NOT                             elevated for a pregnancy of this                             gestational age. The risk of an open                             neural tube defect is less than the                             screening cut-off.   Interpretation Disclaimer: Result Interpretation is dependent on accurate gestational age ([place]) provided based on the date ultrasound was performed, not the gestational age ([place]) at the time of sample collection.  If ultrasound was not performed, interpretation is based on Estimated Date of Delivery (EDD) or LMP.  Please verify gestational age ([place]) if clinically indicated. ----------------------------    Tri 18  Scr Risk Est SCREEN NEG SCREEN NEG    Comment:                             These marker values are not consistent                             with the pattern seen in Trisomy 18                             pregnancies.  Maternal serum screening                             will detect about 60% of Trisomy 18                             pregnancies.   Interpretation Disclaimer: Result Interpretation is dependent on accurate gestational age ([place]) provided based on the date ultrasound was performed, not the gestational age ([place]) at the time of sample collection.  If ultrasound was not performed, interpretation is based on Estimated Date of Delivery (EDD) or LMP.  Please verify gestational age ([place]) if clinically  indicated. ----------------------------    Osb Risk 1:37000    Down Syndrome Scr Risk Est 1:833    Age Alone 1:962    Trisomy 39 ([place]) Syndrome Interp. 1:58400    Curr Gest Age 50.4 wks.days   AFP 60.2 ng/mL   MoM for AFP 0.91    uE3 Value 1.49 ng/mL   uE3 Mom 0.84    HCG, Total 35.96 IU/mL   MoM for hCG 1.33    INH 353.0 pg/mL   MoM for INH 1.89   SureSwab, Vaginosis/Vaginitis Plus     Status: None   Collection Time: 07/27/14  4:49 PM  Result Value Ref Range   C. trachomatis RNA, TMA Not Detected Not Detected   N. gonorrhoeae RNA, TMA Not Detected Not Detected    Comment: This test was performed using the APTIMA COMBO2(R) Assay (GEN-PROBE(R)). The analytical performance characteristics of this assay, when used to test SurePath(R) specimens have been determined by [employer].    BV CATEGORY REPORT     Comment: BV Category                NOT SUPPORTIVE                      Reference range:  NOT SUPPORTIVE    LACTOBACILLUS SPECIES >8.0 Log (cells/mL)   Atopobium vaginae Not Detected Log (cells/mL)   MEGASPHAERA SPECIES Not Detected Log (cells/mL)   Gardnerella vaginalis Not Detected Log (cells/mL)    Comment: NOT SUPPORTIVE OF BV: The pattern of results is not supportive of a diagnosis of BV: 1)Presence of Lactobacillus spp., G. vaginalis levels less than 6.0 log cells/mL, and absence of A. vaginae and Megasphaera spp; or 2)Absence of all targeted organisms; or 3)Absence of Lactobacillus spp.  plus G. vaginalis detected at levels less than 6.0 log cells/mL and absence of A. vaginae and Megasphaera spp. EQUIVOCAL FOR BV: The pattern of results is neither supportive nor not supportive of a diagnosis of BV. The patient may be in transition into or out of BV: Presence of Lactobacillus spp. plus G. vaginalis (greater or equal to 6.0  log cells/mL) and/or one of the other BV-associated pathogens. SUPPORTIVE OF BV: The pattern of results is supportive of a diagnosis  of BV: Absence of Lactobacillus spp. and presence of G. vaginalis greater than or equal to 6.0 log cells/mL and/or one or both of the other BV-associated pathogens. Concentration for Lactobacilli (L. acidophilus/ cri spatus, L. jensenii) are collectively reported under the term "Lactobacillus spp.", as these species are among the peroxide producing Lactobacilli thought to be protective against bacterial vaginosis. Atopobium vaginae, Megasphaera spp., and Gardnerella (greater than 6.0 log cells/mL) have been associated with vaginosis when present in the absence of peroxide producing Lactobacilli. This test was developed and its analytical performance characteristics have been determined by [facility] [employer], [place], [place]. It has not been cleared or approved by the FDA. This assay has been validated pursuant to the CLIA regulations and is used for clinical purposes.    T. vaginalis RNA, QL TMA Not Detected Not Detected    Comment: This test was performed using the APTIMA Trichomonas vaginalis Assay (Gen-Probe). For more information on this test, go to http://education.questdiagnostics.com/faq/ Trichomonastma    C. albicans, DNA Not Detected Not Detected   C. glabrata, DNA Not Detected Not Detected   C. tropicalis, DNA Not Detected Not Detected   C. parapsilosis, DNA Not Detected Not Detected    Comment: This test was developed and its analytical performance characteristics have been determined by [employer], [place], [place]. It has not been cleared or approved by the FDA. This assay has been validated pursuant to the CLIA regulations and is used for clinical purposes.   CBC     Status: Abnormal   Collection Time: 09/07/14  3:31 PM  Result Value Ref Range   WBC 5.7 4.0 - 10.5 K/uL   RBC 3.46 (L) 3.87 - 5.11 MIL/uL   Hemoglobin 10.3 (L) 12.0 - 15.0 g/dL   HCT 31.0 (L) 36.0 - 46.0 %   MCV 89.6 78.0 - 100.0 fL   MCH 29.8 26.0 - 34.0  pg   MCHC 33.2 30.0 - 36.0 g/dL   RDW 14.0 11.5 - 15.5 %   Platelets 79 (L) 150 - 400 K/uL    Comment: Platelet count confirmed on smear   MPV 12.0 8.6 - 12.4 fL  POCT urinalysis dipstick     Status: None   Collection Time: 09/07/14  4:42 PM  Result Value Ref Range   Color, UA yellow    Clarity, UA cloudy    Glucose, UA neg    Bilirubin, UA neg    Ketones, UA neg    Spec Grav, UA 1.015    Blood, UA neg    pH, UA 7.0    Protein, UA neg    Urobilinogen, UA negative    Nitrite, UA neg    Leukocytes, UA Negative   POCT urinalysis dipstick     Status: None   Collection Time: 09/29/14  2:33 PM  Result Value Ref Range   Color, UA yel    Clarity, UA clear    Glucose, UA nor    Bilirubin, UA neg    Ketones, UA neg    Spec Grav, UA <=1.005    Blood, UA neg    pH, UA 8.0    Protein, UA neg    Urobilinogen, UA negative    Nitrite, UA neg    Leukocytes, UA Negative Negative  POCT urinalysis dipstick     Status: None   Collection Time: 10/04/14 10:08  AM  Result Value Ref Range   Color, UA yellow    Clarity, UA clear    Glucose, UA negative    Bilirubin, UA negative    Ketones, UA negative    Spec Grav, UA 1.020    Blood, UA negative    pH, UA 6.0    Protein, UA negative    Urobilinogen, UA negative    Nitrite, UA negative    Leukocytes, UA Negative Negative  Glucose Tolerance, 2 Hours w/1 Hour     Status: None   Collection Time: 10/04/14 11:58 AM  Result Value Ref Range   Glucose, Fasting 71 70 - 99 mg/dL    Comment:     < 100  mg/dL = normal fasting glucose 100-125  mg/dL = IFG (impaired fasting glucose)   > 125  mg/dL = provisional diagnosis of diabetes      Glucose, 1 hour 105 70 - 170 mg/dL   Glucose, 2 hour 81 70 - 139 mg/dL    Comment:     < 140  mg/dL = normal 2 hour GT glucose 140-199  mg/dL = IGT (impaired glucose tolerance)   > 199  mg/dL = provisional diagnosis of diabetes     CBC     Status: Abnormal   Collection Time: 10/04/14 11:58 AM  Result  Value Ref Range   WBC 6.0 4.0 - 10.5 K/uL   RBC 3.42 (L) 3.87 - 5.11 MIL/uL   Hemoglobin 10.4 (L) 12.0 - 15.0 g/dL   HCT 30.8 (L) 36.0 - 46.0 %   MCV 90.1 78.0 - 100.0 fL   MCH 30.4 26.0 - 34.0 pg   MCHC 33.8 30.0 - 36.0 g/dL   RDW 14.5 11.5 - 15.5 %   Platelets 63 (L) 150 - 400 K/uL    Comment: Platelet count confirmed on smear   MPV 11.3 8.6 - 12.4 fL  HIV antibody     Status: None   Collection Time: 10/04/14 11:58 AM  Result Value Ref Range   HIV 1&2 Ab, 4th Generation NONREACTIVE NONREACTIVE    Comment:   HIV-1 antigen and HIV-1/HIV-2 antibodies were not detected.  There is no laboratory evidence of HIV infection.   HIV-1/2 Antibody Diff        Not indicated. HIV-1 RNA, Qual TMA          Not indicated.     PLEASE NOTE: This information has been disclosed to you from records whose confidentiality may be protected by state law. If your state requires such protection, then the state law prohibits you from making any further disclosure of the information without the specific written consent of the person to whom it pertains, or as otherwise permitted by law. A general authorization for the release of medical or other information is NOT sufficient for this purpose.   The performance of this assay has not been clinically validated in patients less than 75 years old.   For additional information please refer to http://education.questdiagnostics.com/faq/FAQ106.  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 Previously, with the first pregnancy, she responded well to prednisone, bringing her platelet count close to 100,000.  I think it is reasonable to try prednisone  again. I will start her on 60 mg daily by mouth and she will return on a weekly basis for CBC check. I will titrate the dose of prednisone depending on response. If she has less than optimal response to prednisone, we might be able to try IVIG prior to her planned C-section and delivery. The risk, benefit, side effects of prednisone is discussed with the patient and she agreed to proceed.  Anemia affecting pregnancy in third trimester, antepartum This is likely anemia of chronic disease related to pregnancy. The patient denies recent history of bleeding such as epistaxis, hematuria or hematochezia. She is asymptomatic from the anemia. We will observe for now.  I will order iron studies. She will continue prenatal vitamin for now.

## 2014-10-18 ENCOUNTER — Encounter: Payer: Medicaid Other | Admitting: Obstetrics

## 2014-10-19 ENCOUNTER — Telehealth: Payer: Self-pay | Admitting: Hematology and Oncology

## 2014-10-19 ENCOUNTER — Telehealth: Payer: Self-pay | Admitting: *Deleted

## 2014-10-19 ENCOUNTER — Other Ambulatory Visit (HOSPITAL_BASED_OUTPATIENT_CLINIC_OR_DEPARTMENT_OTHER): Payer: Medicaid Other

## 2014-10-19 ENCOUNTER — Other Ambulatory Visit: Payer: Self-pay | Admitting: Obstetrics

## 2014-10-19 DIAGNOSIS — D696 Thrombocytopenia, unspecified: Secondary | ICD-10-CM

## 2014-10-19 DIAGNOSIS — O99113 Other diseases of the blood and blood-forming organs and certain disorders involving the immune mechanism complicating pregnancy, third trimester: Secondary | ICD-10-CM | POA: Diagnosis present

## 2014-10-19 DIAGNOSIS — O99013 Anemia complicating pregnancy, third trimester: Secondary | ICD-10-CM

## 2014-10-19 LAB — CBC WITH DIFFERENTIAL/PLATELET
BASO%: 0.2 % (ref 0.0–2.0)
Basophils Absolute: 0 10*3/uL (ref 0.0–0.1)
EOS%: 0.7 % (ref 0.0–7.0)
Eosinophils Absolute: 0.1 10*3/uL (ref 0.0–0.5)
HCT: 29.5 % — ABNORMAL LOW (ref 34.8–46.6)
HGB: 9.7 g/dL — ABNORMAL LOW (ref 11.6–15.9)
LYMPH%: 21.2 % (ref 14.0–49.7)
MCH: 29.8 pg (ref 25.1–34.0)
MCHC: 32.8 g/dL (ref 31.5–36.0)
MCV: 90.8 fL (ref 79.5–101.0)
MONO#: 0.4 10*3/uL (ref 0.1–0.9)
MONO%: 6.1 % (ref 0.0–14.0)
NEUT#: 5.2 10*3/uL (ref 1.5–6.5)
NEUT%: 71.8 % (ref 38.4–76.8)
Platelets: 74 10*3/uL — ABNORMAL LOW (ref 145–400)
RBC: 3.25 10*6/uL — ABNORMAL LOW (ref 3.70–5.45)
RDW: 13.9 % (ref 11.2–14.5)
WBC: 7.3 10*3/uL (ref 3.9–10.3)
lymph#: 1.5 10*3/uL (ref 0.9–3.3)

## 2014-10-19 MED ORDER — FUSION PLUS PO CAPS: 1.0000 | ORAL_CAPSULE | Freq: Every day | ORAL | Status: DC

## 2014-10-19 NOTE — Telephone Encounter (Signed)
[facility].

## 2014-10-19 NOTE — Telephone Encounter (Signed)
I spoke with the patient and review test results. She has not started taking oral iron supplement yet. She is a little bit more anemic and I encouraged her to start oral iron supplement immediately. Her blood count has responded to prednisone and she will continue at 60 mg daily She tolerated prednisone well.

## 2014-10-19 NOTE — Telephone Encounter (Signed)
Patient is requesting iron Rx- see labs

## 2014-10-20 NOTE — Telephone Encounter (Signed)
Patient notified

## 2014-10-25 ENCOUNTER — Ambulatory Visit (INDEPENDENT_AMBULATORY_CARE_PROVIDER_SITE_OTHER): Payer: Medicaid Other | Admitting: Obstetrics

## 2014-10-25 ENCOUNTER — Encounter: Payer: Self-pay | Admitting: Obstetrics

## 2014-10-25 VITALS — BP 116/75 | HR 75 | Temp 97.8°F | Wt 138.6 lb

## 2014-10-25 DIAGNOSIS — O99113 Other diseases of the blood and blood-forming organs and certain disorders involving the immune mechanism complicating pregnancy, third trimester: Secondary | ICD-10-CM | POA: Diagnosis not present

## 2014-10-25 DIAGNOSIS — D696 Thrombocytopenia, unspecified: Secondary | ICD-10-CM | POA: Diagnosis not present

## 2014-10-25 DIAGNOSIS — G4700 Insomnia, unspecified: Secondary | ICD-10-CM | POA: Diagnosis not present

## 2014-10-25 LAB — POCT URINALYSIS DIPSTICK
Bilirubin, UA: NEGATIVE
Blood, UA: NEGATIVE
Glucose, UA: NEGATIVE
Ketones, UA: NEGATIVE
Leukocytes, UA: NEGATIVE
Nitrite, UA: NEGATIVE
Protein, UA: NEGATIVE
Spec Grav, UA: 1.015
Urobilinogen, UA: NEGATIVE
pH, UA: 7

## 2014-10-25 MED ORDER — ZOLPIDEM TARTRATE 10 MG PO TABS: 10.0000 mg | ORAL_TABLET | Freq: Every evening | ORAL | Status: AC | PRN

## 2014-10-25 MED ORDER — ZOLPIDEM TARTRATE 5 MG PO TABS: 5.0000 mg | ORAL_TABLET | Freq: Every evening | ORAL | Status: DC | PRN

## 2014-10-25 NOTE — Addendum Note (Signed)
Addended by: Henriette CombsHATTON, Ebert Forrester L on: 10/25/2014 05:09 PM   Modules accepted: Orders

## 2014-10-25 NOTE — Progress Notes (Signed)
Subjective:    Emily Morse is a 29 y.o. female being seen today for her obstetrical visit. She is at 4566w3d gestation. Patient reports no complaints. Fetal movement: normal.  Problem List Items Addressed This Visit    None    Visit Diagnoses    Gestational thrombocytopenia without hemorrhage in third trimester    -  Primary    Relevant Orders    US OB Comp + 14 Wk    Insomnia        Relevant Medications    zolpidem (AMBIEN) 10 MG tablet    SGA (small for gestational age)        Relevant Orders    US OB Comp + 14 Wk      Patient Active Problem List   Diagnosis Date Noted  . Anemia affecting pregnancy in third trimester, antepartum 10/12/2014  . Hereditary disease in family possibly affecting fetus, affecting management of mother, antepartum condition or complication   . Encounter for fetal anatomic survey   . [redacted] weeks gestation of pregnancy   . Thrombocytopenia 02/18/2014  . Pain aggravated by activities of daily living 02/18/2014  . Anemia, postpartum 12/09/2013  . Active labor 12/07/2013  . S/P cesarean section 12/07/2013  . Thrombocytopenia complicating pregnancy 11/18/2013  . Unspecified high-risk pregnancy 08/04/2013  . H/O thrombocytopenia 08/04/2013  . GERD without esophagitis 08/04/2013   Objective:    BP 116/75 mmHg  Pulse 75  Temp(Src) 97.8 F (36.6 C)  Wt 138 lb 9.6 oz (62.869 kg)  LMP 03/12/2014 FHT:  150 BPM  Uterine Size: size equals dates  Presentation: unsure     Assessment:    Pregnancy @ 5666w3d weeks   Plan:     labs reviewed, problem list updated Consent signed. GBS sent TDAP offered  Rhogam given for RH negative Pediatrician: discussed. Infant feeding: plans to breastfeed. Maternity leave: discussed. Cigarette smoking: former smoker. Orders Placed This Encounter  Procedures  . US OB Comp + 14 Wk    Standing Status: Future     Number of Occurrences:      Standing Expiration Date: 12/26/2015    Order Specific Question:  Reason for  Exam (SYMPTOM  OR DIAGNOSIS REQUIRED)    Answer:  size<dates.  Gestational thrombocytopenia.    Order Specific Question:  Preferred imaging location?    Answer:  [facility]   Meds ordered this encounter  Medications  . DISCONTD: zolpidem (AMBIEN) 5 MG tablet    Sig: Take 5 mg by mouth at bedtime as needed for sleep.  Marland Kitchen. DISCONTD: zolpidem (AMBIEN) 5 MG tablet    Sig: Take 1 tablet (5 mg total) by mouth at bedtime as needed for sleep.    Dispense:  30 tablet    Refill:  4  . zolpidem (AMBIEN) 10 MG tablet    Sig: Take 1 tablet (10 mg total) by mouth at bedtime as needed for sleep.    Dispense:  30 tablet    Refill:  3   Follow up in 2 Weeks.

## 2014-10-26 ENCOUNTER — Telehealth: Payer: Self-pay | Admitting: *Deleted

## 2014-10-26 ENCOUNTER — Other Ambulatory Visit (HOSPITAL_BASED_OUTPATIENT_CLINIC_OR_DEPARTMENT_OTHER): Payer: Medicaid Other

## 2014-10-26 DIAGNOSIS — O99013 Anemia complicating pregnancy, third trimester: Secondary | ICD-10-CM

## 2014-10-26 DIAGNOSIS — O99113 Other diseases of the blood and blood-forming organs and certain disorders involving the immune mechanism complicating pregnancy, third trimester: Secondary | ICD-10-CM | POA: Diagnosis present

## 2014-10-26 DIAGNOSIS — D696 Thrombocytopenia, unspecified: Secondary | ICD-10-CM | POA: Diagnosis not present

## 2014-10-26 LAB — CBC WITH DIFFERENTIAL/PLATELET
BASO%: 0.1 % (ref 0.0–2.0)
Basophils Absolute: 0 10*3/uL (ref 0.0–0.1)
EOS%: 0.2 % (ref 0.0–7.0)
Eosinophils Absolute: 0 10*3/uL (ref 0.0–0.5)
HCT: 29.6 % — ABNORMAL LOW (ref 34.8–46.6)
HGB: 9.7 g/dL — ABNORMAL LOW (ref 11.6–15.9)
LYMPH%: 16.8 % (ref 14.0–49.7)
MCH: 30 pg (ref 25.1–34.0)
MCHC: 32.8 g/dL (ref 31.5–36.0)
MCV: 91.6 fL (ref 79.5–101.0)
MONO#: 0.8 10*3/uL (ref 0.1–0.9)
MONO%: 8 % (ref 0.0–14.0)
NEUT#: 7.9 10*3/uL — ABNORMAL HIGH (ref 1.5–6.5)
NEUT%: 74.9 % (ref 38.4–76.8)
Platelets: 77 10*3/uL — ABNORMAL LOW (ref 145–400)
RBC: 3.23 10*6/uL — ABNORMAL LOW (ref 3.70–5.45)
RDW: 14.5 % (ref 11.2–14.5)
WBC: 10.5 10*3/uL — ABNORMAL HIGH (ref 3.9–10.3)
lymph#: 1.8 10*3/uL (ref 0.9–3.3)

## 2014-10-26 NOTE — Telephone Encounter (Signed)
S/w pt in lobby.  Copy of CBC given, Platelets 77 today.  Instructed pt to decrease Prednisone to 40 mg daily and continue taking Iron.  Keep lab appt next week as scheduled.  She verbalized understanding.

## 2014-10-27 ENCOUNTER — Ambulatory Visit (HOSPITAL_COMMUNITY)
Admission: RE | Admit: 2014-10-27 | Discharge: 2014-10-27 | Disposition: A | Discharge status: Home / Self Care | Payer: Medicaid Other | Source: Ambulatory Visit | Attending: Obstetrics | Admitting: Obstetrics

## 2014-10-27 ENCOUNTER — Other Ambulatory Visit: Payer: Self-pay | Admitting: Obstetrics

## 2014-10-27 DIAGNOSIS — O99113 Other diseases of the blood and blood-forming organs and certain disorders involving the immune mechanism complicating pregnancy, third trimester: Secondary | ICD-10-CM | POA: Insufficient documentation

## 2014-10-27 DIAGNOSIS — Z3A32 32 weeks gestation of pregnancy: Secondary | ICD-10-CM | POA: Insufficient documentation

## 2014-10-27 DIAGNOSIS — D696 Thrombocytopenia, unspecified: Secondary | ICD-10-CM | POA: Diagnosis not present

## 2014-11-01 ENCOUNTER — Telehealth: Payer: Self-pay | Admitting: Hematology and Oncology

## 2014-11-01 NOTE — Telephone Encounter (Signed)
pt called to cx appt due to transportation problems...pt did not want to r/s

## 2014-11-02 ENCOUNTER — Other Ambulatory Visit: Payer: Medicaid Other

## 2014-11-08 ENCOUNTER — Ambulatory Visit (INDEPENDENT_AMBULATORY_CARE_PROVIDER_SITE_OTHER): Payer: Medicaid Other | Admitting: Certified Nurse Midwife

## 2014-11-08 VITALS — BP 120/74 | HR 75 | Temp 98.0°F | Wt 131.8 lb

## 2014-11-08 DIAGNOSIS — Z3483 Encounter for supervision of other normal pregnancy, third trimester: Secondary | ICD-10-CM

## 2014-11-08 LAB — POCT URINALYSIS DIPSTICK
Bilirubin, UA: NEGATIVE
Blood, UA: NEGATIVE
Ketones, UA: NEGATIVE
Leukocytes, UA: NEGATIVE
Nitrite, UA: NEGATIVE
Protein, UA: NEGATIVE
Spec Grav, UA: 1.015
Urobilinogen, UA: NEGATIVE
pH, UA: 7

## 2014-11-08 NOTE — Progress Notes (Signed)
Subjective:    Emily Morse is a 29 y.o. female being seen today for her obstetrical visit. She is at [redacted]w[redacted]d gestation. Patient reports no complaints. Fetal movement: normal.  Problem List Items Addressed This Visit    None    Visit Diagnoses    Supervision of other normal pregnancy, antepartum, third trimester    -  Primary    Relevant Orders    POCT urinalysis dipstick (Completed)      Patient Active Problem List   Diagnosis Date Noted  . Gestational thrombocytopenia without hemorrhage in third trimester   . SGA (small for gestational age)   . [redacted] weeks gestation of pregnancy   . Anemia affecting pregnancy in third trimester, antepartum 10/12/2014  . Hereditary disease in family possibly affecting fetus, affecting management of mother, antepartum condition or complication   . Encounter for fetal anatomic survey   . [redacted] weeks gestation of pregnancy   . Thrombocytopenia 02/18/2014  . Pain aggravated by activities of daily living 02/18/2014  . Anemia, postpartum 12/09/2013  . Active labor 12/07/2013  . S/P cesarean section 12/07/2013  . Thrombocytopenia complicating pregnancy 11/18/2013  . Unspecified high-risk pregnancy 08/04/2013  . H/O thrombocytopenia 08/04/2013  . GERD without esophagitis 08/04/2013   Objective:    BP 120/74 mmHg  Pulse 75  Temp(Src) 98 F (36.7 C)  Wt 131 lb 12.8 oz (59.784 kg)  LMP 03/12/2014 FHT:  135 BPM  Uterine Size: 33 cm and size less than dates  Presentation: cephalic     Assessment:    Pregnancy @ [redacted]w[redacted]d weeks  Doing well Thrombocytopenia  Plan:     labs reviewed, problem list updated Consent signed. GBS next ROB TDAP offered  Rhogam given for RH negative Pediatrician: discussed. Infant feeding: plans to breastfeed. Maternity leave: N/A. Cigarette smoking: quit at start of pregnancy. Orders Placed This Encounter  Procedures  . POCT urinalysis dipstick   No orders of the defined types were placed in this encounter.    Follow up in 1 Week with GBS cultures.

## 2014-11-09 ENCOUNTER — Other Ambulatory Visit (HOSPITAL_BASED_OUTPATIENT_CLINIC_OR_DEPARTMENT_OTHER): Payer: Medicaid Other

## 2014-11-09 ENCOUNTER — Telehealth: Payer: Self-pay | Admitting: *Deleted

## 2014-11-09 DIAGNOSIS — O99113 Other diseases of the blood and blood-forming organs and certain disorders involving the immune mechanism complicating pregnancy, third trimester: Secondary | ICD-10-CM | POA: Diagnosis present

## 2014-11-09 DIAGNOSIS — O99013 Anemia complicating pregnancy, third trimester: Secondary | ICD-10-CM | POA: Diagnosis not present

## 2014-11-09 DIAGNOSIS — D696 Thrombocytopenia, unspecified: Secondary | ICD-10-CM

## 2014-11-09 LAB — CBC WITH DIFFERENTIAL/PLATELET
BASO%: 0 % (ref 0.0–2.0)
Basophils Absolute: 0 10*3/uL (ref 0.0–0.1)
EOS%: 0.5 % (ref 0.0–7.0)
Eosinophils Absolute: 0 10*3/uL (ref 0.0–0.5)
HCT: 35.4 % (ref 34.8–46.6)
HGB: 11.8 g/dL (ref 11.6–15.9)
LYMPH%: 18.1 % (ref 14.0–49.7)
MCH: 31.1 pg (ref 25.1–34.0)
MCHC: 33.3 g/dL (ref 31.5–36.0)
MCV: 93.4 fL (ref 79.5–101.0)
MONO#: 0.4 10*3/uL (ref 0.1–0.9)
MONO%: 5.5 % (ref 0.0–14.0)
NEUT#: 6 10*3/uL (ref 1.5–6.5)
NEUT%: 75.9 % (ref 38.4–76.8)
Platelets: 55 10*3/uL — ABNORMAL LOW (ref 145–400)
RBC: 3.79 10*6/uL (ref 3.70–5.45)
RDW: 15.1 % — ABNORMAL HIGH (ref 11.2–14.5)
WBC: 7.9 10*3/uL (ref 3.9–10.3)
lymph#: 1.4 10*3/uL (ref 0.9–3.3)

## 2014-11-09 NOTE — Telephone Encounter (Signed)
-----   Message from Artis Delay, MD sent at 11/09/2014  8:39 AM EDT ----- Regarding: cbc PLs recommend increasing prednisone back to 60 mg ----- Message -----    From: [facility]    Sent: 11/09/2014   8:36 AM      To: Artis Delay, MD

## 2014-11-09 NOTE — Telephone Encounter (Signed)
S/w pt in lobby, Informed pt of lab results, gave her copy of CBC and instructed to increase prednisone back to 60 mg daily.  Keep lab appt next week as scheduled.  Call us for any unusual bleeding.  She verbalized understanding.

## 2014-11-11 ENCOUNTER — Other Ambulatory Visit: Payer: Self-pay | Admitting: Hematology and Oncology

## 2014-11-14 ENCOUNTER — Encounter: Payer: Medicaid Other | Admitting: Obstetrics

## 2014-11-16 ENCOUNTER — Other Ambulatory Visit (HOSPITAL_BASED_OUTPATIENT_CLINIC_OR_DEPARTMENT_OTHER): Payer: Medicaid Other

## 2014-11-16 ENCOUNTER — Telehealth: Payer: Self-pay | Admitting: *Deleted

## 2014-11-16 DIAGNOSIS — O99013 Anemia complicating pregnancy, third trimester: Secondary | ICD-10-CM

## 2014-11-16 DIAGNOSIS — D696 Thrombocytopenia, unspecified: Secondary | ICD-10-CM

## 2014-11-16 DIAGNOSIS — O99113 Other diseases of the blood and blood-forming organs and certain disorders involving the immune mechanism complicating pregnancy, third trimester: Secondary | ICD-10-CM

## 2014-11-16 LAB — CBC WITH DIFFERENTIAL/PLATELET
BASO%: 0.1 % (ref 0.0–2.0)
Basophils Absolute: 0 10*3/uL (ref 0.0–0.1)
EOS%: 0.5 % (ref 0.0–7.0)
Eosinophils Absolute: 0 10*3/uL (ref 0.0–0.5)
HCT: 34.5 % — ABNORMAL LOW (ref 34.8–46.6)
HGB: 11.4 g/dL — ABNORMAL LOW (ref 11.6–15.9)
LYMPH%: 25.3 % (ref 14.0–49.7)
MCH: 31 pg (ref 25.1–34.0)
MCHC: 33.1 g/dL (ref 31.5–36.0)
MCV: 93.6 fL (ref 79.5–101.0)
MONO#: 0.5 10*3/uL (ref 0.1–0.9)
MONO%: 8.3 % (ref 0.0–14.0)
NEUT#: 4.2 10*3/uL (ref 1.5–6.5)
NEUT%: 65.8 % (ref 38.4–76.8)
Platelets: 71 10*3/uL — ABNORMAL LOW (ref 145–400)
RBC: 3.69 10*6/uL — ABNORMAL LOW (ref 3.70–5.45)
RDW: 14.8 % — ABNORMAL HIGH (ref 11.2–14.5)
WBC: 6.4 10*3/uL (ref 3.9–10.3)
lymph#: 1.6 10*3/uL (ref 0.9–3.3)

## 2014-11-16 NOTE — Telephone Encounter (Signed)
-----   Message from Artis Delay, MD sent at 11/16/2014  8:38 AM EDT ----- Regarding: cbc Continue same dose prednisone ----- Message -----    From: [facility]    Sent: 11/16/2014   8:35 AM      To: Artis Delay, MD

## 2014-11-16 NOTE — Telephone Encounter (Signed)
Instructed pt to continue same dose prednisone 60 mg daily and keep lab appt next week as scheduled. She verbalized understanding.

## 2014-11-21 ENCOUNTER — Encounter: Payer: Self-pay | Admitting: Obstetrics

## 2014-11-21 ENCOUNTER — Ambulatory Visit (INDEPENDENT_AMBULATORY_CARE_PROVIDER_SITE_OTHER): Payer: Medicaid Other | Admitting: Obstetrics

## 2014-11-21 VITALS — BP 133/73 | HR 90 | Temp 97.9°F | Wt 139.0 lb

## 2014-11-21 DIAGNOSIS — A609 Anogenital herpesviral infection, unspecified: Secondary | ICD-10-CM

## 2014-11-21 DIAGNOSIS — A6009 Herpesviral infection of other urogenital tract: Secondary | ICD-10-CM

## 2014-11-21 DIAGNOSIS — O98519 Other viral diseases complicating pregnancy, unspecified trimester: Secondary | ICD-10-CM

## 2014-11-21 DIAGNOSIS — D696 Thrombocytopenia, unspecified: Secondary | ICD-10-CM

## 2014-11-21 DIAGNOSIS — Z3483 Encounter for supervision of other normal pregnancy, third trimester: Secondary | ICD-10-CM

## 2014-11-21 DIAGNOSIS — K219 Gastro-esophageal reflux disease without esophagitis: Secondary | ICD-10-CM

## 2014-11-21 DIAGNOSIS — O98313 Other infections with a predominantly sexual mode of transmission complicating pregnancy, third trimester: Secondary | ICD-10-CM

## 2014-11-21 DIAGNOSIS — O99113 Other diseases of the blood and blood-forming organs and certain disorders involving the immune mechanism complicating pregnancy, third trimester: Secondary | ICD-10-CM

## 2014-11-21 LAB — POCT URINALYSIS DIPSTICK
Bilirubin, UA: NEGATIVE
Blood, UA: NEGATIVE
Glucose, UA: 50
Ketones, UA: NEGATIVE
Leukocytes, UA: NEGATIVE
Nitrite, UA: NEGATIVE
Protein, UA: NEGATIVE
Spec Grav, UA: 1.015
Urobilinogen, UA: NEGATIVE
pH, UA: 7

## 2014-11-21 NOTE — Addendum Note (Signed)
Addended by: Coral Ceo A on: 11/21/2014 11:31 AM   Modules accepted: Orders

## 2014-11-21 NOTE — Progress Notes (Signed)
Subjective:    Emily Morse is a 29 y.o. female being seen today for her obstetrical visit. She is at [redacted]w[redacted]d gestation. Patient reports no complaints. Fetal movement: normal.  Problem List Items Addressed This Visit    None    Visit Diagnoses    Encounter for supervision of other normal pregnancy in third trimester    -  Primary    Relevant Orders    POCT urinalysis dipstick (Completed)      Patient Active Problem List   Diagnosis Date Noted  . Gestational thrombocytopenia without hemorrhage in third trimester   . SGA (small for gestational age)   . [redacted] weeks gestation of pregnancy   . Anemia affecting pregnancy in third trimester, antepartum 10/12/2014  . Hereditary disease in family possibly affecting fetus, affecting management of mother, antepartum condition or complication   . Encounter for fetal anatomic survey   . [redacted] weeks gestation of pregnancy   . Thrombocytopenia 02/18/2014  . Pain aggravated by activities of daily living 02/18/2014  . Anemia, postpartum 12/09/2013  . Active labor 12/07/2013  . S/P cesarean section 12/07/2013  . Thrombocytopenia complicating pregnancy 11/18/2013  . Unspecified high-risk pregnancy 08/04/2013  . H/O thrombocytopenia 08/04/2013  . GERD without esophagitis 08/04/2013   Objective:    BP 133/73 mmHg  Pulse 90  Temp(Src) 97.9 F (36.6 C)  Wt 139 lb (63.05 kg)  LMP 03/12/2014 FHT:  150 BPM  Uterine Size: size equals dates  Presentation: unsure     Assessment:    Pregnancy @ [redacted]w[redacted]d weeks   Plan:     labs reviewed, problem list updated Consent signed. GBS sent TDAP offered  Rhogam given for RH negative Pediatrician: discussed. Infant feeding: plans to breastfeed. Maternity leave: discussed. Cigarette smoking: former smoker. Orders Placed This Encounter  Procedures  . POCT urinalysis dipstick   No orders of the defined types were placed in this encounter.   Follow up in 1 Week.

## 2014-11-22 ENCOUNTER — Telehealth: Payer: Self-pay | Admitting: *Deleted

## 2014-11-22 MED ORDER — VALACYCLOVIR HCL 1 G PO TABS: ORAL_TABLET | ORAL | Status: DC

## 2014-11-22 MED ORDER — OMEPRAZOLE 20 MG PO CPDR: 20.0000 mg | DELAYED_RELEASE_CAPSULE | Freq: Two times a day (BID) | ORAL | Status: DC

## 2014-11-22 NOTE — Telephone Encounter (Signed)
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 Patient is currently having an outbreak and is requesting a prescription. Patient states she is also having heartburn and is wanting to know if she can having something other than Prilosec for it.

## 2014-11-22 NOTE — Addendum Note (Signed)
Addended by: Coral Ceo A on: 11/22/2014 04:45 PM   Modules accepted: Orders

## 2014-11-23 ENCOUNTER — Ambulatory Visit (HOSPITAL_BASED_OUTPATIENT_CLINIC_OR_DEPARTMENT_OTHER): Payer: Medicaid Other | Admitting: Hematology and Oncology

## 2014-11-23 ENCOUNTER — Other Ambulatory Visit (HOSPITAL_BASED_OUTPATIENT_CLINIC_OR_DEPARTMENT_OTHER): Payer: Medicaid Other

## 2014-11-23 ENCOUNTER — Other Ambulatory Visit: Payer: Self-pay | Admitting: Hematology and Oncology

## 2014-11-23 ENCOUNTER — Encounter: Payer: Self-pay | Admitting: Hematology and Oncology

## 2014-11-23 VITALS — BP 110/64 | HR 85 | Temp 98.2°F | Resp 18 | Ht 64.0 in | Wt 139.4 lb

## 2014-11-23 DIAGNOSIS — O99013 Anemia complicating pregnancy, third trimester: Secondary | ICD-10-CM | POA: Diagnosis not present

## 2014-11-23 DIAGNOSIS — D696 Thrombocytopenia, unspecified: Secondary | ICD-10-CM

## 2014-11-23 DIAGNOSIS — O99113 Other diseases of the blood and blood-forming organs and certain disorders involving the immune mechanism complicating pregnancy, third trimester: Secondary | ICD-10-CM

## 2014-11-23 LAB — CBC WITH DIFFERENTIAL/PLATELET
BASO%: 0 % (ref 0.0–2.0)
Basophils Absolute: 0 10*3/uL (ref 0.0–0.1)
EOS%: 0.4 % (ref 0.0–7.0)
Eosinophils Absolute: 0 10*3/uL (ref 0.0–0.5)
HCT: 32.7 % — ABNORMAL LOW (ref 34.8–46.6)
HGB: 11 g/dL — ABNORMAL LOW (ref 11.6–15.9)
LYMPH%: 18.8 % (ref 14.0–49.7)
MCH: 31.3 pg (ref 25.1–34.0)
MCHC: 33.6 g/dL (ref 31.5–36.0)
MCV: 93.2 fL (ref 79.5–101.0)
MONO#: 0.5 10*3/uL (ref 0.1–0.9)
MONO%: 7.7 % (ref 0.0–14.0)
NEUT#: 5.1 10*3/uL (ref 1.5–6.5)
NEUT%: 73.1 % (ref 38.4–76.8)
Platelets: 66 10*3/uL — ABNORMAL LOW (ref 145–400)
RBC: 3.51 10*6/uL — ABNORMAL LOW (ref 3.70–5.45)
RDW: 14.8 % — ABNORMAL HIGH (ref 11.2–14.5)
WBC: 7 10*3/uL (ref 3.9–10.3)
lymph#: 1.3 10*3/uL (ref 0.9–3.3)

## 2014-11-23 LAB — STREP B DNA PROBE: GBSP: NOT DETECTED

## 2014-11-23 NOTE — Assessment & Plan Note (Signed)
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 The patient denies recent history of bleeding such as epistaxis, hematuria or hematochezia. She is asymptomatic from the anemia. We will observe for now.   iron studies come from iron deficiency and she is doing well with oral iron supplement along with prenatal vitamin.

## 2014-11-23 NOTE — Assessment & Plan Note (Signed)
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 Previously, with the first pregnancy, she responded well to prednisone, bringing her platelet count close to 100,000. I think it is reasonable to try prednisone again but unfortunately her past response is only in the region of 55,000 to 70,000. I recommend she reduce her prednisone to about 40 mg With the suboptimal response to prednisone, I doubt IVIG would be helpful. I spoke with her OB/GYN and discussed this case over the phone.  The patient will have a planned C-section and delivery at 39 weeks pregnancy. I doubt she would get epidural anesthesia with low platelet count. It would not be problem if she goes for general anesthesia is platelet count is > 50,000. If epidural is still desired, I recommend platelet transfusion prior to epidural catheter insertion if her platelet count remained very low  I plan to see her back 6 weeks after delivery to follow-up on postpartum platelet count. She will continue prednisone therapy up until after delivery before she can stop taking prednisone

## 2014-11-23 NOTE — Progress Notes (Signed)
[facility] OFFICE PROGRESS NOTE  Emily Morse,Emily A, MD SUMMARY OF HEMATOLOGIC HISTORY:  She was found to have abnormal CBC from prenatal visits. The patient have prior thrombocytopenia with 2 of her prior pregnancies. I do not have Morse baseline platelet counts. 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 Her last platelet count on 09/26/2014 was low at 63,000. With her first pregnancy, her daughter was deliver on 10/22/2011 with epidural and C-section. She did receive prednisone prior to delivery and she responded to the treatment. With her second pregnancy, her son was born on 12/07/2013. She never received corticosteroids therapy and underwent Morse C-section under general anesthesia. With her current pregnancy, she is in the third trimester, with expected due date/planned C-section delivery on 12/17/2014 She is also noted to be anemic. She denies recent bruising/bleeding, such as spontaneous epistaxis, hematuria, melena or hematochezia She denies recent excessive menorrhagia The patient denies history of liver disease, exposure to heparin, history of cardiac murmur/prior cardiovascular surgery or recent new medications She denies prior blood or platelet transfusions She denies symptoms of fatigue. On 10/12/2014, she was started on prednisone INTERVAL HISTORY: Emily Morse 29 y.o. female returns for further follow-up. She is doing well with prednisone therapy. She denies side effects from prednisone. The patient denies any recent signs or symptoms of bleeding such as spontaneous epistaxis, hematuria or hematochezia.  I have reviewed the past medical history, past surgical history, social history and family history with the patient and they are unchanged from previous note.  ALLERGIES:  has No Known Allergies.  MEDICATIONS:  Current Outpatient Prescriptions  Medication Sig Dispense Refill  . Iron-FA-B Cmp-C-Biot-Probiotic (FUSION  PLUS) CAPS Take 1 capsule by mouth daily before breakfast. 30 capsule 11  . omeprazole (PRILOSEC) 20 MG capsule Take 1 capsule (20 mg total) by mouth 2 (two) times daily before Morse meal. 60 capsule 5  . predniSONE (DELTASONE) 20 MG tablet Take 3 tablets (60 mg total) by mouth daily with breakfast. 90 tablet 3  . Prenat-FeCbn-FeAspGl-FA-Omega (OB COMPLETE PETITE) 35-5-1-200 MG CAPS Take 1 capsule by mouth daily. 30 capsule 11  . valACYclovir (VALTREX) 1000 MG tablet 1 gram po bid for 10 days,  then 1 gram po daily for suppression. 30 tablet prn  . zolpidem (AMBIEN) 10 MG tablet Take 1 tablet (10 mg total) by mouth at bedtime as needed for sleep. 30 tablet 3   No current facility-administered medications for this visit.     REVIEW OF SYSTEMS:   Constitutional: Denies fevers, chills or night sweats Eyes: Denies blurriness of vision Ears, nose, mouth, throat, and face: Denies mucositis or sore throat Respiratory: Denies cough, dyspnea or wheezes Cardiovascular: Denies palpitation, chest discomfort or lower extremity swelling Gastrointestinal:  Denies nausea, heartburn or change in bowel habits Skin: Denies abnormal skin rashes Lymphatics: Denies new lymphadenopathy or easy bruising Neurological:Denies numbness, tingling or new weaknesses Behavioral/Psych: Mood is stable, no new changes  All other systems were reviewed with the patient and are negative.  PHYSICAL EXAMINATION: ECOG PERFORMANCE STATUS: 0 - Asymptomatic  Filed Vitals:   11/23/14 0904  BP: 110/64  Pulse: 85  Temp: 98.2 F (36.8 C)  Resp: 18   Filed Weights   11/23/14 0904  Weight: 139 lb 6.4 oz (63.231 kg)    GENERAL:alert, no distress and comfortable SKIN: skin color, texture, turgor are normal, no rashes or significant lesions EYES: normal, Conjunctiva are pink and non-injected, sclera clear OROPHARYNX:no exudate, no  erythema and lips, buccal mucosa, and tongue normal  NECK: supple, thyroid normal size, non-tender,  without nodularity LYMPH:  no palpable lymphadenopathy in the cervical, axillary or inguinal LUNGS: clear to auscultation and percussion with normal breathing effort HEART: regular rate & rhythm and no murmurs and no lower extremity edema ABDOMEN:abdomen soft, non-tender and normal bowel sounds Musculoskeletal:no cyanosis of digits and no clubbing  NEURO: alert & oriented x 3 with fluent speech, no focal motor/sensory deficits  LABORATORY DATA:  I have reviewed the data as listed Results for orders placed or performed in visit on 11/23/14 (from the past 48 hour(s))  CBC with Differential/Platelet     Status: Abnormal   Collection Time: 11/23/14  8:12 AM  Result Value Ref Range   WBC 7.0 3.9 - 10.3 10e3/uL   NEUT# 5.1 1.5 - 6.5 10e3/uL   HGB 11.0 (L) 11.6 - 15.9 g/dL   HCT 16.1 (L) 09.6 - 04.5 %   Platelets 66 (L) 145 - 400 10e3/uL   MCV 93.2 79.5 - 101.0 fL   MCH 31.3 25.1 - 34.0 pg   MCHC 33.6 31.5 - 36.0 g/dL   RBC 4.09 (L) 8.11 - 9.14 10e6/uL   RDW 14.8 (H) 11.2 - 14.5 %   lymph# 1.3 0.9 - 3.3 10e3/uL   MONO# 0.5 0.1 - 0.9 10e3/uL   Eosinophils Absolute 0.0 0.0 - 0.5 10e3/uL   Basophils Absolute 0.0 0.0 - 0.1 10e3/uL   NEUT% 73.1 38.4 - 76.8 %   LYMPH% 18.8 14.0 - 49.7 %   MONO% 7.7 0.0 - 14.0 %   EOS% 0.4 0.0 - 7.0 %   BASO% 0.0 0.0 - 2.0 %    Lab Results  Component Value Date   WBC 7.0 11/23/2014   HGB 11.0* 11/23/2014   HCT 32.7* 11/23/2014   MCV 93.2 11/23/2014   PLT 66* 11/23/2014    RADIOGRAPHIC STUDIES: I have personally reviewed the radiological images as listed and agreed with the findings in the report. No results found.  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 Previously, with the first pregnancy, she responded well to prednisone, bringing her platelet count close to 100,000. I think it is reasonable to try prednisone again but  unfortunately her past response is only in the region of 55,000 to 70,000. I recommend she reduce her prednisone to about 40 mg With the suboptimal response to prednisone, I doubt IVIG would be helpful. I spoke with her OB/GYN and discussed this case over the phone.  The patient will have Morse planned C-section and delivery at 39 weeks pregnancy. I doubt she would get epidural anesthesia with low platelet count. It would not be problem if she goes for general anesthesia is platelet count is > 50,000. If epidural is still desired, I recommend platelet transfusion prior to epidural catheter insertion if her platelet count remained very low  I plan to see her back 6 weeks after delivery to follow-up on postpartum platelet count. She will continue prednisone therapy up until after delivery before she can stop taking prednisone   Anemia affecting pregnancy in third trimester, antepartum This is likely anemia of chronic disease related to pregnancy. The patient denies recent history of bleeding such as epistaxis, hematuria or hematochezia. She is asymptomatic from the anemia. We will observe for now.   iron studies come from iron deficiency and she is doing well  with oral iron supplement along with prenatal vitamin.     All questions were answered. The patient knows to call the clinic with any problems, questions or concerns. No barriers to learning was detected.  I spent 15 minutes counseling the patient face to face. The total time spent in the appointment was 20 minutes and more than 50% was on counseling.     [facility], Cathalina Barcia, MD 8/17/20162:42 PM

## 2014-11-24 ENCOUNTER — Telehealth: Payer: Self-pay | Admitting: Hematology and Oncology

## 2014-11-24 NOTE — Telephone Encounter (Signed)
Appointments made and placed a note for patient to get a new schedule at 8/24 scheduled lab and patient has mychart as well  anne

## 2014-11-28 ENCOUNTER — Encounter: Payer: Medicaid Other | Admitting: Obstetrics

## 2014-11-30 ENCOUNTER — Other Ambulatory Visit (HOSPITAL_BASED_OUTPATIENT_CLINIC_OR_DEPARTMENT_OTHER): Payer: Medicaid Other

## 2014-11-30 ENCOUNTER — Other Ambulatory Visit: Payer: Self-pay | Admitting: *Deleted

## 2014-11-30 DIAGNOSIS — O99113 Other diseases of the blood and blood-forming organs and certain disorders involving the immune mechanism complicating pregnancy, third trimester: Secondary | ICD-10-CM | POA: Diagnosis not present

## 2014-11-30 DIAGNOSIS — O99013 Anemia complicating pregnancy, third trimester: Secondary | ICD-10-CM | POA: Diagnosis not present

## 2014-11-30 DIAGNOSIS — D696 Thrombocytopenia, unspecified: Secondary | ICD-10-CM

## 2014-11-30 LAB — CBC WITH DIFFERENTIAL/PLATELET
BASO%: 0 % (ref 0.0–2.0)
Basophils Absolute: 0 10*3/uL (ref 0.0–0.1)
EOS%: 0.1 % (ref 0.0–7.0)
Eosinophils Absolute: 0 10*3/uL (ref 0.0–0.5)
HCT: 33 % — ABNORMAL LOW (ref 34.8–46.6)
HGB: 11.2 g/dL — ABNORMAL LOW (ref 11.6–15.9)
LYMPH%: 18.6 % (ref 14.0–49.7)
MCH: 31.5 pg (ref 25.1–34.0)
MCHC: 33.9 g/dL (ref 31.5–36.0)
MCV: 93 fL (ref 79.5–101.0)
MONO#: 0.6 10*3/uL (ref 0.1–0.9)
MONO%: 7.1 % (ref 0.0–14.0)
NEUT#: 5.8 10*3/uL (ref 1.5–6.5)
NEUT%: 74.2 % (ref 38.4–76.8)
Platelets: 58 10*3/uL — ABNORMAL LOW (ref 145–400)
RBC: 3.55 10*6/uL — ABNORMAL LOW (ref 3.70–5.45)
RDW: 14.9 % — ABNORMAL HIGH (ref 11.2–14.5)
WBC: 7.8 10*3/uL (ref 3.9–10.3)
lymph#: 1.4 10*3/uL (ref 0.9–3.3)

## 2014-11-30 NOTE — Progress Notes (Signed)
Discussed lab results with patient, she is to continue same dose of prednisone, per dr Bertis Ruddy. pof to schedulers for appt 12/07/14. Lab only

## 2014-12-02 ENCOUNTER — Ambulatory Visit (INDEPENDENT_AMBULATORY_CARE_PROVIDER_SITE_OTHER): Payer: Medicaid Other | Admitting: Obstetrics

## 2014-12-02 ENCOUNTER — Encounter: Payer: Self-pay | Admitting: Obstetrics

## 2014-12-02 VITALS — BP 132/83 | HR 74 | Temp 98.5°F | Wt 148.0 lb

## 2014-12-02 DIAGNOSIS — Z3483 Encounter for supervision of other normal pregnancy, third trimester: Secondary | ICD-10-CM

## 2014-12-02 LAB — POCT URINALYSIS DIPSTICK
Bilirubin, UA: NEGATIVE
Blood, UA: NEGATIVE
Glucose, UA: NORMAL
Ketones, UA: NEGATIVE
Leukocytes, UA: NEGATIVE
Nitrite, UA: NEGATIVE
Protein, UA: NEGATIVE
Spec Grav, UA: 1.015
Urobilinogen, UA: 1
pH, UA: 7

## 2014-12-02 NOTE — Progress Notes (Signed)
Subjective:    Emily Morse is a 29 y.o. female being seen today for her obstetrical visit. She is at [redacted]w[redacted]d gestation. Patient reports no complaints. Fetal movement: normal.  Problem List Items Addressed This Visit    None    Visit Diagnoses    Encounter for supervision of other normal pregnancy in third trimester    -  Primary    Relevant Orders    POCT urinalysis dipstick (Completed)      Patient Active Problem List   Diagnosis Date Noted  . Gestational thrombocytopenia without hemorrhage in third trimester   . SGA (small for gestational age)   . [redacted] weeks gestation of pregnancy   . Anemia affecting pregnancy in third trimester, antepartum 10/12/2014  . Hereditary disease in family possibly affecting fetus, affecting management of mother, antepartum condition or complication   . Encounter for fetal anatomic survey   . [redacted] weeks gestation of pregnancy   . Thrombocytopenia 02/18/2014  . Pain aggravated by activities of daily living 02/18/2014  . Anemia, postpartum 12/09/2013  . Active labor 12/07/2013  . S/P cesarean section 12/07/2013  . Thrombocytopenia complicating pregnancy 11/18/2013  . Unspecified high-risk pregnancy 08/04/2013  . H/O thrombocytopenia 08/04/2013  . GERD without esophagitis 08/04/2013    Objective:    BP 132/83 mmHg  Pulse 74  Temp(Src) 98.5 F (36.9 C)  Wt 148 lb (67.132 kg)  LMP 03/12/2014 FHT: 150 BPM  Uterine Size: size equals dates  Presentations: unsure    Assessment:    Pregnancy @ [redacted]w[redacted]d weeks   Plan:   Plans for delivery: C/Section scheduled; labs reviewed; problem list updated Counseling: Consent signed. Infant feeding: plans to breastfeed. Cigarette smoking: former smoker. L&D discussion: symptoms of labor, discussed when to call, discussed what number to call, anesthetic/analgesic options reviewed and delivering clinician:  plans Physician. Postpartum supports and preparation: circumcision discussed and contraception plans  discussed.  Follow up in 1 Week.

## 2014-12-05 ENCOUNTER — Encounter (HOSPITAL_COMMUNITY): Payer: Self-pay | Admitting: *Deleted

## 2014-12-05 ENCOUNTER — Encounter (HOSPITAL_COMMUNITY)
Admission: AD | Disposition: A | Discharge status: Home / Self Care | Payer: Self-pay | Source: Ambulatory Visit | Attending: Obstetrics

## 2014-12-05 ENCOUNTER — Inpatient Hospital Stay (HOSPITAL_COMMUNITY)
Admission: AD | Admit: 2014-12-05 | Discharge: 2014-12-07 | DRG: 765 | Disposition: A | Discharge status: Home / Self Care | Payer: Medicaid Other | Source: Ambulatory Visit | Attending: Obstetrics | Admitting: Obstetrics

## 2014-12-05 ENCOUNTER — Inpatient Hospital Stay (HOSPITAL_COMMUNITY): Payer: Medicaid Other | Admitting: Certified Registered Nurse Anesthetist

## 2014-12-05 DIAGNOSIS — Z98891 History of uterine scar from previous surgery: Secondary | ICD-10-CM

## 2014-12-05 DIAGNOSIS — O3421 Maternal care for scar from previous cesarean delivery: Secondary | ICD-10-CM | POA: Diagnosis present

## 2014-12-05 DIAGNOSIS — O9912 Other diseases of the blood and blood-forming organs and certain disorders involving the immune mechanism complicating childbirth: Secondary | ICD-10-CM | POA: Diagnosis present

## 2014-12-05 DIAGNOSIS — Z302 Encounter for sterilization: Secondary | ICD-10-CM

## 2014-12-05 DIAGNOSIS — D6959 Other secondary thrombocytopenia: Secondary | ICD-10-CM | POA: Diagnosis present

## 2014-12-05 DIAGNOSIS — Z3A38 38 weeks gestation of pregnancy: Secondary | ICD-10-CM | POA: Diagnosis present

## 2014-12-05 DIAGNOSIS — Z87891 Personal history of nicotine dependence: Secondary | ICD-10-CM | POA: Diagnosis not present

## 2014-12-05 HISTORY — DX: Anemia, unspecified: D64.9

## 2014-12-05 HISTORY — DX: Personal history of other diseases of the digestive system: Z87.19

## 2014-12-05 LAB — PREPARE RBC (CROSSMATCH)

## 2014-12-05 LAB — CBC
HCT: 33.4 % — ABNORMAL LOW (ref 36.0–46.0)
Hemoglobin: 11.5 g/dL — ABNORMAL LOW (ref 12.0–15.0)
MCH: 32.3 pg (ref 26.0–34.0)
MCHC: 34.4 g/dL (ref 30.0–36.0)
MCV: 93.8 fL (ref 78.0–100.0)
Platelets: 71 10*3/uL — ABNORMAL LOW (ref 150–400)
RBC: 3.56 MIL/uL — ABNORMAL LOW (ref 3.87–5.11)
RDW: 15.8 % — ABNORMAL HIGH (ref 11.5–15.5)
WBC: 8.6 10*3/uL (ref 4.0–10.5)

## 2014-12-05 SURGERY — Surgical Case
Anesthesia: General | Site: Abdomen

## 2014-12-05 MED ORDER — LANOLIN HYDROUS EX OINT: 1.0000 "application " | TOPICAL_OINTMENT | CUTANEOUS | Status: DC | PRN

## 2014-12-05 MED ORDER — SIMETHICONE 80 MG PO CHEW
80.0000 mg | CHEWABLE_TABLET | ORAL | Status: DC
  Administered 2014-12-06 (×2): 80 mg via ORAL
  Filled 2014-12-05 (×2): qty 1

## 2014-12-05 MED ORDER — OXYTOCIN 40 UNITS IN LACTATED RINGERS INFUSION - SIMPLE MED
62.5000 mL/h | INTRAVENOUS | Status: AC
Start: 2014-12-05 — End: 2014-12-06

## 2014-12-05 MED ORDER — SUCCINYLCHOLINE CHLORIDE 20 MG/ML IJ SOLN
INTRAMUSCULAR | Status: DC | PRN
  Administered 2014-12-05: 180 mg via INTRAVENOUS

## 2014-12-05 MED ORDER — SUCCINYLCHOLINE CHLORIDE 20 MG/ML IJ SOLN
INTRAMUSCULAR | Status: AC
  Filled 2014-12-05: qty 1

## 2014-12-05 MED ORDER — OXYCODONE-ACETAMINOPHEN 5-325 MG PO TABS
1.0000 | ORAL_TABLET | ORAL | Status: DC | PRN
  Administered 2014-12-06: 1 via ORAL
  Filled 2014-12-05: qty 1

## 2014-12-05 MED ORDER — MIDAZOLAM HCL 2 MG/2ML IJ SOLN
INTRAMUSCULAR | Status: AC
  Filled 2014-12-05: qty 4

## 2014-12-05 MED ORDER — SODIUM CHLORIDE 0.9 % IJ SOLN: 9.0000 mL | INTRAMUSCULAR | Status: DC | PRN

## 2014-12-05 MED ORDER — SIMETHICONE 80 MG PO CHEW
80.0000 mg | CHEWABLE_TABLET | Freq: Three times a day (TID) | ORAL | Status: DC
  Administered 2014-12-06 – 2014-12-07 (×4): 80 mg via ORAL
  Filled 2014-12-05 (×4): qty 1

## 2014-12-05 MED ORDER — SODIUM CHLORIDE 0.9 % IJ SOLN
INTRAMUSCULAR | Status: AC
  Filled 2014-12-05: qty 10

## 2014-12-05 MED ORDER — LACTATED RINGERS IV SOLN
INTRAVENOUS | Status: DC | PRN
  Administered 2014-12-05 (×2): via INTRAVENOUS

## 2014-12-05 MED ORDER — DEXAMETHASONE SODIUM PHOSPHATE 10 MG/ML IJ SOLN
INTRAMUSCULAR | Status: DC | PRN
  Administered 2014-12-05: 4 mg via INTRAVENOUS

## 2014-12-05 MED ORDER — MIDAZOLAM HCL 2 MG/2ML IJ SOLN
INTRAMUSCULAR | Status: DC | PRN
  Administered 2014-12-05: 2 mg via INTRAVENOUS

## 2014-12-05 MED ORDER — ONDANSETRON HCL 4 MG/2ML IJ SOLN
INTRAMUSCULAR | Status: AC
  Filled 2014-12-05: qty 2

## 2014-12-05 MED ORDER — HYDROMORPHONE HCL 1 MG/ML IJ SOLN
INTRAMUSCULAR | Status: AC
  Filled 2014-12-05: qty 1

## 2014-12-05 MED ORDER — VALACYCLOVIR HCL 500 MG PO TABS
1000.0000 mg | ORAL_TABLET | Freq: Every day | ORAL | Status: DC
  Administered 2014-12-06 – 2014-12-07 (×2): 1000 mg via ORAL
  Filled 2014-12-05 (×2): qty 2

## 2014-12-05 MED ORDER — WITCH HAZEL-GLYCERIN EX PADS: 1.0000 "application " | MEDICATED_PAD | CUTANEOUS | Status: DC | PRN

## 2014-12-05 MED ORDER — GLYCOPYRROLATE 0.2 MG/ML IJ SOLN
INTRAMUSCULAR | Status: DC | PRN
  Administered 2014-12-05: 0.1 mg via INTRAVENOUS

## 2014-12-05 MED ORDER — HYDROMORPHONE HCL 1 MG/ML IJ SOLN
0.2500 mg | INTRAMUSCULAR | Status: DC | PRN
  Administered 2014-12-05 (×5): 0.5 mg via INTRAVENOUS

## 2014-12-05 MED ORDER — SCOPOLAMINE 1 MG/3DAYS TD PT72
MEDICATED_PATCH | TRANSDERMAL | Status: AC
  Filled 2014-12-05: qty 1

## 2014-12-05 MED ORDER — PREDNISONE 50 MG PO TABS
60.0000 mg | ORAL_TABLET | Freq: Every day | ORAL | Status: DC
  Administered 2014-12-06: 60 mg via ORAL
  Filled 2014-12-05: qty 1

## 2014-12-05 MED ORDER — GLYCOPYRROLATE 0.2 MG/ML IJ SOLN
INTRAMUSCULAR | Status: AC
  Filled 2014-12-05: qty 1

## 2014-12-05 MED ORDER — FAMOTIDINE IN NACL 20-0.9 MG/50ML-% IV SOLN
20.0000 mg | Freq: Once | INTRAVENOUS | Status: AC
  Administered 2014-12-05: 20 mg via INTRAVENOUS
  Filled 2014-12-05: qty 50

## 2014-12-05 MED ORDER — 0.9 % SODIUM CHLORIDE (POUR BTL) OPTIME
TOPICAL | Status: DC | PRN
  Administered 2014-12-05: 1000 mL

## 2014-12-05 MED ORDER — LACTATED RINGERS IV SOLN
INTRAVENOUS | Status: DC | PRN
  Administered 2014-12-05: 17:00:00 via INTRAVENOUS

## 2014-12-05 MED ORDER — SCOPOLAMINE 1 MG/3DAYS TD PT72
MEDICATED_PATCH | TRANSDERMAL | Status: DC | PRN
  Administered 2014-12-05: 1 via TRANSDERMAL

## 2014-12-05 MED ORDER — PRENATAL MULTIVITAMIN CH
1.0000 | ORAL_TABLET | Freq: Every day | ORAL | Status: DC
  Administered 2014-12-06: 1 via ORAL
  Filled 2014-12-05: qty 1

## 2014-12-05 MED ORDER — FENTANYL CITRATE (PF) 100 MCG/2ML IJ SOLN
INTRAMUSCULAR | Status: AC
Start: 2014-12-05 — End: 2014-12-05
  Filled 2014-12-05: qty 4

## 2014-12-05 MED ORDER — HYDROMORPHONE 0.3 MG/ML IV SOLN
INTRAVENOUS | Status: DC
  Administered 2014-12-05: 20:00:00 via INTRAVENOUS
  Administered 2014-12-06: 3.2 mg via INTRAVENOUS
  Administered 2014-12-06: 0.9 mg via INTRAVENOUS
  Filled 2014-12-05: qty 25

## 2014-12-05 MED ORDER — OXYTOCIN 10 UNIT/ML IJ SOLN
INTRAMUSCULAR | Status: AC
  Filled 2014-12-05: qty 4

## 2014-12-05 MED ORDER — FENTANYL CITRATE (PF) 100 MCG/2ML IJ SOLN
INTRAMUSCULAR | Status: DC | PRN
  Administered 2014-12-05: 250 ug via INTRAVENOUS
  Administered 2014-12-05: 100 ug via INTRAVENOUS

## 2014-12-05 MED ORDER — LIDOCAINE HCL (CARDIAC) 20 MG/ML IV SOLN
INTRAVENOUS | Status: AC
  Filled 2014-12-05: qty 5

## 2014-12-05 MED ORDER — SENNOSIDES-DOCUSATE SODIUM 8.6-50 MG PO TABS
2.0000 | ORAL_TABLET | ORAL | Status: DC
Start: 2014-12-06 — End: 2014-12-07
  Administered 2014-12-06 (×2): 2 via ORAL
  Filled 2014-12-05 (×2): qty 2

## 2014-12-05 MED ORDER — PROPOFOL 10 MG/ML IV BOLUS
INTRAVENOUS | Status: AC
  Filled 2014-12-05: qty 20

## 2014-12-05 MED ORDER — DIPHENHYDRAMINE HCL 50 MG/ML IJ SOLN: 12.5000 mg | Freq: Four times a day (QID) | INTRAMUSCULAR | Status: DC | PRN

## 2014-12-05 MED ORDER — IBUPROFEN 600 MG PO TABS: 600.0000 mg | ORAL_TABLET | Freq: Four times a day (QID) | ORAL | Status: DC

## 2014-12-05 MED ORDER — DIPHENHYDRAMINE HCL 25 MG PO CAPS: 25.0000 mg | ORAL_CAPSULE | Freq: Four times a day (QID) | ORAL | Status: DC | PRN

## 2014-12-05 MED ORDER — OXYTOCIN 10 UNIT/ML IJ SOLN
40.0000 [IU] | INTRAVENOUS | Status: DC | PRN
  Administered 2014-12-05: 40 [IU] via INTRAVENOUS

## 2014-12-05 MED ORDER — ROCURONIUM BROMIDE 100 MG/10ML IV SOLN
INTRAVENOUS | Status: AC
  Filled 2014-12-05: qty 1

## 2014-12-05 MED ORDER — LACTATED RINGERS IV SOLN
INTRAVENOUS | Status: DC
Start: 2014-12-05 — End: 2014-12-07
  Administered 2014-12-06: 01:00:00 via INTRAVENOUS

## 2014-12-05 MED ORDER — TETANUS-DIPHTH-ACELL PERTUSSIS 5-2.5-18.5 LF-MCG/0.5 IM SUSP: 0.5000 mL | Freq: Once | INTRAMUSCULAR | Status: DC

## 2014-12-05 MED ORDER — NALOXONE HCL 0.4 MG/ML IJ SOLN: 0.4000 mg | INTRAMUSCULAR | Status: DC | PRN

## 2014-12-05 MED ORDER — METOCLOPRAMIDE HCL 5 MG/ML IJ SOLN
20.0000 mg | Freq: Once | INTRAMUSCULAR | Status: AC
  Administered 2014-12-05: 20 mg via INTRAVENOUS
  Filled 2014-12-05: qty 4

## 2014-12-05 MED ORDER — ONDANSETRON HCL 4 MG/2ML IJ SOLN
4.0000 mg | Freq: Four times a day (QID) | INTRAMUSCULAR | Status: DC | PRN
Start: 2014-12-05 — End: 2014-12-07

## 2014-12-05 MED ORDER — ONDANSETRON HCL 4 MG/2ML IJ SOLN
INTRAMUSCULAR | Status: DC | PRN
  Administered 2014-12-05: 4 mg via INTRAVENOUS

## 2014-12-05 MED ORDER — DIBUCAINE 1 % RE OINT: 1.0000 "application " | TOPICAL_OINTMENT | RECTAL | Status: DC | PRN

## 2014-12-05 MED ORDER — SIMETHICONE 80 MG PO CHEW: 80.0000 mg | CHEWABLE_TABLET | ORAL | Status: DC | PRN

## 2014-12-05 MED ORDER — ZOLPIDEM TARTRATE 5 MG PO TABS: 5.0000 mg | ORAL_TABLET | Freq: Every evening | ORAL | Status: DC | PRN

## 2014-12-05 MED ORDER — PROPOFOL 10 MG/ML IV BOLUS
INTRAVENOUS | Status: DC | PRN
  Administered 2014-12-05: 30 mg via INTRAVENOUS
  Administered 2014-12-05: 170 mg via INTRAVENOUS

## 2014-12-05 MED ORDER — OXYCODONE-ACETAMINOPHEN 5-325 MG PO TABS
2.0000 | ORAL_TABLET | ORAL | Status: DC | PRN
  Administered 2014-12-06 – 2014-12-07 (×6): 2 via ORAL
  Filled 2014-12-05 (×6): qty 2

## 2014-12-05 MED ORDER — LACTATED RINGERS IV BOLUS (SEPSIS)
1000.0000 mL | Freq: Once | INTRAVENOUS | Status: DC
Start: 2014-12-05 — End: 2014-12-05

## 2014-12-05 MED ORDER — CEFAZOLIN SODIUM-DEXTROSE 2-3 GM-% IV SOLR
INTRAVENOUS | Status: DC | PRN
  Administered 2014-12-05: 2 g via INTRAVENOUS

## 2014-12-05 MED ORDER — MENTHOL 3 MG MT LOZG: 1.0000 | LOZENGE | OROMUCOSAL | Status: DC | PRN

## 2014-12-05 MED ORDER — FENTANYL CITRATE (PF) 250 MCG/5ML IJ SOLN
INTRAMUSCULAR | Status: AC
  Filled 2014-12-05: qty 25

## 2014-12-05 MED ORDER — DEXAMETHASONE SODIUM PHOSPHATE 4 MG/ML IJ SOLN
INTRAMUSCULAR | Status: AC
  Filled 2014-12-05: qty 1

## 2014-12-05 MED ORDER — DIPHENHYDRAMINE HCL 12.5 MG/5ML PO ELIX
12.5000 mg | ORAL_SOLUTION | Freq: Four times a day (QID) | ORAL | Status: DC | PRN
  Filled 2014-12-05: qty 5

## 2014-12-05 MED ORDER — LACTATED RINGERS IV SOLN
INTRAVENOUS | Status: DC
  Administered 2014-12-05: 15:00:00 via INTRAVENOUS

## 2014-12-05 MED ORDER — ACETAMINOPHEN 325 MG PO TABS: 650.0000 mg | ORAL_TABLET | ORAL | Status: DC | PRN

## 2014-12-05 SURGICAL SUPPLY — 32 items
CLAMP CORD UMBIL (MISCELLANEOUS) ×1 IMPLANT
CLOTH BEACON ORANGE TIMEOUT ST (SAFETY) ×2 IMPLANT
CONTAINER PREFILL 10% NBF 15ML (MISCELLANEOUS) ×4 IMPLANT
DRAPE SHEET LG 3/4 BI-LAMINATE (DRAPES) ×1 IMPLANT
DRSG OPSITE POSTOP 4X10 (GAUZE/BANDAGES/DRESSINGS) ×2 IMPLANT
DURAPREP 26ML APPLICATOR (WOUND CARE) ×2 IMPLANT
ELECT REM PT RETURN 9FT ADLT (ELECTROSURGICAL) ×2
ELECTRODE REM PT RTRN 9FT ADLT (ELECTROSURGICAL) ×1 IMPLANT
EXTRACTOR VACUUM M CUP 4 TUBE (SUCTIONS) IMPLANT
GLOVE BIO SURGEON STRL SZ8 (GLOVE) ×2 IMPLANT
GOWN STRL REUS W/TWL LRG LVL3 (GOWN DISPOSABLE) ×4 IMPLANT
LIQUID BAND (GAUZE/BANDAGES/DRESSINGS) ×2 IMPLANT
NDL HYPO 25X5/8 SAFETYGLIDE (NEEDLE) ×1 IMPLANT
NEEDLE HYPO 22GX1.5 SAFETY (NEEDLE) ×2 IMPLANT
NEEDLE HYPO 25X5/8 SAFETYGLIDE (NEEDLE) ×2 IMPLANT
NS IRRIG 1000ML POUR BTL (IV SOLUTION) ×2 IMPLANT
PACK C SECTION WH (CUSTOM PROCEDURE TRAY) ×2 IMPLANT
PAD OB MATERNITY 4.3X12.25 (PERSONAL CARE ITEMS) ×2 IMPLANT
RTRCTR C-SECT PINK 25CM LRG (MISCELLANEOUS) ×2 IMPLANT
SUT GUT PLAIN 0 CT-3 TAN 27 (SUTURE) ×1 IMPLANT
SUT MNCRL 0 VIOLET CTX 36 (SUTURE) ×3 IMPLANT
SUT MNCRL AB 4-0 PS2 18 (SUTURE) IMPLANT
SUT MON AB 2-0 CT1 27 (SUTURE) ×2 IMPLANT
SUT MON AB 3-0 SH 27 (SUTURE)
SUT MON AB 3-0 SH27 (SUTURE) IMPLANT
SUT MONOCRYL 0 CTX 36 (SUTURE) ×3
SUT PLAIN 2 0 XLH (SUTURE) IMPLANT
SUT VIC AB 0 CTX 36 (SUTURE) ×4
SUT VIC AB 0 CTX36XBRD ANBCTRL (SUTURE) ×2 IMPLANT
SYR CONTROL 10ML LL (SYRINGE) ×2 IMPLANT
TOWEL OR 17X24 6PK STRL BLUE (TOWEL DISPOSABLE) ×2 IMPLANT
TRAY FOLEY CATH SILVER 14FR (SET/KITS/TRAYS/PACK) ×2 IMPLANT

## 2014-12-05 NOTE — Anesthesia Preprocedure Evaluation (Addendum)
Anesthesia Evaluation  Patient identified by MRN, date of birth, ID band Patient awake    Reviewed: Allergy & Precautions, H&P , Patient's Chart, lab work & pertinent test results, reviewed documented beta blocker date and time   Airway Mallampati: II  TM Distance: >3 FB Neck ROM: full    Dental no notable dental hx.    Pulmonary former smoker,  breath sounds clear to auscultation  Pulmonary exam normal       Cardiovascular Rhythm:regular Rate:Normal     Neuro/Psych    GI/Hepatic   Endo/Other    Renal/GU      Musculoskeletal   Abdominal   Peds  Hematology   Anesthesia Other Findings Hx of GA for CS prior thrombocytopenia. Recent plts are 58k. I asked Dr Clearance Coots if he wanted to wait for platelets to be available before we proceed and he says no.( 16:19)  Reproductive/Obstetrics                            Anesthesia Physical Anesthesia Plan  ASA: II and emergent  Anesthesia Plan: General   Post-op Pain Management:    Induction: Intravenous  Airway Management Planned: Oral ETT  Additional Equipment:   Intra-op Plan:   Post-operative Plan: Extubation in OR  Informed Consent: I have reviewed the patients History and Physical, chart, labs and discussed the procedure including the risks, benefits and alternatives for the proposed anesthesia with the patient or authorized representative who has indicated his/her understanding and acceptance.   Dental Advisory Given and Dental advisory given  Plan Discussed with: CRNA and Surgeon  Anesthesia Plan Comments: (  Discussed general anesthesia, including possible nausea, instrumentation of airway, sore throat,pulmonary aspiration, etc. I asked if the were any outstanding questions, or  concerns before we proceeded. )        Anesthesia Quick Evaluation

## 2014-12-05 NOTE — MAU Note (Signed)
Water broke around 1130, clear fluid- still coming.  Not contracting, denies bleeding.

## 2014-12-05 NOTE — Op Note (Signed)
Cesarean Section Procedure Note   Emily Morse   12/05/2014  Indications: Previous C/S x 2.  Desires repeat C/S and Permanent Sterilization   Pre-operative Diagnosis: Previous C/S x 2, Desires Repeat C/S, Desires Sterilization, Gestational Thrombocytopenia.   Post-operative Diagnosis: Same   Procedure: Repeat LTCS and BPS  Surgeon: Anatalia Kronk A  Assistants: Kathreen Cosier.  Anesthesia: general  Procedure Details:  The patient was seen in the Holding Room. The risks, benefits, complications, treatment options, and expected outcomes were discussed with the patient. The patient concurred with the proposed plan, giving informed consent. The patient was identified as Emily Morse and the procedure verified as C-Section Delivery. A Time Out was held and the above information confirmed.  After induction of anesthesia, the patient was draped and prepped in the usual sterile manner. A transverse incision was made and carried down through the subcutaneous tissue to the fascia. The fascial incision was made and extended transversely. The fascia was separated from the underlying rectus tissue superiorly and inferiorly. The peritoneum was identified and entered. The peritoneal incision was extended longitudinally. The utero-vesical peritoneal reflection was incised transversely and the bladder flap was bluntly freed from the lower uterine segment. A low transverse uterine incision was made. Delivered from cephalic presentation was a 3565 gram living newborn female infant(s). APGAR (1 MIN): 9   APGAR (5 MINS): 9   APGAR (10 MINS):    A cord ph was not sent. The umbilical cord was clamped and cut cord. A sample was obtained for evaluation. The placenta was removed Intact and appeared normal.  The uterine incision was closed with running locked sutures of 0 Monocryl.  A left broad ligament hematoma was suture ligated with 0 Monocryl.  Hemostasis was observed.  Attention then turned above to tubal  ligation.  The left fallopian tube grasped with Babcock clamp in the isthmic area and suture ligated through the mesosalpinx with 2-0 plain catgut.  The knuckle of tube above the knot was excised with Metzenbaum scissors and submitted to Pathology.  The tubal stumps were hemostatic, and were placed back in their normal anatomic position.  The same procedure was performed on the opposite side without complications. The parieto peritoneum was closed in a running fashion with 2-0 Vicryl.  The fascia was then reapproximated with running sutures of 0 Vicryl.  The skin was closed with staples.  Instrument, sponge, and needle counts were correct prior the abdominal closure and were correct at the conclusion of the case.    Findings: Normal uterus, ovaries and tubes   Estimated Blood Loss:  Total IV Fluids:   Urine Output: 300CC OF clear urine  Specimens: Placenta to pathology  Complications: no complications  Disposition: PACU - hemodynamically stable.  Maternal Condition: stable   Baby condition / location:  Couplet care / Skin to Skin    Signed: Surgeon(s): Brock Bad, MD

## 2014-12-05 NOTE — H&P (Signed)
Emily Morse is a 29 y.o. female presenting for SROM. Maternal Medical History:  Reason for admission: Rupture of membranes and contractions.   Contractions: Onset was 6-12 hours ago.    Fetal activity: Perceived fetal activity is normal.   Last perceived fetal movement was within the past hour.    Prenatal complications: Thrombocytopenia.   Prenatal Complications - Diabetes: none.    OB History    Gravida Para Term Preterm AB TAB SAB Ectopic Multiple Living   0 Past Medical History  Diagnosis Date  . Herpes     last out break 2008  . Thrombocytopenia    Past Surgical History  Procedure Laterality Date  . Dilation and curettage of uterus    . Cesarean section  10/22/2011    Procedure: CESAREAN SECTION;  Surgeon: Brock Bad, MD;  Location: [facility];  Service: Gynecology;  Laterality: N/A;  . Cesarean section N/A 12/07/2013    Procedure: CESAREAN SECTION;  Surgeon: Brock Bad, MD;  Location: [facility];  Service: Obstetrics;  Laterality: N/A;   Family History: family history is negative for Other, Alcohol abuse, Arthritis, Asthma, Birth defects, Cancer, COPD, Depression, Diabetes, Drug abuse, Early death, Hearing loss, Heart disease, Hyperlipidemia, Hypertension, Kidney disease, Learning disabilities, Mental illness, Mental retardation, Miscarriages / Stillbirths, Stroke, Vision loss, and Varicose Veins. Social History:  reports that she quit smoking about 19 months ago. She has never used smokeless tobacco. She reports that she does not drink alcohol or use illicit drugs.   Prenatal Transfer Tool  Maternal Diabetes: No Genetic Screening: Normal Maternal Ultrasounds/Referrals: Normal Fetal Ultrasounds or other Referrals:  Referred to Materal Fetal Medicine  Maternal Substance Abuse:  No Significant Maternal Medications:  Meds include: Other: Prednisone Significant Maternal Lab Results:  Lab values include: Other:  Other Comments:  Gestational  Thrombocytopenia  Review of Systems  All other systems reviewed and are negative.   Dilation: Closed Effacement (%): Thick Station:  (high) Exam by:: Dellie Burns, RN BSN Blood pressure 135/78, pulse 74, temperature 98.5 F (36.9 C), temperature source Oral, resp. rate 16, height 5' 3.5" (1.613 m), weight 149 lb 6.4 oz (67.767 kg), last menstrual period 03/12/2014, not currently breastfeeding. Maternal Exam:  Uterine Assessment: Contraction strength is mild.  Contraction frequency is irregular.   Abdomen: Patient reports no abdominal tenderness.   Physical Exam  Nursing note and vitals reviewed. Constitutional: She is oriented to person, place, and time. She appears well-developed and well-nourished.  HENT:  Head: Normocephalic and atraumatic.  Eyes: Conjunctivae are normal. Pupils are equal, round, and reactive to light.  Neck: Normal range of motion. Neck supple.  Cardiovascular: Normal rate and regular rhythm.   Respiratory: Effort normal.  GI: Soft.  Musculoskeletal: Normal range of motion.  Neurological: She is alert and oriented to person, place, and time.  Skin: Skin is warm and dry.  Psychiatric: She has a normal mood and affect. Her behavior is normal. Judgment and thought content normal.    Prenatal labs: ABO, Rh: B/POS/-- (04/20 1435) Antibody: NEG (04/20 1435) Rubella: 3.61 (04/20 1435) RPR: NON REAC (06/28 1158)  HBsAg: NEGATIVE (04/20 1435)  HIV: NONREACTIVE (06/28 1158)  GBS: NOT DETECTED (08/15 1138)   Assessment/Plan: 38 weeks.  Gestational Thrombocytopenia.  SROM.  Desires tubal ligation.  Will proceed with C/S and BTL.   Johnn Krasowski A 12/05/2014, 4:28 PM

## 2014-12-05 NOTE — Consult Note (Signed)
Neonatology Note:   Attendance at C-section:   I was asked by Dr. [name] to attend this repeat C/S at term, being performed under general anesthesia due to maternal thrombocytopenia. The mother is a G5P2A2 B pos, GBS neg with gestational thrombocytopenia, on Prednisone. ROM at delivery, fluid clear. Infant vigorous with good spontaneous cry and tone. Needed no suctioning. Ap 9/9. Lungs clear to ausc in DR. To CN to care of Pediatrician.  Itzia Cunliffe C. Delayza Lungren, MD 

## 2014-12-05 NOTE — Transfer of Care (Signed)
Immediate Anesthesia Transfer of Care Note  Patient: Emily Morse  Procedure(s) Performed: Procedure(s): CESAREAN SECTION (N/A)  Patient Location: PACU  Anesthesia Type:General  Level of Consciousness: awake, alert  and oriented  Airway & Oxygen Therapy: Patient Spontanous Breathing and Patient connected to nasal cannula oxygen  Post-op Assessment: Report given to RN, Post -op Vital signs reviewed and stable and Patient moving all extremities  Post vital signs: Reviewed and stable  Last Vitals:  Filed Vitals:   12/05/14 1249  BP: 135/78  Pulse: 74  Temp: 36.9 C  Resp: 16    Complications: No apparent anesthesia complications

## 2014-12-05 NOTE — Anesthesia Postprocedure Evaluation (Signed)
  Anesthesia Post-op Note  Patient: Emily Morse  Procedure(s) Performed: Procedure(s): CESAREAN SECTION (N/A)  Patient Location: PACU  Anesthesia Type:General  Level of Consciousness: awake and alert   Airway and Oxygen Therapy: Patient Spontanous Breathing  Post-op Pain: mild  Post-op Assessment: Post-op Vital signs reviewed, Patient's Cardiovascular Status Stable, Respiratory Function Stable, Patent Airway and No signs of Nausea or vomiting              Post-op Vital Signs: Reviewed and stable  Last Vitals:  Filed Vitals:   12/05/14 1900  BP: 127/79  Pulse: 69  Temp:   Resp: 10    Complications: No apparent anesthesia complications

## 2014-12-05 NOTE — MAU Provider Note (Signed)
Emily Morse is a 29 y.o. [id number] at [redacted]w[redacted]d  who presents to MAU today complaining of LOF since earlier today. She states that she soaked through 2 pairs of underwear. She denies vaginal bleeding or regular contractions. She reports fetal movement slightly less than earlier in the pregnancy.    BP 135/78 mmHg  Pulse 74  Temp(Src) 98.5 F (36.9 C) (Oral)  Resp 16  Ht 5' 3.5" (1.613 m)  Wt 149 lb 6.4 oz (67.767 kg)  BMI 26.05 kg/m2  LMP 03/12/2014 GENERAL: Well-developed, well-nourished female in no acute distress.  HEAD: Normocephalic, atraumatic.  CHEST: Normal effort of breathing, regular heart rate ABDOMEN: Soft, nontender, nondistended.  PELVIC: Normal external female genitalia. Vagina is pink and rugated.  Normal discharge.  + pooling. Gravid uterus.   EXTREMITIES: No cyanosis. Mild non-pitting edema  Fern - Positive  Fetal Monitoring:  Baseline: 130 bpm, moderate variability, + accelerations, no decelerations Contractions: irregular, few, mild  A: SROM  P: Report given to RN to contact MD on call for further instructions  Marny Lowenstein, PA-C 12/05/2014 2:13 PM

## 2014-12-06 ENCOUNTER — Encounter (HOSPITAL_COMMUNITY): Payer: Self-pay | Admitting: Obstetrics

## 2014-12-06 ENCOUNTER — Other Ambulatory Visit: Payer: Self-pay | Admitting: Obstetrics

## 2014-12-06 LAB — CBC
HCT: 29.8 % — ABNORMAL LOW (ref 36.0–46.0)
Hemoglobin: 10.2 g/dL — ABNORMAL LOW (ref 12.0–15.0)
MCH: 32.1 pg (ref 26.0–34.0)
MCHC: 34.2 g/dL (ref 30.0–36.0)
MCV: 93.7 fL (ref 78.0–100.0)
Platelets: 77 10*3/uL — ABNORMAL LOW (ref 150–400)
RBC: 3.18 MIL/uL — ABNORMAL LOW (ref 3.87–5.11)
RDW: 15.6 % — ABNORMAL HIGH (ref 11.5–15.5)
WBC: 12.9 10*3/uL — ABNORMAL HIGH (ref 4.0–10.5)

## 2014-12-06 LAB — RPR: RPR Ser Ql: NONREACTIVE

## 2014-12-06 NOTE — Anesthesia Postprocedure Evaluation (Signed)
Anesthesia Post Note  Patient: Emily Morse  Procedure(s) Performed: Procedure(s) (LRB): CESAREAN SECTION (N/A)  Anesthesia type: General  Patient location: Mother/Baby  Post pain: Pain level controlled  Post assessment: Post-op Vital signs reviewed  Last Vitals:  Filed Vitals:   12/06/14 0700  BP:   Pulse: 69  Temp:   Resp: 16    Post vital signs: Reviewed  Level of consciousness: awake and alert   Complications: No apparent anesthesia complications

## 2014-12-06 NOTE — Progress Notes (Signed)
Subjective: Postpartum Day #1: Cesarean Delivery Patient reports tolerating PO.    Objective: Vital signs in last 24 hours: Temp:  [97.9 F (36.6 C)-98.8 F (37.1 C)] 98.7 F (37.1 C) (08/30 0545) Pulse Rate:  [62-88] 69 (08/30 0700) Resp:  [10-24] 16 (08/30 0700) BP: (125-153)/(74-88) 139/79 mmHg (08/30 0545) SpO2:  [99 %-100 %] 100 % (08/30 0700) FiO2 (%):  [21 %] 21 % (08/30 0205) Weight:  [149 lb 6.4 oz (67.767 kg)-149 lb 6.5 oz (67.77 kg)] 149 lb 6.5 oz (67.77 kg) (08/30 0001)  Physical Exam:  General: alert, cooperative and no distress Lochia: appropriate Uterine Fundus: firm Incision: no significant drainage DVT Evaluation: No evidence of DVT seen on physical exam. Negative Homan's sign. No cords or calf tenderness. No significant calf/ankle edema.   Recent Labs  12/05/14 1510 12/06/14 0520  HGB 11.5* 10.2*  HCT 33.4* 29.8*    Assessment/Plan: Status post Cesarean section. Doing well postoperatively.  Continue current care.  D/C PCA, Hep lock IV site, D/C foley when ambulating.    Emily Morse 12/06/2014, 8:22 AM

## 2014-12-06 NOTE — Progress Notes (Signed)
UR chart review completed.  

## 2014-12-07 ENCOUNTER — Encounter (HOSPITAL_COMMUNITY): Payer: Self-pay | Admitting: *Deleted

## 2014-12-07 ENCOUNTER — Other Ambulatory Visit: Payer: Medicaid Other

## 2014-12-07 MED ORDER — IBUPROFEN 800 MG PO TABS: 800.0000 mg | ORAL_TABLET | Freq: Three times a day (TID) | ORAL | Status: DC | PRN

## 2014-12-07 MED ORDER — SIMETHICONE 80 MG PO CHEW: 80.0000 mg | CHEWABLE_TABLET | Freq: Three times a day (TID) | ORAL | Status: DC

## 2014-12-07 MED ORDER — OXYCODONE-ACETAMINOPHEN 5-325 MG PO TABS: 2.0000 | ORAL_TABLET | ORAL | Status: DC | PRN

## 2014-12-07 NOTE — Progress Notes (Signed)
Subjective: Postpartum Day #2: Cesarean Delivery Patient reports tolerating PO, + flatus and no problems voiding.    Objective: Vital signs in last 24 hours: Temp:  [98.3 F (36.8 C)-98.5 F (36.9 C)] 98.3 F (36.8 C) (08/31 0549) Pulse Rate:  [58-71] 58 (08/31 0549) Resp:  [16-18] 16 (08/31 0549) BP: (114-138)/(68-74) 114/68 mmHg (08/31 0549) SpO2:  [98 %] 98 % (08/30 0913)  Physical Exam:  General: alert, cooperative and no distress Lochia: appropriate Uterine Fundus: firm Incision: no significant drainage DVT Evaluation: No evidence of DVT seen on physical exam. Negative Homan's sign. No cords or calf tenderness. No significant calf/ankle edema.   Recent Labs  12/05/14 1510 12/06/14 0520  HGB 11.5* 10.2*  HCT 33.4* 29.8*    Assessment/Plan: Status post Cesarean section. Doing well postoperatively.  Discharge home today, come to clinic Friday for staple removal.  Emily Morse Emily Morse 12/07/2014, 8:37 AM

## 2014-12-07 NOTE — Discharge Summary (Signed)
Obstetric Discharge Summary Reason for Admission: cesarean section and tubal sterilization Prenatal Procedures: none Intrapartum Procedures: cesarean: low cervical, transverse and bilateral partial salpingectomy Postpartum Procedures: none Complications-Operative and Postpartum: none HEMOGLOBIN  Date Value Ref Range Status  12/06/2014 10.2* 12.0 - 15.0 g/dL Final   HGB  Date Value Ref Range Status  11/30/2014 11.2* 11.6 - 15.9 g/dL Final   HCT  Date Value Ref Range Status  12/06/2014 29.8* 36.0 - 46.0 % Final  11/30/2014 33.0* 34.8 - 46.6 % Final    Physical Exam:  General: alert, cooperative and no distress Lochia: appropriate Uterine Fundus: firm Incision: no significant drainage DVT Evaluation: No evidence of DVT seen on physical exam. Negative Homan's sign. No cords or calf tenderness. No significant calf/ankle edema.  Discharge Diagnoses: Term Pregnancy-delivered  Discharge Information: Date: 12/07/2014 Activity: pelvic rest Diet: routine Medications: PNV, Ibuprofen, Colace, Iron and Percocet Condition: stable Instructions: refer to practice specific booklet Discharge to: home   Newborn Data: Live born female  Birth Weight: 7 lb 13.8 oz (3566 g) APGAR: 9, 9  Home with mother.  Rachelle A Denney 12/07/2014, 8:41 AM

## 2014-12-07 NOTE — Discharge Instructions (Signed)
Call clinic to schedule staple removal for Friday.    Cesarean Section (Postpartum Care) These discharge instructions provide you with general information on cesarean section (caesarean, cesarian, caesarian) and caring for yourself after you leave the hospital. Your caregiver may also give you specific instructions. Please read these instructions and refer to them in the next few weeks. If you have any questions regarding these instructions, you may call the nursing unit. If you have any problems after discharge, please call your doctor. If you are unable to reach your doctor, you should seek help at the nearest Emergency Department. ACTIVITY  Rest as much as possible the first two weeks at home.   When possible, have someone help you with your household activities and the new baby for 2 to 3 weeks.   Limit your housework and social activity. Increase your activity gradually as your strength returns.   Do not climb stairs more than two or three times a day.   Do not lift anything heavier than your baby.   Follow your doctor's instructions about driving a car.   Limit wearing support panties or control-top hose, since relying on your own muscles helps strengthen them.   Ask your doctor about exercises.  NUTRITION  You may return to your usual diet.   Drink 6 to 8 glasses of fluid a day.   Eat a well-balanced diet. Include portions of food from the meat/protein, milk, fruit, vegetable and bread groups.   Keep taking your prenatal or multivitamins.  ELIMINATION You should return to your usual bowel function. If constipation is a problem, you may take a mild laxative such as Milk of Magnesia with your caregivers permission. Gradually add fruit, vegetables and bran to your diet. Make sure to increase your fluids. HYGIENE You may shower, wash your hair and take tub baths unless your doctor tells you otherwise. Continue peri-care until your vaginal bleeding and discharge stops. Do not  douche or use tampons until your caregiver says it is OK. FEVER If you feel feverish or have shaking chills, take your temperature. If your temperature is 101 F (38.3 C), or is 100.4 F (38 C) two times in a four hour period, call your doctor. The fever may indicate infection. If you call early, infection can be treated with medicines that kill germs (antibiotics). Hospitalization may be avoided. PAIN CONTROL You may still have mild discomfort. Only take over-the-counter or prescription medicine as directed by your caregiver. Do not take aspirin; it can cause bleeding. If the pain is not relieved by your medicine or becomes worse, call your caregiver. INCISION CARE Clean your cut (incision) gently with soap and water. If your caregiver says it is okay, leave the incision without a dressing unless it is draining or irritated. If you have small adhesive strips across the incision and they do not fall off within 7 days, carefully peel them off. Check the incision daily for increased redness, drainage, swelling or separation of skin. Call your caregiver if any of these happen. VAGINAL CARE You may have a vaginal discharge or bleeding for up to 6 weeks. If the vaginal discharge becomes bright red, bad smelling, heavy in amount, has blood clots or if you have burning or frequency when urinating, call your caregiver. SEXUAL INTERCOURSE It is best to follow your caregiver's advice about when you may safely resume sexual intercourse. Most women can begin to have intercourse two to three weeks after their baby's birth. You can become pregnant before you have a  period. If you decide to have sexual intercourse, you should use birth control if you do not want to become pregnant right away.  BREAST CARE If you are not breastfeeding and your breasts become tender, hard or leak milk, you may wear a firm fitting bra and apply ice to the breasts. If you are breastfeeding, wear a good support bra. Call your caregiver  if you have breast pain, flu-like symptoms, fever or hardness and reddening of your breasts. POSTPARTUM BLUES After the excitement of having the baby goes away, you may commonly have a period of low spirits or blues." Discuss your feelings with your partner, family and friends. This may be caused by the changing hormone levels in your body. You may want to contact your caregiver if this is worrisome. SEEK MEDICAL CARE IF:  There is swelling, redness or increasing pain in the wound area.   Pus is coming from the wound.   You notice a bad smell from the wound or surgical dressing.   You have pain, redness and swelling from the intravenous site.   The wound is breaking open (the edges are not staying together).   You feel dizzy or feel like fainting.   You develop pain or bleeding when you urinate.   You develop diarrhea.   You develop nausea and vomiting.   You develop abnormal vaginal discharge.   You develop a rash.   You have any type of abnormal reaction or develop an allergy to your medication.   You need stronger pain medication for your pain.  SEEK IMMEDIATE MEDICAL CARE:  You develop a temperature of 101 or higher.   You develop abdominal pain.   You develop chest pain.   You develop shortness of breath.   You pass out.   You develop pain, swelling or redness of your leg.   You develop heavy vaginal bleeding with or without blood clots.  Document Released: 12/15/2001 Document Re-Released: 09/12/2009 [facility] Patient Information 2011 [place], [place].

## 2014-12-07 NOTE — Progress Notes (Signed)
Staples taken out by RN Randa Lynn strips and honeycomb applied. Teaching done.

## 2014-12-08 ENCOUNTER — Encounter: Payer: Medicaid Other | Admitting: Obstetrics

## 2014-12-08 LAB — TYPE AND SCREEN
ABO/RH(D): B POS
Antibody Screen: NEGATIVE
Unit division: 0
Unit division: 0

## 2014-12-14 ENCOUNTER — Other Ambulatory Visit: Payer: Medicaid Other

## 2014-12-15 ENCOUNTER — Telehealth: Payer: Self-pay | Admitting: Hematology and Oncology

## 2014-12-15 NOTE — Telephone Encounter (Signed)
s.w. pt and advised on added lab.....pt ok and aware °

## 2014-12-19 ENCOUNTER — Encounter: Payer: Self-pay | Admitting: Obstetrics

## 2014-12-19 ENCOUNTER — Ambulatory Visit (INDEPENDENT_AMBULATORY_CARE_PROVIDER_SITE_OTHER): Payer: Medicaid Other | Admitting: Obstetrics

## 2014-12-19 DIAGNOSIS — R309 Painful micturition, unspecified: Secondary | ICD-10-CM

## 2014-12-19 DIAGNOSIS — I10 Essential (primary) hypertension: Secondary | ICD-10-CM

## 2014-12-19 DIAGNOSIS — G8918 Other acute postprocedural pain: Secondary | ICD-10-CM

## 2014-12-19 DIAGNOSIS — N390 Urinary tract infection, site not specified: Secondary | ICD-10-CM

## 2014-12-19 LAB — POCT URINALYSIS DIPSTICK
Bilirubin, UA: NEGATIVE
Blood, UA: 250
Glucose, UA: NEGATIVE
Ketones, UA: NEGATIVE
Nitrite, UA: NEGATIVE
Spec Grav, UA: 1.02
Urobilinogen, UA: NEGATIVE
pH, UA: 5.5

## 2014-12-19 MED ORDER — OXYCODONE HCL 10 MG PO TABS: 10.0000 mg | ORAL_TABLET | Freq: Four times a day (QID) | ORAL | Status: DC | PRN

## 2014-12-19 MED ORDER — TRIAMTERENE-HCTZ 50-25 MG PO CAPS: 1.0000 | ORAL_CAPSULE | ORAL | Status: DC

## 2014-12-19 MED ORDER — NITROFURANTOIN MONOHYD MACRO 100 MG PO CAPS: 100.0000 mg | ORAL_CAPSULE | Freq: Two times a day (BID) | ORAL | Status: DC

## 2014-12-19 NOTE — Progress Notes (Signed)
Subjective:     Emily Morse is a 29 y.o. female who presents for a postpartum visit. She is 2 weeks postpartum following a low cervical transverse Cesarean section. I have fully reviewed the prenatal and intrapartum course. The delivery was at 38 gestational weeks. Outcome: repeat cesarean section, low transverse incision and BTL. Anesthesia: spinal. Postpartum course has been normal. Baby's course has been normal. Baby is feeding by bottle - Similac Advance. Bleeding thin lochia. Bowel function is normal. Bladder function is normal. Patient is not sexually active. Contraception method is tubal ligation. Postpartum depression screening: negative.  Tobacco, alcohol and substance abuse history reviewed.  Adult immunizations reviewed including TDAP, rubella and varicella.  The following portions of the patient's history were reviewed and updated as appropriate: allergies, current medications, past family history, past medical history, past social history, past surgical history and problem list.  Review of Systems A comprehensive review of systems was negative.   Objective:    BP 139/90 mmHg  Pulse 77  Temp(Src) 98.3 F (36.8 C)  Wt 127 lb 9.6 oz (57.879 kg)   PE:        General:  Alert and no distress      Abdomen:  Soft, non tender.  Incision C, D, I and NT.      Extremities:  No C, C, E.  Assessment:    2 weeks post op C/S and BTL.  Doing well.  Mild Hypertension.  Plan:    1. Contraception: tubal ligation - done 2. Dyazide Rx 3. Follow up in: 4 weeks or as needed.   Healthy lifestyle practices reviewed

## 2014-12-19 NOTE — Addendum Note (Signed)
Addended by: Marya Landry D on: 12/19/2014 12:06 PM   Modules accepted: Orders

## 2014-12-21 ENCOUNTER — Other Ambulatory Visit: Payer: Medicaid Other

## 2014-12-21 ENCOUNTER — Other Ambulatory Visit: Payer: Self-pay | Admitting: Obstetrics

## 2014-12-21 DIAGNOSIS — N390 Urinary tract infection, site not specified: Secondary | ICD-10-CM

## 2014-12-21 LAB — URINE CULTURE

## 2014-12-21 MED ORDER — CEFUROXIME AXETIL 500 MG PO TABS: 500.0000 mg | ORAL_TABLET | Freq: Two times a day (BID) | ORAL | Status: DC

## 2014-12-23 ENCOUNTER — Telehealth: Payer: Self-pay | Admitting: *Deleted

## 2014-12-23 NOTE — Telephone Encounter (Signed)
Spoke with patient, per dr Bertis Ruddy, she is to continue the same dose of prednisone. Patient verbalizes understanding.

## 2014-12-23 NOTE — Telephone Encounter (Signed)
-----   Message from Artis Delay, MD sent at 11/30/2014  8:58 AM EDT ----- Regarding: Pls give patient a copy and send her home Stay at same dose prednisone ----- Message -----    From: [facility] Interface    Sent: 11/30/2014   8:53 AM      To: Artis Delay, MD

## 2015-01-16 ENCOUNTER — Encounter: Payer: Self-pay | Admitting: Obstetrics

## 2015-01-16 ENCOUNTER — Ambulatory Visit (INDEPENDENT_AMBULATORY_CARE_PROVIDER_SITE_OTHER): Payer: Medicaid Other | Admitting: Obstetrics

## 2015-01-16 ENCOUNTER — Telehealth: Payer: Self-pay

## 2015-01-16 DIAGNOSIS — M545 Low back pain, unspecified: Secondary | ICD-10-CM

## 2015-01-16 DIAGNOSIS — G8918 Other acute postprocedural pain: Secondary | ICD-10-CM

## 2015-01-16 DIAGNOSIS — I10 Essential (primary) hypertension: Secondary | ICD-10-CM

## 2015-01-16 MED ORDER — CARVEDILOL 12.5 MG PO TABS: 12.5000 mg | ORAL_TABLET | Freq: Two times a day (BID) | ORAL | Status: DC

## 2015-01-16 MED ORDER — OXYCODONE HCL 10 MG PO TABS: 10.0000 mg | ORAL_TABLET | Freq: Four times a day (QID) | ORAL | Status: DC | PRN

## 2015-01-16 NOTE — Progress Notes (Signed)
Subjective:     Emily Morse is a 29 y.o. female who presents for a postpartum visit. She is 6 weeks postpartum following a low cervical transverse Cesarean section. I have fully reviewed the prenatal and intrapartum course. The delivery was at 38.2 gestational weeks. Outcome: repeat cesarean section, low transverse incision. Anesthesia: general. Postpartum course has been complicated by back pain, HA and HTN. Baby's course has been normal. Baby is feeding by bottle - Similac Advance. Bleeding no bleeding. Bowel function is normal. Bladder function is normal. Patient is not sexually active. Contraception method is tubal ligation. Postpartum depression screening: negative.  Tobacco, alcohol and substance abuse history reviewed.  Adult immunizations reviewed including TDAP, rubella and varicella.  The following portions of the patient's history were reviewed and updated as appropriate: allergies, current medications, past family history, past medical history, past social history, past surgical history and problem list.  Review of Systems  A comprehensive review of systems was negative.   Except for backache and headaches.  Objective:    BP 155/99 mmHg  Pulse 61  Wt 138 lb (62.596 kg)  General:  alert and no distress   Breasts:  inspection negative, no nipple discharge or bleeding, no masses or nodularity palpable  Lungs: clear to auscultation bilaterally  Heart:  regular rate and rhythm, S1, S2 normal, no murmur, click, rub or gallop  Abdomen: normal findings: soft, non-tender and incision C, D, I and non tender.   Vulva:  normal  Vagina: normal vagina  Cervix:  no cervical motion tenderness  Corpus: normal size, contour, position, consistency, mobility, non-tender  Adnexa:  no mass, fullness, tenderness  Rectal Exam: Not performed.           Assessment:     Normal postpartum exam. Pap smear not done at today's visit.     Hypertenson   Backache.  H/O Scoliosis.    Plan:    1.  Contraception: tubal ligation 2. Dyazide and Coreg Rx for HTN and patient referred to Internal Medicine for F/U 3. Referred to Orthopedics for backache 4. Follow up in: 6 weeks or as needed.   Healthy lifestyle practices reviewed

## 2015-01-16 NOTE — Telephone Encounter (Signed)
PATIENT WILL HAS [employer] C[facility] - TRYING TO CONTACT THEM TO HELP WITH REFERRALS - COULD NOT GET THROUGH - SHE MAY CHANGE HER CARD TO [facility]

## 2015-01-26 ENCOUNTER — Ambulatory Visit: Payer: Medicaid Other | Admitting: Hematology and Oncology

## 2015-01-26 ENCOUNTER — Telehealth: Payer: Self-pay | Admitting: Hematology and Oncology

## 2015-01-26 ENCOUNTER — Other Ambulatory Visit: Payer: Medicaid Other

## 2015-01-26 NOTE — Telephone Encounter (Signed)
s.w. pt and r/s missed appt.....pt ok and aware °

## 2015-02-13 ENCOUNTER — Telehealth: Payer: Self-pay | Admitting: Hematology and Oncology

## 2015-02-13 NOTE — Telephone Encounter (Signed)
Returned call and s.w pt mom and confirmed appts....Marland Kitchen.ok and aware

## 2015-02-16 ENCOUNTER — Ambulatory Visit: Payer: Medicaid Other | Admitting: Hematology and Oncology

## 2015-02-16 ENCOUNTER — Telehealth: Payer: Self-pay | Admitting: Hematology and Oncology

## 2015-02-16 ENCOUNTER — Other Ambulatory Visit: Payer: Medicaid Other

## 2015-02-16 NOTE — Telephone Encounter (Signed)
pt cldto CX appt-stated will call back to r/s ata later date °

## 2015-03-01 ENCOUNTER — Encounter: Payer: Self-pay | Admitting: Obstetrics

## 2015-03-01 ENCOUNTER — Ambulatory Visit (INDEPENDENT_AMBULATORY_CARE_PROVIDER_SITE_OTHER): Payer: Medicaid Other | Admitting: Obstetrics

## 2015-03-01 VITALS — BP 119/75 | HR 82 | Temp 98.5°F | Ht 64.0 in | Wt 137.0 lb

## 2015-03-01 DIAGNOSIS — D696 Thrombocytopenia, unspecified: Secondary | ICD-10-CM

## 2015-03-01 DIAGNOSIS — G8918 Other acute postprocedural pain: Secondary | ICD-10-CM

## 2015-03-01 DIAGNOSIS — Z01419 Encounter for gynecological examination (general) (routine) without abnormal findings: Secondary | ICD-10-CM | POA: Diagnosis not present

## 2015-03-01 DIAGNOSIS — Z0000 Encounter for general adult medical examination without abnormal findings: Secondary | ICD-10-CM | POA: Diagnosis not present

## 2015-03-01 LAB — CBC
HCT: 39.7 % (ref 36.0–46.0)
Hemoglobin: 13.2 g/dL (ref 12.0–15.0)
MCH: 29.9 pg (ref 26.0–34.0)
MCHC: 33.2 g/dL (ref 30.0–36.0)
MCV: 90 fL (ref 78.0–100.0)
MPV: 12.4 fL (ref 8.6–12.4)
Platelets: 120 10*3/uL — ABNORMAL LOW (ref 150–400)
RBC: 4.41 MIL/uL (ref 3.87–5.11)
RDW: 14.1 % (ref 11.5–15.5)
WBC: 7 10*3/uL (ref 4.0–10.5)

## 2015-03-01 MED ORDER — OXYCODONE HCL 10 MG PO TABS: 10.0000 mg | ORAL_TABLET | Freq: Four times a day (QID) | ORAL | Status: DC | PRN

## 2015-03-01 NOTE — Progress Notes (Signed)
Subjective:        Emily Morse is a 29 y.o. female here for a routine exam.  Current complaints: Backache.  Has been seen by Orthopedics and PT is recommended.  Personal health questionnaire:  Is patient Ashkenazi Jewish, have a family history of breast and/or ovarian cancer: no Is there a family history of uterine cancer diagnosed at age < 56, gastrointestinal cancer, urinary tract cancer, family member who is a Personnel officer syndrome-associated carrier: no Is the patient overweight and hypertensive, family history of diabetes, personal history of gestational diabetes, preeclampsia or PCOS: no Is patient over 78, have PCOS,  family history of premature CHD under age 14, diabetes, smoke, have hypertension or peripheral artery disease:  no At any time, has a partner hit, kicked or otherwise hurt or frightened you?: no Over the past 2 weeks, have you felt down, depressed or hopeless?: no Over the past 2 weeks, have you felt little interest or pleasure in doing things?:no   Gynecologic History Patient's last menstrual period was 02/07/2015. Contraception: tubal ligation Last Pap: 2015. Results were: normal Last mammogram: n/a. Results were: normal  Obstetric History OB History  Gravida Para Term Preterm AB SAB TAB Ectopic Multiple Living  0 2 2   0 3    # Outcome Date GA Lbr Len/2nd Weight Sex Delivery Anes PTL Lv  5 Term 12/05/14 [redacted]w[redacted]d  7 lb 13.8 oz (3.566 kg) Imagene Riches  Y  4 Term 12/07/13 [redacted]w[redacted]d  8 lb 0.6 oz (3.646 kg) Imagene Riches  Y  3 Term 10/22/11 [redacted]w[redacted]d  7 lb 9.9 oz (3.455 kg) F CS-LTranv EPI  Y  2 SAB 2010 [redacted]w[redacted]d            Comments: neural tube defect iufd  1 SAB 2008 [redacted]w[redacted]d             Past Medical History  Diagnosis Date  . Herpes     last out break 2008  . Thrombocytopenia (HCC)   . Anemia   . Hx of gastroesophageal reflux (GERD)     Past Surgical History  Procedure Laterality Date  . Dilation and curettage of uterus    . Cesarean section   10/22/2011    Procedure: CESAREAN SECTION;  Surgeon: Brock Bad, MD;  Location: [facility];  Service: Gynecology;  Laterality: N/A;  . Cesarean section N/A 12/07/2013    Procedure: CESAREAN SECTION;  Surgeon: Brock Bad, MD;  Location: [facility];  Service: Obstetrics;  Laterality: N/A;  . Cesarean section N/A 12/05/2014    Procedure: CESAREAN SECTION;  Surgeon: Brock Bad, MD;  Location: [facility];  Service: Obstetrics;  Laterality: N/A;     Current outpatient prescriptions:  .  carvedilol (COREG) 12.5 MG tablet, Take 1 tablet (12.5 mg total) by mouth 2 (two) times daily with a meal., Disp: 60 tablet, Rfl: 11 .  predniSONE (DELTASONE) 20 MG tablet, Take 3 tablets (60 mg total) by mouth daily with breakfast., Disp: 90 tablet, Rfl: 3 .  triamterene-hydrochlorothiazide (DYAZIDE) 50-25 MG per capsule, Take 1 capsule by mouth every morning., Disp: 30 capsule, Rfl: 11 No Known Allergies  Social History  Substance Use Topics  . Smoking status: Former Smoker    Quit date: 05/06/2013  . Smokeless tobacco: Never Used     Comment: black and mild occasional  . Alcohol Use: No    Family History  Problem Relation Age of Onset  . Other Neg Hx   .  Alcohol abuse Neg Hx   . Arthritis Neg Hx   . Asthma Neg Hx   . Birth defects Neg Hx   . Cancer Neg Hx   . COPD Neg Hx   . Depression Neg Hx   . Diabetes Neg Hx   . Drug abuse Neg Hx   . Early death Neg Hx   . Hearing loss Neg Hx   . Heart disease Neg Hx   . Hyperlipidemia Neg Hx   . Hypertension Neg Hx   . Kidney disease Neg Hx   . Learning disabilities Neg Hx   . Mental illness Neg Hx   . Mental retardation Neg Hx   . Miscarriages / Stillbirths Neg Hx   . Stroke Neg Hx   . Vision loss Neg Hx   . Varicose Veins Neg Hx       Review of Systems  Constitutional: negative for fatigue and weight loss Respiratory: negative for cough and wheezing Cardiovascular: negative for chest pain, fatigue and palpitations Gastrointestinal: negative  for abdominal pain and change in bowel habits Musculoskeletal: positive for myalgias - backache Neurological: negative for gait problems and tremors Behavioral/Psych: negative for abusive relationship, depression Endocrine: negative for temperature intolerance   Genitourinary:negative for abnormal menstrual periods, genital lesions, hot flashes, sexual problems and vaginal discharge Integument/breast: negative for breast lump, breast tenderness, nipple discharge and skin lesion(s)    Objective:       BP 119/75 mmHg  Pulse 82  Temp(Src) 98.5 F (36.9 C)  Ht 5\' 4"  (1.626 m)  Wt 137 lb (62.143 kg)  BMI 23.50 kg/m2  LMP 02/07/2015 General:   alert  Skin:   no rash or abnormalities  Lungs:   clear to auscultation bilaterally  Heart:   regular rate and rhythm, S1, S2 normal, no murmur, click, rub or gallop  Breasts:   normal without suspicious masses, skin or nipple changes or axillary nodes  Abdomen:  normal findings: no organomegaly, soft, non-tender and no hernia  Pelvis:  External genitalia: normal general appearance Urinary system: urethral meatus normal and bladder without fullness, nontender Vaginal: normal without tenderness, induration or masses Cervix: normal appearance Adnexa: normal bimanual exam Uterus: anteverted and non-tender, normal size   Lab Review Urine pregnancy test Labs reviewed yes Radiologic studies reviewed yes    Assessment:    Healthy female exam.   Backache  H/O Thrombocytopenia  Plan:    Education reviewed: calcium supplements, safe sex/STD prevention and self breast exams. Contraception: tubal ligation. Follow up in: 1 year. Referred to PT for Backache.   No orders of the defined types were placed in this encounter.   Orders Placed This Encounter  Procedures  . SureSwab, Vaginosis/Vaginitis Plus  . CBC

## 2015-03-03 LAB — PAP IG W/ RFLX HPV ASCU

## 2015-03-07 LAB — SURESWAB, VAGINOSIS/VAGINITIS PLUS
Atopobium vaginae: NOT DETECTED Log (cells/mL)
C. albicans, DNA: DETECTED — AB
C. glabrata, DNA: NOT DETECTED
C. parapsilosis, DNA: NOT DETECTED
C. trachomatis RNA, TMA: NOT DETECTED
C. tropicalis, DNA: NOT DETECTED
Gardnerella vaginalis: NOT DETECTED Log (cells/mL)
LACTOBACILLUS SPECIES: 7.4 Log (cells/mL)
MEGASPHAERA SPECIES: NOT DETECTED Log (cells/mL)
N. gonorrhoeae RNA, TMA: NOT DETECTED
T. vaginalis RNA, QL TMA: NOT DETECTED

## 2015-03-08 ENCOUNTER — Other Ambulatory Visit: Payer: Self-pay | Admitting: Obstetrics

## 2015-03-08 DIAGNOSIS — B373 Candidiasis of vulva and vagina: Secondary | ICD-10-CM

## 2015-03-08 DIAGNOSIS — B3731 Acute candidiasis of vulva and vagina: Secondary | ICD-10-CM

## 2015-03-08 MED ORDER — FLUCONAZOLE 150 MG PO TABS: 150.0000 mg | ORAL_TABLET | Freq: Once | ORAL | Status: DC

## 2015-03-16 ENCOUNTER — Telehealth: Payer: Self-pay | Admitting: *Deleted

## 2015-03-16 NOTE — Telephone Encounter (Signed)
Patient is requesting a pain medication that is covered by her Medicaid for her cramping.

## 2015-03-17 ENCOUNTER — Other Ambulatory Visit: Payer: Self-pay | Admitting: Obstetrics

## 2015-03-17 DIAGNOSIS — N946 Dysmenorrhea, unspecified: Secondary | ICD-10-CM

## 2015-03-17 MED ORDER — TRAMADOL HCL 50 MG PO TABS: 50.0000 mg | ORAL_TABLET | Freq: Four times a day (QID) | ORAL | Status: DC | PRN

## 2015-03-17 NOTE — Telephone Encounter (Signed)
H/O thrombocytopenia.  Recommend Ultram. Ultram Rx

## 2015-03-21 NOTE — Telephone Encounter (Signed)
Patient notified per VM- Rx at pharmacy. 

## 2015-05-06 ENCOUNTER — Other Ambulatory Visit: Payer: Self-pay | Admitting: Obstetrics

## 2015-05-09 ENCOUNTER — Telehealth: Payer: Self-pay | Admitting: *Deleted

## 2015-05-09 NOTE — Telephone Encounter (Signed)
Patient is caliing about why her RX for Ambien was not refilled. 1/31 11:37 Call to patient -[facility] VM- Rx written 1/20 with 3 RF- not sure if she picked up or not, but it was printed.

## 2015-06-27 ENCOUNTER — Ambulatory Visit (INDEPENDENT_AMBULATORY_CARE_PROVIDER_SITE_OTHER): Payer: Medicaid Other | Admitting: Obstetrics

## 2015-06-27 ENCOUNTER — Telehealth: Payer: Self-pay

## 2015-06-27 ENCOUNTER — Encounter: Payer: Self-pay | Admitting: Obstetrics

## 2015-06-27 VITALS — BP 124/71 | HR 85 | Wt 123.0 lb

## 2015-06-27 DIAGNOSIS — M549 Dorsalgia, unspecified: Secondary | ICD-10-CM | POA: Diagnosis not present

## 2015-06-27 DIAGNOSIS — N760 Acute vaginitis: Secondary | ICD-10-CM | POA: Diagnosis not present

## 2015-06-27 DIAGNOSIS — N898 Other specified noninflammatory disorders of vagina: Secondary | ICD-10-CM

## 2015-06-27 DIAGNOSIS — A499 Bacterial infection, unspecified: Secondary | ICD-10-CM | POA: Diagnosis not present

## 2015-06-27 DIAGNOSIS — A6009 Herpesviral infection of other urogenital tract: Secondary | ICD-10-CM

## 2015-06-27 DIAGNOSIS — B373 Candidiasis of vulva and vagina: Secondary | ICD-10-CM

## 2015-06-27 DIAGNOSIS — A609 Anogenital herpesviral infection, unspecified: Secondary | ICD-10-CM

## 2015-06-27 DIAGNOSIS — B9689 Other specified bacterial agents as the cause of diseases classified elsewhere: Secondary | ICD-10-CM

## 2015-06-27 DIAGNOSIS — B3731 Acute candidiasis of vulva and vagina: Secondary | ICD-10-CM

## 2015-06-27 MED ORDER — VALACYCLOVIR HCL 1 G PO TABS: ORAL_TABLET | ORAL | Status: DC

## 2015-06-27 MED ORDER — FLUCONAZOLE 150 MG PO TABS: 150.0000 mg | ORAL_TABLET | Freq: Once | ORAL | Status: DC

## 2015-06-27 MED ORDER — METRONIDAZOLE 500 MG PO TABS: 500.0000 mg | ORAL_TABLET | Freq: Two times a day (BID) | ORAL | Status: DC

## 2015-06-27 NOTE — Telephone Encounter (Signed)
PATIENT HAS APPT WITH MUR[HY WAINER WITH DR. Gerri SporeWESLEY IBAZEBO ON 07/13/15 AT 8:45AM - CALLED HER 06/27/15

## 2015-06-28 ENCOUNTER — Encounter: Payer: Self-pay | Admitting: Obstetrics

## 2015-06-28 NOTE — Progress Notes (Signed)
Patient ID: Emily Morse, female   DOB: 05/10/1985, 30 y.o.   MRN: [id number]  Chief Complaint  Patient presents with  . Vaginal Discharge    Itching, Odor, Tingling on Right side of vagina     HPI Emily Fusangina N Nies is a 30 y.o. female.  Vaginal discharge with odor and irritation.  Tingling on right side of vagina.  Also has H/O chronic backache.  HPI  Past Medical History  Diagnosis Date  . Herpes     last out break 2008  . Thrombocytopenia (HCC)   . Anemia   . Hx of gastroesophageal reflux (GERD)     Past Surgical History  Procedure Laterality Date  . Dilation and curettage of uterus    . Cesarean section  10/22/2011    Procedure: CESAREAN SECTION;  Surgeon: Brock Badharles A Chandell Attridge, MD;  Location: [facility];  Service: Gynecology;  Laterality: N/A;  . Cesarean section N/A 12/07/2013    Procedure: CESAREAN SECTION;  Surgeon: Brock Badharles A Blakely Maranan, MD;  Location: [facility];  Service: Obstetrics;  Laterality: N/A;  . Cesarean section N/A 12/05/2014    Procedure: CESAREAN SECTION;  Surgeon: Brock Badharles A Brahm Barbeau, MD;  Location: [facility];  Service: Obstetrics;  Laterality: N/A;    Family History  Problem Relation Age of Onset  . Other Neg Hx   . Alcohol abuse Neg Hx   . Arthritis Neg Hx   . Asthma Neg Hx   . Birth defects Neg Hx   . Cancer Neg Hx   . COPD Neg Hx   . Depression Neg Hx   . Diabetes Neg Hx   . Drug abuse Neg Hx   . Early death Neg Hx   . Hearing loss Neg Hx   . Heart disease Neg Hx   . Hyperlipidemia Neg Hx   . Hypertension Neg Hx   . Kidney disease Neg Hx   . Learning disabilities Neg Hx   . Mental illness Neg Hx   . Mental retardation Neg Hx   . Miscarriages / Stillbirths Neg Hx   . Stroke Neg Hx   . Vision loss Neg Hx   . Varicose Veins Neg Hx     Social History Social History  Substance Use Topics  . Smoking status: Current Some Day Smoker    Types: Cigars    Last Attempt to Quit: 05/06/2013  . Smokeless tobacco: Never Used     Comment: black and mild occasional  .  Alcohol Use: No    No Known Allergies  Current Outpatient Prescriptions  Medication Sig Dispense Refill  . fluconazole (DIFLUCAN) 150 MG tablet Take 1 tablet (150 mg total) by mouth once. 1 tablet 2  . metroNIDAZOLE (FLAGYL) 500 MG tablet Take 1 tablet (500 mg total) by mouth 2 (two) times daily. 14 tablet 2  . valACYclovir (VALTREX) 1000 MG tablet Take 1 tablet 2 times daily for 3 days prn. 30 tablet prn   No current facility-administered medications for this visit.    Review of Systems Review of Systems Constitutional: negative for fatigue and weight loss Respiratory: negative for cough and wheezing Cardiovascular: negative for chest pain, fatigue and palpitations Gastrointestinal: negative for abdominal pain and change in bowel habits Genitourinary: positive for vaginal discharge with odor and irritation Integument/breast: negative for nipple discharge Musculoskeletal: positive  for myalgias - chronic backache Neurological: negative for gait problems and tremors Behavioral/Psych: negative for abusive relationship, depression Endocrine: negative for temperature intolerance     Blood pressure 124/71, pulse  85, weight 123 lb (55.792 kg), last menstrual period 06/07/2015, unknown if currently breastfeeding.  Physical Exam Physical Exam            General:  Alert and no distress Abdomen:  normal findings: no organomegaly, soft, non-tender and no hernia  Pelvis:  External genitalia: normal general appearance Urinary system: urethral meatus normal and bladder without fullness, nontender Vaginal: normal without tenderness, induration or masses Cervix: normal appearance Adnexa: normal bimanual exam Uterus: anteverted and non-tender, normal size     Data Reviewed Labs  Assessment     Vaginal discharge with irritation and odor.  H/O genital herpes H/O chronic backache    Plan    Wet prep and cultures done. Metronidazole Rx Valtrex Rx Diflucan Rx Referred to  Orthopedics F/U prn  Orders Placed This Encounter  Procedures  . NuSwab Vaginitis Plus (VG+)  . Ambulatory referral to Orthopedics    Referral Priority:  Routine    Referral Type:  Consultation    Number of Visits Requested:  1   Meds ordered this encounter  Medications  . metroNIDAZOLE (FLAGYL) 500 MG tablet    Sig: Take 1 tablet (500 mg total) by mouth 2 (two) times daily.    Dispense:  14 tablet    Refill:  2  . valACYclovir (VALTREX) 1000 MG tablet    Sig: Take 1 tablet 2 times daily for 3 days prn.    Dispense:  30 tablet    Refill:  prn  . fluconazole (DIFLUCAN) 150 MG tablet    Sig: Take 1 tablet (150 mg total) by mouth once.    Dispense:  1 tablet    Refill:  2

## 2015-06-29 ENCOUNTER — Other Ambulatory Visit: Payer: Self-pay | Admitting: Obstetrics

## 2015-06-29 LAB — NUSWAB VAGINITIS PLUS (VG+)
Atopobium vaginae: HIGH Score — AB
BVAB 2: HIGH Score — AB
Candida albicans, NAA: NEGATIVE
Candida glabrata, NAA: NEGATIVE
Chlamydia trachomatis, NAA: NEGATIVE
Megasphaera 1: HIGH Score — AB
Neisseria gonorrhoeae, NAA: NEGATIVE
Trich vag by NAA: NEGATIVE

## 2015-09-08 ENCOUNTER — Emergency Department (HOSPITAL_COMMUNITY)
Admission: EM | Admit: 2015-09-08 | Discharge: 2015-09-08 | Disposition: A | Discharge status: Home / Self Care | Payer: Medicaid Other | Attending: Emergency Medicine | Admitting: Emergency Medicine

## 2015-09-08 ENCOUNTER — Encounter (HOSPITAL_COMMUNITY): Payer: Self-pay | Admitting: Emergency Medicine

## 2015-09-08 DIAGNOSIS — F17210 Nicotine dependence, cigarettes, uncomplicated: Secondary | ICD-10-CM | POA: Insufficient documentation

## 2015-09-08 DIAGNOSIS — H1132 Conjunctival hemorrhage, left eye: Secondary | ICD-10-CM | POA: Insufficient documentation

## 2015-09-08 DIAGNOSIS — H578 Other specified disorders of eye and adnexa: Secondary | ICD-10-CM | POA: Diagnosis present

## 2015-09-08 NOTE — Discharge Instructions (Signed)
Read the information below.  You may return to the Emergency Department at any time for worsening condition or any new symptoms that concern you.   If you develop worsening pain in your eye, change in your vision, swelling around your eye, difficulty moving your eye, or fevers greater than 100.4, see your eye doctor or return to the Emergency Department immediately for a recheck.      Subconjunctival Hemorrhage Subconjunctival hemorrhage is bleeding that happens between the white part of your eye (sclera) and the clear membrane that covers the outside of your eye (conjunctiva). There are many tiny blood vessels near the surface of your eye. A subconjunctival hemorrhage happens when one or more of these vessels breaks and bleeds, causing a red patch to appear on your eye. This is similar to a bruise. Depending on the amount of bleeding, the red patch may only cover a small area of your eye or it may cover the entire visible part of the sclera. If a lot of blood collects under the conjunctiva, there may also be swelling. Subconjunctival hemorrhages do not affect your vision or cause pain, but your eye may feel irritated if there is swelling. Subconjunctival hemorrhages usually do not require treatment, and they disappear on their own within two weeks. CAUSES This condition may be caused by:  Mild trauma, such as rubbing your eye too hard.  Severe trauma or blunt injuries.  Coughing, sneezing, or vomiting.  Straining, such as when lifting a heavy object.  High blood pressure.  Recent eye surgery.  A history of diabetes.  Certain medicines, especially blood thinners (anticoagulants).  Other conditions, such as eye tumors, bleeding disorders, or blood vessel abnormalities. Subconjunctival hemorrhages can happen without an obvious cause.  SYMPTOMS  Symptoms of this condition include:  A bright red or dark red patch on the white part of the eye.  The red area may spread out to cover a larger  area of the eye before it goes away.  The red area may turn brownish-yellow before it goes away.  Swelling.  Mild eye irritation. DIAGNOSIS This condition is diagnosed with a physical exam. If your subconjunctival hemorrhage was caused by trauma, your health care provider may refer you to an eye specialist (ophthalmologist) or another specialist to check for other injuries. You may have other tests, including:  An eye exam.  A blood pressure check.  Blood tests to check for bleeding disorders. If your subconjunctival hemorrhage was caused by trauma, X-rays or a CT scan may be done to check for other injuries. TREATMENT Usually, no treatment is needed. Your health care provider may recommend eye drops or cold compresses to help with discomfort. HOME CARE INSTRUCTIONS  Take over-the-counter and prescription medicines only as directed by your health care provider.  Use eye drops or cold compresses to help with discomfort as directed by your health care provider.  Avoid activities, things, and environments that may irritate or injure your eye.  Keep all follow-up visits as told by your health care provider. This is important. SEEK MEDICAL CARE IF:  You have pain in your eye.  The bleeding does not go away within 3 weeks.  You keep getting new subconjunctival hemorrhages. SEEK IMMEDIATE MEDICAL CARE IF:  Your vision changes or you have difficulty seeing.  You suddenly develop severe sensitivity to light.  You develop a severe headache, persistent vomiting, confusion, or abnormal tiredness (lethargy).  Your eye seems to bulge or protrude from your eye socket.  You develop  unexplained bruises on your body.  You have unexplained bleeding in another area of your body.   This information is not intended to replace advice given to you by your health care provider. Make sure you discuss any questions you have with your health care provider.   Document Released: 03/25/2005  Document Revised: 12/14/2014 Document Reviewed: 06/01/2014 Elsevier Interactive Patient Education 2016 [employer].   [employer] Assistance The United Ways 211 is a great source of information about community services available.  Access by dialing 2-1-1 from anywhere in [place], or by website -  PooledIncome.pl.   Other Local Resources (Updated 04/2015)  Financial Assistance   Services    Phone Number and Address  [facility]  Low-cost medical care - 1st and 3rd Saturday of every month  Must not qualify for public or private insurance and must have limited income [phone] [address] [place], [place]    [place] [employer] of Social Services  Child care  Emergency assistance for housing and [employer]  Medicaid [phone] [address] [place], [place] [address]   [facility] Department  Low-cost medical care for children, communicable diseases, sexually-transmitted diseases, immunizations, maternity care, womens health and family planning [phone] [address] [place], [place] [address]  [facility]   Medication assistance for [facility] residents  Must meet income requirements [phone] [address] [place], [place].    [facility] Social Services  Child care  Emergency assistance for housing and [employer]  Medicaid [phone] [address] [place], [place] [address]  [facility]   Low-cost medical care,   Monday through Friday, 9 am to 6 pm.   Accepts Medicare/Medicaid, and self-pay [phone] [address] [place], [place] [address]  [facility] for Children  Low-cost medical care - Monday through Friday, 8:30 am - 5:30 pm  Accepts Medicaid and self-pay [phone] [address] [place], [place] [address]   [facility]  Primary medical care, including for those with sickle cell disease  Accepts Medicare, Medicaid, insurance and self-pay [phone] [address] [place], [place]  [facility]   Primary medical care  Accepts Medicare, [place], insurance and self-pay [phone] [address] [place], [place] [address]   [facility] Department of Social Services  Child care  Emergency assistance for housing and [employer]  Medicaid [phone] [address] [place], [place] [address]  [facility] Department of Health and [employer]  Child care  Emergency assistance for housing and [employer]  Medicaid [phone] [address] [place], [place] [address]   [facility] Medication Assistance Program  Medication assistance for [facility] residents with no insurance only  Must have a primary care doctor [phone] [address] [place], [place]  [facility]   Primary medical care  [place], [place], insurance  [phone] [address] [place], [place]  [facility]   Medication assistance [id number]  [facility]   Primary medical care  Accepts Medicare, [place], insurance and self-pay [phone] [address] [place], [place] [address]  [facility]   Primary medical care  Accepts Medicare, [place], insurance and self-pay [phone] [address] [place], [place] [address]  [facility]  For [place] residents between the ages of  18 and 64 who do not have any form of health insurance, Medicare, Medicaid, or VA benefits.  Services are provided free of charge to uninsured patients who fall within federal poverty guidelines.    Hours: Tuesdays and Thursdays, 4:15 - 8 pm [phone] [address], [address] [place], [place] [address]  [facility]     Primary medical care  Dental care  Nutritional counseling  Pharmacy  Accepts Medicaid, Medicare, most insurance.  Fees are adjusted based on ability to pay.   [phone] [facility] [address] [place], [place]  [phone] [facility] [address] [place], [place]  [phone] 501 [address]11 [facility] [place], [place]  [phone] [facility], [address] [place], [place]  [phone] [facility] [address] [place], [place]  [facility]  Womens health and family planning [phone] [address] [place], [place]  [facility] Department of Social Services  Child care  Emergency assistance for housing and [employer]arkutilities  Food stamps  Medicaid [phone] [address], [place], [place] [address]   [facility]    Ages 6018 and older  Hours: Mondays and Thursdays, 7:00 am - 9:00 am Patients are seen on a first come, first served basis. [phone], [phone]. [phone] [place], [place]  [facility] of Social Services  Child care  Emergency assistance for housing and [employer]ly[employer]arkutilities  Food stamps  Medicaid [phone] [address] [place], [place] [address]  [facility] [employer] [facility]  Medication assistance  Rental assistance  Food pantry  Medication assistance  Housing assistance  Emergency food distribution  Utility assistance [phone] [address] [place], [place]  [phone]  [address] [place], [place] [address] Hours: Tuesdays and Thursdays from 9am - 12 noon by appointment only  [phone] [address] [place], [place] [address]  [facility] - Lanae Boastlara F. Gunn   Accepts private insurance, [place]are, and [place].  Payment is based on a sliding scale for those without insurance.  Hours: Mondays, Tuesdays and  Thursdays, 8:30 am - 5:30 pm.   [phone] [address] [place], [place]  [facility] - [facility] Medicine at [facility]    Accepts private insurance, [place], and [place].  Payment is based on a sliding scale for those without insurance. [phone] [address] [place], [place]  [facility] - Pediatrics at E. Scientist, research (physical sciences)[place]  Accepts private insurance, [employer] EntertainmentMedicare, and [place].  Payment is based on a sliding scale for those without insurance [phone] [address], [place], [place]  [facility] - [facility]s at [facility]  Accepts private insurance, Coleman[place]icare, and [place].  Payment is based on a sliding scale for those without insurance. [phone] [address] [place], [place]  [facility] - [facility] at [facility]  Accepts private insurance, [place], and [place].  Payment is based on a sliding scale for those without insurance. [phone], ext. [phone] [place], [place].    [facility]  Maternity care.  Accepts Medicaid and self-pay. [phone] [address] [place], [place]

## 2015-09-08 NOTE — ED Notes (Signed)
Patient states that over past couple days been having blood vessel left eye starting leaking blood that got worse this morning. Patient denies pain, injury or any visual disturbances.

## 2015-09-08 NOTE — ED Provider Notes (Signed)
CSN: [id number]     Arrival date & time 09/08/15  1023 History  By signing my name below, I, Emily Morse, attest that this documentation has been prepared under the direction and in the presence of Tameca Jerez, PA-C. Electronically Signed: Angelene GiovanniEmmanuella Morse, ED Scribe. 09/08/2015. 12:19 PM.    Chief Complaint  Patient presents with  . busted blood vessel in eye    The history is provided by the patient. No language interpreter was used.    HPI Comments: Emily Morse is a 30 y.o. female who presents to the Emergency Department complaining of gradually spreading left eye redness/discoloration onset 3 days ago. She explains that she noticed a small red "dot" in her left eye 3 days ago and she touched the area and the next day, she notes that it started spreading. She adds that she has had intermittent non-productive cough recently. Pt denies any eye pain, burning, itchiness, drainage, or any visual disturbances. She also denies any recent eye trauma. Pt does not wear any eye contacts. No alleviating factors noted. Pt has not tried any medications PTA. No SOB, CP, vomiting, fever, or chills.     Past Medical History  Diagnosis Date  . Herpes     last out break 2008  . Thrombocytopenia (HCC)   . Anemia   . Hx of gastroesophageal reflux (GERD)    Past Surgical History  Procedure Laterality Date  . Dilation and curettage of uterus    . Cesarean section  10/22/2011    Procedure: CESAREAN SECTION;  Surgeon: Brock Badharles A [name], MD;  Location: [facility];  Service: Gynecology;  Laterality: N/A;  . Cesarean section N/A 12/07/2013    Procedure: CESAREAN SECTION;  Surgeon: Brock Badharles A [name], MD;  Location: [facility];  Service: Obstetrics;  Laterality: N/A;  . Cesarean section N/A 12/05/2014    Procedure: CESAREAN SECTION;  Surgeon: Brock Badharles A [name], MD;  Location: [facility];  Service: Obstetrics;  Laterality: N/A;   Family History  Problem Relation Age of Onset  . Other Neg Hx   . Alcohol abuse Neg Hx   .  Arthritis Neg Hx   . Asthma Neg Hx   . Birth defects Neg Hx   . Cancer Neg Hx   . COPD Neg Hx   . Depression Neg Hx   . Diabetes Neg Hx   . Drug abuse Neg Hx   . Early death Neg Hx   . Hearing loss Neg Hx   . Heart disease Neg Hx   . Hyperlipidemia Neg Hx   . Hypertension Neg Hx   . Kidney disease Neg Hx   . Learning disabilities Neg Hx   . Mental illness Neg Hx   . Mental retardation Neg Hx   . Miscarriages / Stillbirths Neg Hx   . Stroke Neg Hx   . Vision loss Neg Hx   . Varicose Veins Neg Hx    Social History  Substance Use Topics  . Smoking status: Current Some Day Smoker    Types: Cigars    Last Attempt to Quit: 05/06/2013  . Smokeless tobacco: Never Used     Comment: black and mild occasional  . Alcohol Use: No   OB History    Gravida Para Term Preterm AB TAB SAB Ectopic Multiple Living   5 3 3  0 2  2  0 3     Review of Systems  Constitutional: Negative for fever and chills.  Eyes: Positive for redness. Negative for photophobia, pain,  discharge, itching and visual disturbance.  Respiratory: Positive for cough. Negative for shortness of breath.   Cardiovascular: Negative for chest pain.  Gastrointestinal: Negative for vomiting.  Musculoskeletal: Negative for myalgias and neck stiffness.  Skin: Negative for color change, rash and wound.  Allergic/Immunologic: Negative for immunocompromised state.  Hematological: Does not bruise/bleed easily.  Psychiatric/Behavioral: Negative for self-injury.      Allergies  Review of patient's allergies indicates no known allergies.  Home Medications   Prior to Admission medications   Medication Sig Start Date End Date Taking? Authorizing Provider  fluconazole (DIFLUCAN) 150 MG tablet Take 1 tablet (150 mg total) by mouth once. 06/27/15   Brock Bad, MD  metroNIDAZOLE (FLAGYL) 500 MG tablet Take 1 tablet (500 mg total) by mouth 2 (two) times daily. 06/27/15   Brock Bad, MD  valACYclovir (VALTREX) 1000 MG  tablet Take 1 tablet 2 times daily for 3 days prn. 06/27/15   Brock Bad, MD   BP 119/82 mmHg  Pulse 63  Temp(Src) 99.1 F (37.3 C) (Oral)  Resp 14  SpO2 100%  LMP 08/08/2015 Physical Exam  Constitutional: She appears well-developed and well-nourished. No distress.  HENT:  Head: Normocephalic and atraumatic.  Eyes: EOM and lids are normal. Pupils are equal, round, and reactive to light. Right eye exhibits no discharge. Left eye exhibits no discharge. Right conjunctiva is not injected. Right conjunctiva has no hemorrhage. Left conjunctiva is not injected. Left conjunctiva has a hemorrhage.  Left conjunctival hemorrhage  Limited funduscopic exam normal, no foreign body No lid edema or erythema noted  Neck: Neck supple.  Pulmonary/Chest: Effort normal.  Neurological: She is alert.  Skin: She is not diaphoretic.  Nursing note and vitals reviewed.   ED Course  Procedures (including critical care time) DIAGNOSTIC STUDIES: Oxygen Saturation is 100% on RA, normal by my interpretation.    COORDINATION OF CARE: 12:16 PM- Pt advised of plan for treatment and pt agrees. Recommended to return if pt develops visual disturbances or eye pain.   MDM   Final diagnoses:  Subconjunctival hemorrhage of left eye    Afebrile, nontoxic patient with atraumatic redness in the left eye without any symptoms including pain/burning, visual change, discharge, etc.  The eye did not "leak blood" as indicated in the triage - there is a subconjunctival hemorrhage around 4:00, there has been no drainage/discharge/bleeding from the eye. Likely from coughing.  No e/o infection, doubt corneal abrasion, foreign body.  Doubt glaucoma, cellulitis.   D/C home with reassurance, information, return precautions, PCP follow up.  Discussed result, findings, treatment, and follow up  with patient.  Pt given return precautions.  Pt verbalizes understanding and agrees with plan.        I personally performed the services  described in this documentation, which was scribed in my presence. The recorded information has been reviewed and is accurate.   Trixie Dredge, PA-C 09/08/15 1325  Gwyneth Sprout, MD 09/09/15 [phone]

## 2015-09-08 NOTE — ED Notes (Signed)
Bed: WA27 Expected date:  Expected time:  Means of arrival:  Comments: 

## 2015-11-09 ENCOUNTER — Ambulatory Visit: Payer: Medicaid Other | Admitting: Obstetrics

## 2015-11-23 ENCOUNTER — Ambulatory Visit: Payer: Self-pay | Admitting: Obstetrics

## 2015-12-25 ENCOUNTER — Encounter (HOSPITAL_COMMUNITY): Payer: Self-pay | Admitting: Emergency Medicine

## 2015-12-25 ENCOUNTER — Telehealth (HOSPITAL_BASED_OUTPATIENT_CLINIC_OR_DEPARTMENT_OTHER): Payer: Self-pay | Admitting: Emergency Medicine

## 2015-12-25 ENCOUNTER — Emergency Department (HOSPITAL_COMMUNITY)
Admission: EM | Admit: 2015-12-25 | Discharge: 2015-12-25 | Disposition: A | Discharge status: Home / Self Care | Payer: Medicaid Other | Attending: Emergency Medicine | Admitting: Emergency Medicine

## 2015-12-25 ENCOUNTER — Emergency Department (HOSPITAL_COMMUNITY): Payer: Medicaid Other

## 2015-12-25 DIAGNOSIS — M25562 Pain in left knee: Secondary | ICD-10-CM

## 2015-12-25 DIAGNOSIS — Z87891 Personal history of nicotine dependence: Secondary | ICD-10-CM | POA: Insufficient documentation

## 2015-12-25 DIAGNOSIS — Z79899 Other long term (current) drug therapy: Secondary | ICD-10-CM | POA: Insufficient documentation

## 2015-12-25 MED ORDER — ACETAMINOPHEN 325 MG PO TABS
650.0000 mg | ORAL_TABLET | Freq: Once | ORAL | Status: AC
  Administered 2015-12-25: 650 mg via ORAL
  Filled 2015-12-25: qty 2

## 2015-12-25 MED ORDER — ACETAMINOPHEN 500 MG PO TABS: 500.0000 mg | ORAL_TABLET | Freq: Four times a day (QID) | ORAL | 0 refills | Status: DC | PRN

## 2015-12-25 NOTE — ED Notes (Signed)
Patient is A & O.  She has no questions regarding discharge.

## 2015-12-25 NOTE — Discharge Instructions (Signed)
Use knee sleeve as instructed. Apply ice to affected area. Take tylenol as needed for pain. Follow up with orthopedics if symptoms do not improve. Return to the ED if you experience severe worsening of your pain, increased redness or swelling of joint, fevers, chest pain or difficulty breathing.

## 2015-12-25 NOTE — ED Provider Notes (Signed)
[facility]-EMERGENCY DEPT Provider Note   CSN: [id number] Arrival date & time: 12/25/15  0840     History   Chief Complaint Chief Complaint  Patient presents with  . Knee Pain    Left    HPI Emily Morse is a 30 y.o. female with a past medical history of thrombocytopenia who presents to the ED today complaining of knee pain. Patient states that over the weekend she celebrated her 22th birthday and drank too much alcohol. She states she fell down landing on her left knee and has had pain in her knee ever since. She states pain is worsened with ambulation. She has tried taking over-the-counter Tylenol without relief for symptoms. Patient also endorses diffuse myalgias. No redness or swelling of the knee. No fevers or chills.  HPI  Past Medical History:  Diagnosis Date  . Anemia   . 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 Hx of gastroesophageal reflux (GERD)   . Thrombocytopenia [facility])     Patient Active Problem List   Diagnosis Date Noted  . Gestational thrombocytopenia without hemorrhage in third trimester (HCC)   . SGA (small for gestational age)   . [redacted] weeks gestation of pregnancy   . Anemia affecting pregnancy in third trimester, antepartum 10/12/2014  . Hereditary disease in family possibly affecting fetus, affecting management of mother, antepartum condition or complication   . Encounter for fetal anatomic survey   . [redacted] weeks gestation of pregnancy   . Thrombocytopenia (HCC) 02/18/2014  . Pain aggravated by activities of daily living 02/18/2014  . Anemia, postpartum 12/09/2013  . Active labor 12/07/2013  . S/P cesarean section 12/07/2013  . Thrombocytopenia complicating pregnancy (HCC) 11/18/2013  . Unspecified high-risk pregnancy 08/04/2013  . H/O thrombocytopenia 08/04/2013  . GERD without esophagitis 08/04/2013    Past Surgical History:  Procedure Laterality Date  . CESAREAN SECTION  10/22/2011   Procedure: CESAREAN SECTION;  Surgeon: Brock Bad, MD;  Location:  [facility];  Service: Gynecology;  Laterality: N/A;  . CESAREAN SECTION N/A 12/07/2013   Procedure: CESAREAN SECTION;  Surgeon: Brock Bad, MD;  Location: [facility];  Service: Obstetrics;  Laterality: N/A;  . CESAREAN SECTION N/A 12/05/2014   Procedure: CESAREAN SECTION;  Surgeon: Brock Bad, MD;  Location: [facility];  Service: Obstetrics;  Laterality: N/A;  . DILATION AND CURETTAGE OF UTERUS      OB History    Gravida Para Term Preterm AB Living   5 3 3  0 2 3   SAB TAB Ectopic Multiple Live Births   2     0 3       Home Medications    Prior to Admission medications   Medication Sig Start Date End Date Taking? Authorizing Provider  fluconazole (DIFLUCAN) 150 MG tablet Take 1 tablet (150 mg total) by mouth once. 06/27/15   Brock Bad, MD  metroNIDAZOLE (FLAGYL) 500 MG tablet Take 1 tablet (500 mg total) by mouth 2 (two) times daily. 06/27/15   Brock Bad, MD  valACYclovir (VALTREX) 1000 MG tablet Take 1 tablet 2 times daily for 3 days prn. 06/27/15   Brock Bad, MD    Family History Family History  Problem Relation Age of Onset  . Other Neg Hx   . Alcohol abuse Neg Hx   . Arthritis Neg Hx   . Asthma Neg Hx   . Birth defects Neg Hx   . Cancer Neg Hx   . COPD Neg  Hx   . Depression Neg Hx   . Diabetes Neg Hx   . Drug abuse Neg Hx   . Early death Neg Hx   . Hearing loss Neg Hx   . Heart disease Neg Hx   . Hyperlipidemia Neg Hx   . Hypertension Neg Hx   . Kidney disease Neg Hx   . Learning disabilities Neg Hx   . Mental illness Neg Hx   . Mental retardation Neg Hx   . Miscarriages / Stillbirths Neg Hx   . Stroke Neg Hx   . Vision loss Neg Hx   . Varicose Veins Neg Hx     Social History Social History  Substance Use Topics  . Smoking status: Former Smoker    Types: Cigars    Quit date: 05/06/2013  . Smokeless tobacco: Never Used     Comment: black and mild occasional  . Alcohol use 0.0 oz/week     Comment: Socially     Allergies   Review of  patient's allergies indicates no known allergies.   Review of Systems Review of Systems  All other systems reviewed and are negative.    Physical Exam Updated Vital Signs BP 132/92 (BP Location: Right Arm)   Pulse 69   Temp 98.1 F (36.7 C) (Oral)   Ht 5\' 4"  (1.626 m)   Wt 54.9 kg   LMP 12/08/2015   SpO2 100%   BMI 20.77 kg/m   Physical Exam  Constitutional: She is oriented to person, place, and time. She appears well-developed and well-nourished. No distress.  HENT:  Head: Normocephalic and atraumatic.  Eyes: Conjunctivae are normal. Right eye exhibits no discharge. Left eye exhibits no discharge. No scleral icterus.  Cardiovascular: Normal rate and intact distal pulses.   Pulmonary/Chest: Effort normal.  Musculoskeletal: Normal range of motion. She exhibits tenderness. She exhibits no edema or deformity.  L knee: TTp over anterior aspect of left knee. Negative anterior/poster drawer bilaterally. Negative ballottement test. No varus or valgus laxity. No crepitus. No pain with flexion or extension. No overlying erythema or warmth   Neurological: She is alert and oriented to person, place, and time. Coordination normal.  Skin: Skin is warm and dry. No rash noted. She is not diaphoretic. No erythema. No pallor.  Psychiatric: She has a normal mood and affect. Her behavior is normal.  Nursing note and vitals reviewed.    ED Treatments / Results  Labs (all labs ordered are listed, but only abnormal results are displayed) Labs Reviewed - No data to display  EKG  EKG Interpretation None       Radiology Dg Knee Complete 4 Views Left  Result Date: 12/25/2015 CLINICAL DATA:  Recent fall, knee pain EXAM: LEFT KNEE - COMPLETE 4+ VIEW COMPARISON:  None. FINDINGS: The left knee joint spaces appear normal. No fracture is seen. No joint effusion is noted. IMPRESSION: Negative. Electronically Signed   By: Dwyane DeePaul  Barry M.D.   On: 12/25/2015 09:42    Procedures Procedures  (including critical care time)  Medications Ordered in ED Medications  acetaminophen (TYLENOL) tablet 650 mg (not administered)     Initial Impression / Assessment and Plan / ED Course  I have reviewed the triage vital signs and the nursing notes.  Pertinent labs & imaging results that were available during my care of the patient were reviewed by me and considered in my medical decision making (see chart for details).  Clinical Course    Patient X-Ray negative for obvious fracture  or dislocation. Pain managed in ED. Pt advised to follow up with orthopedics if symptoms persist for possibility of missed fracture diagnosis. Patient given knee sleeve while in ED, conservative therapy recommended and discussed. Patient will be dc home & is agreeable with above plan.   Final Clinical Impressions(s) / ED Diagnoses   Final diagnoses:  Left knee pain    New Prescriptions New Prescriptions   No medications on file     Dub Mikes, PA-C 12/25/15 1707    Azalia Bilis, MD 12/26/15 [phone]

## 2015-12-25 NOTE — ED Triage Notes (Signed)
Pt presents with left knee pain & states difficulty and pain when walking or bending the knee.  She had a fall over the weekend d/t being intoxicated and woke up with the pain on Sunday. Pt took tylenol with no relief.

## 2016-02-06 ENCOUNTER — Encounter: Payer: Self-pay | Admitting: Obstetrics

## 2016-02-06 ENCOUNTER — Ambulatory Visit (INDEPENDENT_AMBULATORY_CARE_PROVIDER_SITE_OTHER): Payer: Medicaid Other | Admitting: Obstetrics

## 2016-02-06 VITALS — BP 136/92 | HR 69 | Ht 64.0 in | Wt 120.2 lb

## 2016-02-06 DIAGNOSIS — N939 Abnormal uterine and vaginal bleeding, unspecified: Secondary | ICD-10-CM | POA: Diagnosis not present

## 2016-02-06 DIAGNOSIS — Z124 Encounter for screening for malignant neoplasm of cervix: Secondary | ICD-10-CM | POA: Diagnosis not present

## 2016-02-06 DIAGNOSIS — Z01419 Encounter for gynecological examination (general) (routine) without abnormal findings: Secondary | ICD-10-CM

## 2016-02-06 DIAGNOSIS — I10 Essential (primary) hypertension: Secondary | ICD-10-CM

## 2016-02-06 LAB — POCT URINALYSIS DIPSTICK
Bilirubin, UA: NEGATIVE
Blood, UA: 50
Glucose, UA: NEGATIVE
Ketones, UA: NEGATIVE
Nitrite, UA: NEGATIVE
Protein, UA: NEGATIVE
Spec Grav, UA: <=1.005
Urobilinogen, UA: NEGATIVE
pH, UA: 5

## 2016-02-06 MED ORDER — PRENATAL PLUS 27-1 MG PO TABS: 1.0000 | ORAL_TABLET | Freq: Every day | ORAL | 0 refills | Status: DC

## 2016-02-06 MED ORDER — TRIAMTERENE-HCTZ 37.5-25 MG PO CAPS: 1.0000 | ORAL_CAPSULE | Freq: Every day | ORAL | 11 refills | Status: DC

## 2016-02-06 NOTE — Progress Notes (Signed)
Subjective:        Emily Morse is a [age] y.o. female here for a routine exam.  Current complaints: Period heavy this month.  Periods have been normal prior to this last period.  She has had tubal sterilization with her last C/S.  Personal health questionnaire:  Is patient Ashkenazi Jewish, have a family history of breast and/or ovarian cancer: no Is there a family history of uterine cancer diagnosed at age < 5650, gastrointestinal cancer, urinary tract cancer, family member who is a Personnel officerLynch syndrome-associated carrier: no Is the patient overweight and hypertensive, family history of diabetes, personal history of gestational diabetes, preeclampsia or PCOS: no Is patient over 1955, have PCOS,  family history of premature CHD under age 28, diabetes, smoke, have hypertension or peripheral artery disease:  no At any time, has a partner hit, kicked or otherwise hurt or frightened you?: no Over the past 2 weeks, have you felt down, depressed or hopeless?: no Over the past 2 weeks, have you felt little interest or pleasure in doing things?:no   Gynecologic History Patient's last menstrual period was 01/30/2016 (exact date). Contraception: tubal ligation Last Pap: 2016. Results were: normal Last mammogram: n/a. Results were: n/a  Obstetric History OB History  Gravida Para Term Preterm AB Living  5 3 3  0 2 3  SAB TAB Ectopic Multiple Live Births  2     0 3    # Outcome Date GA Lbr Len/2nd Weight Sex Delivery Anes PTL Lv  5 Term 12/05/14 2382w2d  7 lb 13.8 oz (3.566 kg) M CS-LTranv Gen  LIV  4 Term 12/07/13 8264w2d  8 lb 0.6 oz (3.646 kg) M CS-LTranv Gen  LIV  3 Term 10/22/11 5828w2d  7 lb 9.9 oz (3.455 kg) F CS-LTranv EPI  LIV  2 SAB 2010 5725w0d            Birth Comments: neural tube defect iufd  1 SAB 2008 3023w0d             Past Medical History:  Diagnosis Date  . Anemia   . 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 Hx of gastroesophageal reflux (GERD)   . Thrombocytopenia (HCC)     Past  Surgical History:  Procedure Laterality Date  . CESAREAN SECTION  10/22/2011   Procedure: CESAREAN SECTION;  Surgeon: Brock Badharles A Claxton Levitz, MD;  Location: [facility];  Service: Gynecology;  Laterality: N/A;  . CESAREAN SECTION N/A 12/07/2013   Procedure: CESAREAN SECTION;  Surgeon: Brock Badharles A Hindy Perrault, MD;  Location: [facility];  Service: Obstetrics;  Laterality: N/A;  . CESAREAN SECTION N/A 12/05/2014   Procedure: CESAREAN SECTION;  Surgeon: Brock Badharles A Joleen Stuckert, MD;  Location: [facility];  Service: Obstetrics;  Laterality: N/A;  . DILATION AND CURETTAGE OF UTERUS       Current Outpatient Prescriptions:  .  acetaminophen (TYLENOL) 500 MG tablet, Take 1 tablet (500 mg total) by mouth every 6 (six) hours as needed., Disp: 30 tablet, Rfl: 0 .  valACYclovir (VALTREX) 1000 MG tablet, Take 1 tablet 2 times daily for 3 days prn., Disp: 30 tablet, Rfl: prn No Known Allergies  Social History  Substance Use Topics  . Smoking status: Former Smoker    Types: Cigars    Quit date: 05/06/2013  . Smokeless tobacco: Never Used     Comment: black and mild occasional  . Alcohol use 0.0 oz/week     Comment: Socially    Family History  Problem Relation  Age of Onset  . Other Neg Hx   . Alcohol abuse Neg Hx   . Arthritis Neg Hx   . Asthma Neg Hx   . Birth defects Neg Hx   . Cancer Neg Hx   . COPD Neg Hx   . Depression Neg Hx   . Diabetes Neg Hx   . Drug abuse Neg Hx   . Early death Neg Hx   . Hearing loss Neg Hx   . Heart disease Neg Hx   . Hyperlipidemia Neg Hx   . Hypertension Neg Hx   . Kidney disease Neg Hx   . Learning disabilities Neg Hx   . Mental illness Neg Hx   . Mental retardation Neg Hx   . Miscarriages / Stillbirths Neg Hx   . Stroke Neg Hx   . Vision loss Neg Hx   . Varicose Veins Neg Hx       Review of Systems  Constitutional: negative for fatigue and weight loss Respiratory: negative for cough and wheezing Cardiovascular: negative for chest pain, fatigue and palpitations Gastrointestinal:  negative for abdominal pain and change in bowel habits Musculoskeletal:negative for myalgias Neurological: negative for gait problems and tremors Behavioral/Psych: negative for abusive relationship, depression Endocrine: negative for temperature intolerance   Genitourinary:positive for abnormal menstrual periods Integument/breast: negative for breast lump, breast tenderness, nipple discharge and skin lesion(s)    Objective:       BP (!) 136/92   Pulse 69   Ht 5\' 4"  (1.626 m)   Wt 120 lb 3.2 oz (54.5 kg)   LMP 01/30/2016 (Exact Date)   Breastfeeding? No   BMI 20.63 kg/m  General:   alert  Skin:   no rash or abnormalities  Lungs:   clear to auscultation bilaterally  Heart:   regular rate and rhythm, S1, S2 normal, no murmur, click, rub or gallop  Breasts:   normal without suspicious masses, skin or nipple changes or axillary nodes  Abdomen:  normal findings: no organomegaly, soft, non-tender and no hernia  Pelvis:  External genitalia: normal general appearance Urinary system: urethral meatus normal and bladder without fullness, nontender Vaginal: normal without tenderness, induration or masses.  Menstrual blood in vault Cervix: normal appearance Adnexa: normal bimanual exam Uterus: anteverted and non-tender, normal size   Lab Review Urine pregnancy test Labs reviewed yes Radiologic studies reviewed no  50% of 20 min visit spent on counseling and coordination of care.   Assessment:    Healthy female exam.    Episode of AUB this past month   Plan:    Labs:  General health Panel ordered   Education reviewed: calcium supplements, low fat, low cholesterol diet, safe sex/STD prevention, self breast exams and weight bearing exercise. Contraception: tubal ligation. Follow up in: 1 year.   No orders of the defined types were placed in this encounter.  No orders of the defined types were placed in this encounter.     Patient ID: Emily Morse, female   DOB:  1985/07/07, 30 y.o.   MRN: [id number]

## 2016-02-07 ENCOUNTER — Other Ambulatory Visit: Payer: Self-pay | Admitting: Obstetrics

## 2016-02-07 DIAGNOSIS — D696 Thrombocytopenia, unspecified: Secondary | ICD-10-CM

## 2016-02-07 LAB — COMPREHENSIVE METABOLIC PANEL
ALT: 13 IU/L (ref 0–32)
AST: 18 IU/L (ref 0–40)
Albumin/Globulin Ratio: 1.6 (ref 1.2–2.2)
Albumin: 4.6 g/dL (ref 3.5–5.5)
Alkaline Phosphatase: 69 IU/L (ref 39–117)
BUN/Creatinine Ratio: 14 (ref 9–23)
BUN: 9 mg/dL (ref 6–20)
Bilirubin Total: <0.2 mg/dL (ref 0.0–1.2)
CO2: 25 mmol/L (ref 18–29)
Calcium: 9.4 mg/dL (ref 8.7–10.2)
Chloride: 100 mmol/L (ref 96–106)
Creatinine, Ser: 0.63 mg/dL (ref 0.57–1.00)
GFR calc Af Amer: 141 mL/min/{1.73_m2} (ref >59)
GFR calc non Af Amer: 122 mL/min/{1.73_m2} (ref >59)
Globulin, Total: 2.9 g/dL (ref 1.5–4.5)
Glucose: 78 mg/dL (ref 65–99)
Potassium: 3.8 mmol/L (ref 3.5–5.2)
Sodium: 140 mmol/L (ref 134–144)
Total Protein: 7.5 g/dL (ref 6.0–8.5)

## 2016-02-07 LAB — CBC
Hematocrit: 37.1 % (ref 34.0–46.6)
Hemoglobin: 11.9 g/dL (ref 11.1–15.9)
MCH: 27.4 pg (ref 26.6–33.0)
MCHC: 32.1 g/dL (ref 31.5–35.7)
MCV: 85 fL (ref 79–97)
Platelets: 114 10*3/uL — ABNORMAL LOW (ref 150–379)
RBC: 4.35 x10E6/uL (ref 3.77–5.28)
RDW: 15.4 % (ref 12.3–15.4)
WBC: 6.6 10*3/uL (ref 3.4–10.8)

## 2016-02-07 LAB — TSH: TSH: 1.73 u[IU]/mL (ref 0.450–4.500)

## 2016-02-08 LAB — URINE CULTURE

## 2016-02-09 ENCOUNTER — Telehealth: Payer: Self-pay | Admitting: *Deleted

## 2016-02-09 ENCOUNTER — Other Ambulatory Visit: Payer: Self-pay | Admitting: Obstetrics

## 2016-02-09 DIAGNOSIS — N390 Urinary tract infection, site not specified: Secondary | ICD-10-CM

## 2016-02-09 DIAGNOSIS — R319 Hematuria, unspecified: Principal | ICD-10-CM

## 2016-02-09 MED ORDER — AMOXICILLIN-POT CLAVULANATE 875-125 MG PO TABS: 1.0000 | ORAL_TABLET | Freq: Two times a day (BID) | ORAL | 0 refills | Status: DC

## 2016-02-09 NOTE — Telephone Encounter (Signed)
Pt made aware of lab results, normal thyroid test.  Pt advised of platelet count and to see hematology as ordered by Dr Clearance CootsHarper. Pt states understanding.

## 2016-02-12 ENCOUNTER — Other Ambulatory Visit: Payer: Self-pay | Admitting: Obstetrics

## 2016-02-12 DIAGNOSIS — A5901 Trichomonal vulvovaginitis: Secondary | ICD-10-CM

## 2016-02-12 LAB — CYTOLOGY - PAP: Diagnosis: NEGATIVE

## 2016-02-12 MED ORDER — METRONIDAZOLE 500 MG PO TABS: 2000.0000 mg | ORAL_TABLET | Freq: Once | ORAL | 0 refills | Status: AC

## 2016-02-13 ENCOUNTER — Other Ambulatory Visit: Payer: Self-pay | Admitting: Obstetrics

## 2016-02-13 DIAGNOSIS — B3731 Acute candidiasis of vulva and vagina: Secondary | ICD-10-CM

## 2016-02-13 DIAGNOSIS — B373 Candidiasis of vulva and vagina: Secondary | ICD-10-CM

## 2016-02-13 DIAGNOSIS — B9689 Other specified bacterial agents as the cause of diseases classified elsewhere: Secondary | ICD-10-CM

## 2016-02-13 DIAGNOSIS — N760 Acute vaginitis: Secondary | ICD-10-CM

## 2016-02-13 DIAGNOSIS — A5901 Trichomonal vulvovaginitis: Secondary | ICD-10-CM

## 2016-02-13 LAB — NUSWAB VG+, CANDIDA 6SP
Atopobium vaginae: HIGH Score — AB
Candida albicans, NAA: NEGATIVE
Candida glabrata, NAA: NEGATIVE
Candida krusei, NAA: NEGATIVE
Candida lusitaniae, NAA: NEGATIVE
Candida parapsilosis, NAA: NEGATIVE
Candida tropicalis, NAA: NEGATIVE
Chlamydia trachomatis, NAA: NEGATIVE
Megasphaera 1: HIGH Score — AB
Neisseria gonorrhoeae, NAA: NEGATIVE
Trich vag by NAA: POSITIVE — AB

## 2016-02-13 MED ORDER — FLUCONAZOLE 150 MG PO TABS: 150.0000 mg | ORAL_TABLET | Freq: Once | ORAL | 2 refills | Status: AC

## 2016-02-13 MED ORDER — CLINDAMYCIN HCL 300 MG PO CAPS: 300.0000 mg | ORAL_CAPSULE | Freq: Three times a day (TID) | ORAL | 0 refills | Status: DC

## 2016-02-13 MED ORDER — METRONIDAZOLE 500 MG PO TABS: 2000.0000 mg | ORAL_TABLET | Freq: Once | ORAL | 0 refills | Status: AC

## 2016-02-20 ENCOUNTER — Encounter: Payer: Self-pay | Admitting: Hematology

## 2016-03-12 ENCOUNTER — Other Ambulatory Visit: Payer: Self-pay | Admitting: Obstetrics

## 2016-03-12 DIAGNOSIS — Z01419 Encounter for gynecological examination (general) (routine) without abnormal findings: Secondary | ICD-10-CM

## 2016-04-10 ENCOUNTER — Encounter: Payer: Self-pay | Admitting: Hematology

## 2016-04-10 ENCOUNTER — Ambulatory Visit (HOSPITAL_BASED_OUTPATIENT_CLINIC_OR_DEPARTMENT_OTHER): Payer: Medicaid Other | Admitting: Hematology

## 2016-04-10 VITALS — BP 127/75 | HR 80 | Temp 98.4°F | Resp 16 | Ht 64.0 in | Wt 123.6 lb

## 2016-04-10 DIAGNOSIS — E611 Iron deficiency: Secondary | ICD-10-CM

## 2016-04-10 DIAGNOSIS — D696 Thrombocytopenia, unspecified: Secondary | ICD-10-CM | POA: Diagnosis not present

## 2016-04-11 ENCOUNTER — Telehealth: Payer: Self-pay | Admitting: Hematology and Oncology

## 2016-04-11 ENCOUNTER — Ambulatory Visit (HOSPITAL_BASED_OUTPATIENT_CLINIC_OR_DEPARTMENT_OTHER): Payer: Medicaid Other | Admitting: Hematology and Oncology

## 2016-04-11 ENCOUNTER — Ambulatory Visit (HOSPITAL_BASED_OUTPATIENT_CLINIC_OR_DEPARTMENT_OTHER): Payer: Medicaid Other

## 2016-04-11 VITALS — BP 119/82 | HR 73 | Temp 98.8°F | Resp 18 | Ht 64.0 in | Wt 124.0 lb

## 2016-04-11 DIAGNOSIS — K089 Disorder of teeth and supporting structures, unspecified: Secondary | ICD-10-CM

## 2016-04-11 DIAGNOSIS — N920 Excessive and frequent menstruation with regular cycle: Secondary | ICD-10-CM

## 2016-04-11 DIAGNOSIS — R5383 Other fatigue: Secondary | ICD-10-CM | POA: Diagnosis not present

## 2016-04-11 DIAGNOSIS — R6889 Other general symptoms and signs: Secondary | ICD-10-CM

## 2016-04-11 DIAGNOSIS — D500 Iron deficiency anemia secondary to blood loss (chronic): Secondary | ICD-10-CM

## 2016-04-11 DIAGNOSIS — D696 Thrombocytopenia, unspecified: Secondary | ICD-10-CM

## 2016-04-11 DIAGNOSIS — E559 Vitamin D deficiency, unspecified: Secondary | ICD-10-CM | POA: Diagnosis not present

## 2016-04-11 DIAGNOSIS — D693 Immune thrombocytopenic purpura: Secondary | ICD-10-CM

## 2016-04-11 LAB — CBC & DIFF AND RETIC
BASO%: 0.1 % (ref 0.0–2.0)
Basophils Absolute: 0 10*3/uL (ref 0.0–0.1)
EOS%: 0.7 % (ref 0.0–7.0)
Eosinophils Absolute: 0.1 10*3/uL (ref 0.0–0.5)
HCT: 39 % (ref 34.8–46.6)
HGB: 12.4 g/dL (ref 11.6–15.9)
Immature Retic Fract: 10.5 % — ABNORMAL HIGH (ref 1.60–10.00)
LYMPH%: 15.3 % (ref 14.0–49.7)
MCH: 27.7 pg (ref 25.1–34.0)
MCHC: 31.8 g/dL (ref 31.5–36.0)
MCV: 87.2 fL (ref 79.5–101.0)
MONO#: 0.5 10*3/uL (ref 0.1–0.9)
MONO%: 5 % (ref 0.0–14.0)
NEUT#: 7 10*3/uL — ABNORMAL HIGH (ref 1.5–6.5)
NEUT%: 78.9 % — ABNORMAL HIGH (ref 38.4–76.8)
Platelets: 117 10*3/uL — ABNORMAL LOW (ref 145–400)
RBC: 4.47 10*6/uL (ref 3.70–5.45)
RDW: 15.2 % — ABNORMAL HIGH (ref 11.2–14.5)
Retic %: 1.34 % (ref 0.70–2.10)
Retic Ct Abs: 59.9 10*3/uL (ref 33.70–90.70)
WBC: 8.9 10*3/uL (ref 3.9–10.3)
lymph#: 1.4 10*3/uL (ref 0.9–3.3)

## 2016-04-11 LAB — PROTIME-INR
INR: 1 — ABNORMAL LOW (ref 2.00–3.50)
Protime: 12 Seconds (ref 10.6–13.4)

## 2016-04-11 LAB — FERRITIN: Ferritin: 8 ng/ml — ABNORMAL LOW (ref 9–269)

## 2016-04-11 LAB — IRON AND TIBC
%SAT: 8 % — ABNORMAL LOW (ref 21–57)
Iron: 40 ug/dL — ABNORMAL LOW (ref 41–142)
TIBC: 490 ug/dL — ABNORMAL HIGH (ref 236–444)
UIBC: 450 ug/dL — ABNORMAL HIGH (ref 120–384)

## 2016-04-11 NOTE — Telephone Encounter (Signed)
Appointments scheduled per 1/4 LOS. Patient given AVS report and calendars with future scheduled appointments. °

## 2016-04-12 ENCOUNTER — Other Ambulatory Visit: Payer: Self-pay | Admitting: *Deleted

## 2016-04-12 ENCOUNTER — Encounter: Payer: Self-pay | Admitting: Hematology and Oncology

## 2016-04-12 DIAGNOSIS — R6889 Other general symptoms and signs: Secondary | ICD-10-CM | POA: Insufficient documentation

## 2016-04-12 DIAGNOSIS — K089 Disorder of teeth and supporting structures, unspecified: Secondary | ICD-10-CM | POA: Insufficient documentation

## 2016-04-12 DIAGNOSIS — R5383 Other fatigue: Secondary | ICD-10-CM | POA: Insufficient documentation

## 2016-04-12 LAB — VITAMIN D 25 HYDROXY (VIT D DEFICIENCY, FRACTURES): Vitamin D, 25-Hydroxy: 19.4 ng/mL — ABNORMAL LOW (ref 30.0–100.0)

## 2016-04-12 LAB — APTT: aPTT: 25 s (ref 24–33)

## 2016-04-12 LAB — HEPATITIS C ANTIBODY (REFLEX): HCV Ab: 0.1 s/co ratio (ref 0.0–0.9)

## 2016-04-12 LAB — T3, FREE: Triiodothyronine,Free,Serum: 2.9 pg/mL (ref 2.0–4.4)

## 2016-04-12 LAB — T4, FREE: T4,Free(Direct): 1.12 ng/dL (ref 0.82–1.77)

## 2016-04-12 LAB — FIBRINOGEN: FIBRINOGEN: 397 mg/dL (ref 193–507)

## 2016-04-12 MED ORDER — FERROUS SULFATE 325 (65 FE) MG PO TBEC: 325.0000 mg | DELAYED_RELEASE_TABLET | Freq: Every day | ORAL | 3 refills | Status: DC

## 2016-04-12 MED ORDER — ERGOCALCIFEROL 1.25 MG (50000 UT) PO CAPS: 50000.0000 [IU] | ORAL_CAPSULE | ORAL | 1 refills | Status: DC

## 2016-04-12 NOTE — Assessment & Plan Note (Signed)
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

## 2016-04-12 NOTE — Progress Notes (Signed)
[facility] OFFICE PROGRESS NOTE  EmilyCHARLES A, MD SUMMARY OF HEMATOLOGIC HISTORY: The patient was last seen in August 2016 for gestational ITP She was found to have abnormal CBC from prenatal visits. The patient have prior thrombocytopenia with 2 of her prior pregnancies. Her last platelet count on 09/26/2014 was low at 63,000. With her first pregnancy, her daughter was deliver on 10/22/2011 with epidural and C-section. She did receive prednisone prior to delivery and she responded to the treatment. With her second pregnancy, her son was born on 12/07/2013. She never received corticosteroids therapy and underwent Morse C-section under general anesthesia. On 10/12/2014, she was started on prednisone After delivery of her last child, she had tubal ligation. She was lost to follow-up INTERVAL HISTORY: Emily Morse 31 y.o. female returns for follow-up/urgent evaluation in anticipation for dental surgery She brought with her Morse list of multiple complaints: 1) Irritable 2) Excessive thirst 3) Increased appetite 4) Dry itchy skin 5) Sleepy in the afternoon and restless at night 6) Dry brittle hair 7) Tightening/flutters in her stomach 8) Body is often sore to touch 9) Pain/tightening in hands, feet and toe 10) feet tends to ball up and get stuck in Morse painful position for about 3-5 minutes 11) Frequent swears 12) Constipation  I clarified with her I am Morse hematologist and may not be able to address all her concerns above She complained of frequent menorrhagia; her cycle is 7 days every month with heavy clots The patient denies any recent signs or symptoms of bleeding such as spontaneous epistaxis, hematuria or hematochezia. She denies dizziness or dyspnea on exertion   I have reviewed the past medical history, past surgical history, social history and family history with the patient and they are unchanged from previous note.  ALLERGIES:  has No Known  Allergies.  MEDICATIONS:  Current Outpatient Prescriptions  Medication Sig Dispense Refill  . Prenatal Vit-Fe Fumarate-FA (PNV PRENATAL PLUS MULTIVITAMIN) 27-1 MG TABS TAKE 1 TABLET BY MOUTH DAILY AT 12 NOON. 30 tablet 0  . triamterene-hydrochlorothiazide (DYAZIDE) 37.5-25 MG capsule Take 1 each (1 capsule total) by mouth daily after breakfast. 30 capsule 11  . ergocalciferol (VITAMIN D2) 50000 units capsule Take 1 capsule (50,000 Units total) by mouth once Morse week. 12 capsule 1  . ferrous sulfate 325 (65 FE) MG EC tablet Take 1 tablet (325 mg total) by mouth at bedtime. 60 tablet 3  . valACYclovir (VALTREX) 1000 MG tablet Take 1 tablet 2 times daily for 3 days prn. (Patient not taking: Reported on 04/11/2016) 30 tablet prn   No current facility-administered medications for this visit.      REVIEW OF SYSTEMS:   Eyes: Denies blurriness of vision Ears, nose, mouth, throat, and face: Denies mucositis or sore throat Respiratory: Denies cough, dyspnea or wheezes Cardiovascular: Denies palpitation, chest discomfort or lower extremity swelling Lymphatics: Denies new lymphadenopathy or easy bruising Neurological:Denies numbness, tingling or new weaknesses All other systems were reviewed with the patient and are negative.  PHYSICAL EXAMINATION: ECOG PERFORMANCE STATUS: 1 - Symptomatic but completely ambulatory  Vitals:   04/11/16 0943  BP: 119/82  Pulse: 73  Resp: 18  Temp: 98.8 F (37.1 C)   Filed Weights   04/11/16 0943  Weight: 124 lb (56.2 kg)    GENERAL:alert, no distress and comfortable SKIN: skin color, texture, turgor are normal, no rashes or significant lesions EYES: normal, Conjunctiva are pink and non-injected, sclera clear OROPHARYNX:no exudate, no erythema and lips, buccal mucosa, and  tongue normal  NECK: supple, thyroid normal size, non-tender, without nodularity LYMPH:  no palpable lymphadenopathy in the cervical, axillary or inguinal LUNGS: clear to auscultation and  percussion with normal breathing effort HEART: regular rate & rhythm and no murmurs and no lower extremity edema ABDOMEN:abdomen soft, non-tender and normal bowel sounds Musculoskeletal:no cyanosis of digits and no clubbing  NEURO: alert & oriented x 3 with fluent speech, no focal motor/sensory deficits  LABORATORY DATA:  I have reviewed the data as listed     Component Value Date/Time   NA 140 02/06/2016 1655   K 3.8 02/06/2016 1655   CL 100 02/06/2016 1655   CO2 25 02/06/2016 1655   GLUCOSE 78 02/06/2016 1655   GLUCOSE 95 10/21/2011 1407   BUN 9 02/06/2016 1655   CREATININE 0.63 02/06/2016 1655   CALCIUM 9.4 02/06/2016 1655   PROT 7.5 02/06/2016 1655   ALBUMIN 4.6 02/06/2016 1655   AST 18 02/06/2016 1655   ALT 13 02/06/2016 1655   ALKPHOS 69 02/06/2016 1655   BILITOT <0.2 02/06/2016 1655   GFRNONAA 122 02/06/2016 1655   GFRAA 141 02/06/2016 1655    No results found for: SPEP, UPEP  Lab Results  Component Value Date   WBC 8.9 04/11/2016   NEUTROABS 7.0 (H) 04/11/2016   HGB 12.4 04/11/2016   HCT 39.0 04/11/2016   MCV 87.2 04/11/2016   PLT 117 (L) 04/11/2016      Chemistry      Component Value Date/Time   NA 140 02/06/2016 1655   K 3.8 02/06/2016 1655   CL 100 02/06/2016 1655   CO2 25 02/06/2016 1655   BUN 9 02/06/2016 1655   CREATININE 0.63 02/06/2016 1655      Component Value Date/Time   CALCIUM 9.4 02/06/2016 1655   ALKPHOS 69 02/06/2016 1655   AST 18 02/06/2016 1655   ALT 13 02/06/2016 1655   BILITOT <0.2 02/06/2016 1655       ASSESSMENT & PLAN:  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 She does not need treatment There is no contraindication for her to proceed with dental surgery from my stand point She does not need long term follow-up as long as her platelet count is > 100,000 I provided Morse letter of surgical clearance from the hematology stand point  Iron deficiency anemia due to chronic blood loss She has profound iron deficiency from  menorrhagia This is the most likely cause of brittle hair and leg cramps I recommend trial of oral iron supplement  Menorrhagia with regular cycle This is the cause of iron deficiency I recommend consideration for BCP for this I recommend she discuss this with her GYN doctor  Vitamin D deficiency This could be the cause of her diffuse musculoskeletal pain I recommend high dose vitamin D supplement  Poor dentition She requested medical clearance for anesthesia I am Morse hematologist; I can only provide hematological clearance  Other fatigue Recent TSH, free T3 and T4 were normal Cause unknown, defer to primary doctor for further work-up  Multiple somatic complaints I will not be able to address all her complaints She may need to be referred to see an internal medicine physician/family practitioner   Orders Placed This Encounter  Procedures  . CBC & Diff and Retic    Standing Status:   Future    Number of Occurrences:   1    Standing Expiration Date:   05/16/2017  . Ferritin    Standing Status:   Future  Number of Occurrences:   1    Standing Expiration Date:   05/16/2017  . Iron and TIBC    Standing Status:   Future    Number of Occurrences:   1    Standing Expiration Date:   05/16/2017  . Vitamin D 25 hydroxy    Standing Status:   Future    Number of Occurrences:   1    Standing Expiration Date:   05/16/2017  . APTT    Standing Status:   Future    Number of Occurrences:   1    Standing Expiration Date:   05/16/2017  . Protime-INR    Standing Status:   Future    Number of Occurrences:   1    Standing Expiration Date:   05/16/2017  . Fibrinogen    Standing Status:   Future    Number of Occurrences:   1    Standing Expiration Date:   05/16/2017  . T4, free    Standing Status:   Future    Number of Occurrences:   1    Standing Expiration Date:   05/16/2017  . T3, free    Standing Status:   Future    Number of Occurrences:   1    Standing Expiration Date:   05/16/2017  .  Hepatitis C antibody (reflex if positive)    Standing Status:   Future    Number of Occurrences:   1    Standing Expiration Date:   05/16/2017    All questions were answered. The patient knows to call the clinic with any problems, questions or concerns. No barriers to learning was detected.  I spent 25 minutes counseling the patient face to face. The total time spent in the appointment was 40 minutes and more than 50% was on counseling.     Artis DelayNi Krue Peterka, MD 1/5/20189:48 PM

## 2016-04-12 NOTE — Assessment & Plan Note (Addendum)
She has profound iron deficiency from menorrhagia This is the most likely cause of brittle hair and leg cramps I recommend trial of oral iron supplement

## 2016-04-12 NOTE — Assessment & Plan Note (Signed)
Recent TSH, free T3 and T4 were normal Cause unknown, defer to primary doctor for further work-up

## 2016-04-12 NOTE — Telephone Encounter (Signed)
Notified that letter faxed to Dr Barbette MerinoJensen.  Instructed to take Vitamin D 50,000 units once a week for 12 weeks  Oral iron tablet 1 daily.  Will have labs rechecked here in 3 months- msg to scheduler

## 2016-04-12 NOTE — Assessment & Plan Note (Signed)
She has chronic ITP She does not need treatment There is no contraindication for her to proceed with dental surgery from my stand point She does not need long term follow-up as long as her platelet count is > 100,000 I provided a letter of surgical clearance from the hematology stand point

## 2016-04-12 NOTE — Assessment & Plan Note (Signed)
I will not be able to address all her complaints She may need to be referred to see an internal medicine physician/family practitioner

## 2016-04-12 NOTE — Assessment & Plan Note (Signed)
This could be the cause of her diffuse musculoskeletal pain I recommend high dose vitamin D supplement

## 2016-04-12 NOTE — Assessment & Plan Note (Signed)
This is the cause of iron deficiency I recommend consideration for BCP for this I recommend she discuss this with her GYN doctor

## 2016-05-13 NOTE — Progress Notes (Signed)
[facility]    HEMATOLOGY/ONCOLOGY CONSULTATION NOTE  Date of Service: .04/10/2016  Patient Care Team: Brock Bad, MD as PCP - General (Obstetrics and Gynecology)  CHIEF COMPLAINTS/PURPOSE OF CONSULTATION:  Thrombocytopenia  HISTORY OF PRESENTING ILLNESS:  Emily Morse is a wonderful 31 y.o. female who has been referred to [place] by Dr .Brock Bad, MD for evaluation and management of thrombocytopenia.  Patient has a h/o anemia, GERD, h/o 3 prior C sections (last one in 11/2014), s/p TL who has been referred for evaluation of mild thrombocytopenia.  Patient appears to have a platelet count of 110-120k since late 2016. Prior to this she has had even lower platelet counts during her 3 pregnancies when her patient count were in the 60-100k range.  Patient's recent labs showed platelet count of 114k. No issues with excessive bleeding or bruising. No associated anemia or leucopenia. No h/o recent PRBC transfusion. No recent heparin exposure  No recent new medication. No fevers/chills/night sweats /weight loss. No h/o liver disease Denies significant alcohol use.  MEDICAL HISTORY:  Past Medical History:  Diagnosis Date  . Anemia   . 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 Hx of gastroesophageal reflux (GERD)   . Thrombocytopenia (HCC)     SURGICAL HISTORY: Past Surgical History:  Procedure Laterality Date  . CESAREAN SECTION  10/22/2011   Procedure: CESAREAN SECTION;  Surgeon: Brock Bad, MD;  Location: [facility];  Service: Gynecology;  Laterality: N/A;  . CESAREAN SECTION N/A 12/07/2013   Procedure: CESAREAN SECTION;  Surgeon: Brock Bad, MD;  Location: [facility];  Service: Obstetrics;  Laterality: N/A;  . CESAREAN SECTION N/A 12/05/2014   Procedure: CESAREAN SECTION;  Surgeon: Brock Bad, MD;  Location: [facility];  Service: Obstetrics;  Laterality: N/A;  . DILATION AND CURETTAGE OF UTERUS      SOCIAL HISTORY: Social History   Social History  . Marital status: Single      Spouse name: N/A  . Number of children: N/A  . Years of education: N/A   Occupational History  . Not on file.   Social History Main Topics  . Smoking status: Former Smoker    Types: Cigars    Quit date: 05/06/2013  . Smokeless tobacco: Never Used     Comment: black and mild occasional  . Alcohol use 0.0 oz/week     Comment: Socially  . Drug use: No  . Sexual activity: Yes    Partners: Male    Birth control/ protection: Surgical   Other Topics Concern  . Not on file   Social History Narrative  . No narrative on file    FAMILY HISTORY: Family History  Problem Relation Age of Onset  . Other Neg Hx   . Alcohol abuse Neg Hx   . Arthritis Neg Hx   . Asthma Neg Hx   . Birth defects Neg Hx   . Cancer Neg Hx   . COPD Neg Hx   . Depression Neg Hx   . Diabetes Neg Hx   . Drug abuse Neg Hx   . Early death Neg Hx   . Hearing loss Neg Hx   . Heart disease Neg Hx   . Hyperlipidemia Neg Hx   . Hypertension Neg Hx   . Kidney disease Neg Hx   . Learning disabilities Neg Hx   . Mental illness Neg Hx   . Mental retardation Neg Hx   . Miscarriages / Stillbirths Neg Hx   .  Stroke Neg Hx   . Vision loss Neg Hx   . Varicose Veins Neg Hx     ALLERGIES:  has No Known Allergies.  MEDICATIONS:  Current Outpatient Prescriptions  Medication Sig Dispense Refill  . Prenatal Vit-Fe Fumarate-FA (PNV PRENATAL PLUS MULTIVITAMIN) 27-1 MG TABS TAKE 1 TABLET BY MOUTH DAILY AT 12 NOON. 30 tablet 0  . triamterene-hydrochlorothiazide (DYAZIDE) 37.5-25 MG capsule Take 1 each (1 capsule total) by mouth daily after breakfast. 30 capsule 11  . ergocalciferol (VITAMIN D2) 50000 units capsule Take 1 capsule (50,000 Units total) by mouth once a week. 12 capsule 1  . ferrous sulfate 325 (65 FE) MG EC tablet Take 1 tablet (325 mg total) by mouth at bedtime. 60 tablet 3  . valACYclovir (VALTREX) 1000 MG tablet Take 1 tablet 2 times daily for 3 days prn. (Patient not taking: Reported on 04/11/2016) 30  tablet prn   No current facility-administered medications for this visit.     REVIEW OF SYSTEMS:    10 Point review of Systems was done is negative except as noted above.  PHYSICAL EXAMINATION: ECOG PERFORMANCE STATUS: 0 - Asymptomatic  . Vitals:   04/10/16 1400  BP: 127/75  Pulse: 80  Resp: 16  Temp: 98.4 F (36.9 C)   Filed Weights   04/10/16 1400  Weight: 123 lb 9.6 oz (56.1 kg)   .Body mass index is 21.22 kg/m.  GENERAL:alert, in no acute distress and comfortable SKIN: skin color, texture, turgor are normal, no rashes or significant lesions EYES: normal, conjunctiva are pink and non-injected, sclera clear OROPHARYNX:no exudate, no erythema and lips, buccal mucosa, and tongue normal  NECK: supple, no JVD, thyroid normal size, non-tender, without nodularity LYMPH:  no palpable lymphadenopathy in the cervical, axillary or inguinal LUNGS: clear to auscultation with normal respiratory effort HEART: regular rate & rhythm,  no murmurs and no lower extremity edema ABDOMEN: abdomen soft, non-tender, normoactive bowel sounds , no palpable hepatosplenomegaly Musculoskeletal: no cyanosis of digits and no clubbing  PSYCH: alert & oriented x 3 with fluent speech NEURO: no focal motor/sensory deficits  LABORATORY DATA:  I have reviewed the data as listed  . CBC Latest Ref Rng & Units 04/11/2016 02/06/2016 03/01/2015  WBC 3.9 - 10.3 10e3/uL 8.9 6.6 7.0  Hemoglobin 11.6 - 15.9 g/dL 16.112.4 - 09.613.2  Hematocrit 34.8 - 46.6 % 39.0 37.1 39.7  Platelets 145 - 400 10e3/uL 117(L) 114(L) 120(L)    . CMP Latest Ref Rng & Units 02/06/2016 10/21/2011 10/21/2011  Glucose 65 - 99 mg/dL 78 95 045(W152(H)  BUN 6 - 20 mg/dL 9 <0(J<3(L) <8(J<3(L)  Creatinine 0.57 - 1.00 mg/dL 1.910.63 4.78(G0.48(L) 9.56(O0.47(L)  Sodium 134 - 144 mmol/L 140 140 141  Potassium 3.5 - 5.2 mmol/L 3.8 2.1(LL) 2.1(LL)  Chloride 96 - 106 mmol/L 100 105 105  CO2 18 - 29 mmol/L 25 24 23   Calcium 8.7 - 10.2 mg/dL 9.4 9.1 8.9  Total Protein 6.0 -  8.5 g/dL 7.5 5.7(L) 6.1  Total Bilirubin 0.0 - 1.2 mg/dL <1.3<0.2 0.3 0.3  Alkaline Phos 39 - 117 IU/L 69 125(H) 136(H)  AST 0 - 40 IU/L 18 35 34  ALT 0 - 32 IU/L 13 23 24    Component     Latest Ref Rng & Units 04/11/2016  Iron     41 - 142 ug/dL 40 (L)  TIBC     086236 - 444 ug/dL 578490 (H)  UIBC     469120 - 384 ug/dL 629450 (H)  %  SAT     21 - 57 % 8 (L)  Protime     10.6 - 13.4 Seconds 12.0  INR     2.00 - 3.50 1.00 (L)  Lovenox      No  HCV Ab     0.0 - 0.9 s/co ratio 0.1  Comment      Comment  Ferritin     9 - 269 ng/ml 8 (L)  Vitamin D, 25-Hydroxy     30.0 - 100.0 ng/mL 19.4 (L)  APTT     24 - 33 sec 25  Fibrinogen     193 - 507 mg/dL 098  J1,BJYN(WGNFAO)     0.82 - 1.77 ng/dL 1.30  Triiodothyronine,Free,Serum     2.0 - 4.4 pg/mL 2.9       RADIOGRAPHIC STUDIES: I have personally reviewed the radiological images as listed and agreed with the findings in the report. No results found.  ASSESSMENT & PLAN:   31 yo with   1) Mild thrombocytopenia  PLT count of 117k with no issues of bleeding or bruising PBS - no overt platelet clumping noted Nl coags Hep C ab neg Could be related to be diuretic (HCTZ if known to cause thrombocytopenia) PLT counts relatively stable with no progressive drop  No associated anemia Patient notes to have Iron def -- this typically causes reactive thrombocytosis but can sometimes cause thrombocytopenia.  2) Iron deficiency due to iron store depletion with successive pregnancies  PLAN -no additional workup or BM Bx recommendation at this time for w/u of mild thrombocytopenia -avoid OTC NSAIDS and PPI - common meds causing thrombocytopenia -minimize ETOH use -replace Iron def po as per PCP -reasonable to use empiric B complex to address any subtle B vit deficiencies -on Ergocalciferol per PCP to address her Vit D deficiency. -rpt CBC q6 months.  Further f/u with PCP -.[name],[name], MD  Kindly re-consult [place] if worsening  thrombocytopenia if PLT dropping progressive below 100k  Or other cytopenias develop.   All of the patients questions were answered with apparent satisfaction. The patient knows to call the clinic with any problems, questions or concerns.  I spent 40 minutes counseling the patient face to face. The total time spent in the appointment was 50 minutes and more than 50% was on counseling and direct patient cares.    Wyvonnia Lora MD MS AAHIVMS [facility] Hematology/Oncology Physician [facility]  (Office):       [phone] (Work cell):  [phone] (Fax):           [phone]

## 2016-07-09 ENCOUNTER — Other Ambulatory Visit: Payer: Self-pay | Admitting: Hematology and Oncology

## 2016-07-09 DIAGNOSIS — E559 Vitamin D deficiency, unspecified: Secondary | ICD-10-CM

## 2016-07-09 DIAGNOSIS — D500 Iron deficiency anemia secondary to blood loss (chronic): Secondary | ICD-10-CM

## 2016-07-10 ENCOUNTER — Other Ambulatory Visit (HOSPITAL_BASED_OUTPATIENT_CLINIC_OR_DEPARTMENT_OTHER): Payer: Medicaid Other

## 2016-07-10 ENCOUNTER — Telehealth: Payer: Self-pay | Admitting: *Deleted

## 2016-07-10 DIAGNOSIS — N920 Excessive and frequent menstruation with regular cycle: Secondary | ICD-10-CM

## 2016-07-10 DIAGNOSIS — D500 Iron deficiency anemia secondary to blood loss (chronic): Secondary | ICD-10-CM | POA: Diagnosis not present

## 2016-07-10 DIAGNOSIS — D693 Immune thrombocytopenic purpura: Secondary | ICD-10-CM

## 2016-07-10 DIAGNOSIS — E559 Vitamin D deficiency, unspecified: Secondary | ICD-10-CM

## 2016-07-10 LAB — CBC & DIFF AND RETIC
BASO%: 0.3 % (ref 0.0–2.0)
Basophils Absolute: 0 10*3/uL (ref 0.0–0.1)
EOS%: 1.4 % (ref 0.0–7.0)
Eosinophils Absolute: 0.1 10*3/uL (ref 0.0–0.5)
HCT: 34.4 % — ABNORMAL LOW (ref 34.8–46.6)
HGB: 10.5 g/dL — ABNORMAL LOW (ref 11.6–15.9)
Immature Retic Fract: 13.5 % — ABNORMAL HIGH (ref 1.60–10.00)
LYMPH%: 18.4 % (ref 14.0–49.7)
MCH: 24.9 pg — ABNORMAL LOW (ref 25.1–34.0)
MCHC: 30.5 g/dL — ABNORMAL LOW (ref 31.5–36.0)
MCV: 81.7 fL (ref 79.5–101.0)
MONO#: 0.5 10*3/uL (ref 0.1–0.9)
MONO%: 6.2 % (ref 0.0–14.0)
NEUT#: 5.3 10*3/uL (ref 1.5–6.5)
NEUT%: 73.7 % (ref 38.4–76.8)
Platelets: 167 10*3/uL (ref 145–400)
RBC: 4.21 10*6/uL (ref 3.70–5.45)
RDW: 14.8 % — ABNORMAL HIGH (ref 11.2–14.5)
Retic %: 1.82 % (ref 0.70–2.10)
Retic Ct Abs: 76.62 10*3/uL (ref 33.70–90.70)
WBC: 7.2 10*3/uL (ref 3.9–10.3)
lymph#: 1.3 10*3/uL (ref 0.9–3.3)

## 2016-07-10 LAB — FERRITIN: Ferritin: 4 ng/ml — ABNORMAL LOW (ref 9–269)

## 2016-07-10 NOTE — Telephone Encounter (Signed)
Pt is in lab requesting TSH level to be drawn. States she has recently gained 8 lbs

## 2016-07-10 NOTE — Telephone Encounter (Signed)
Notified to contact PCP for weight gain concerns.

## 2016-07-11 ENCOUNTER — Other Ambulatory Visit: Payer: Self-pay | Admitting: Hematology and Oncology

## 2016-07-11 ENCOUNTER — Telehealth: Payer: Self-pay

## 2016-07-11 LAB — VITAMIN D 25 HYDROXY (VIT D DEFICIENCY, FRACTURES): Vitamin D, 25-Hydroxy: 26.9 ng/mL — ABNORMAL LOW (ref 30.0–100.0)

## 2016-07-11 MED ORDER — ERGOCALCIFEROL 1.25 MG (50000 UT) PO CAPS: 50000.0000 [IU] | ORAL_CAPSULE | ORAL | 1 refills | Status: DC

## 2016-07-11 NOTE — Telephone Encounter (Signed)
-----   Message from Artis Delay, MD sent at 07/11/2016  8:59 AM EDT ----- Regarding: labs She is noted to be iron deficient still I recommend IV iron The earliest would be on 4/17: I will place scheduling msg I also recommend vitamin D supplement 50,000 units PO weekly x 12 weeks Please call it in ----- Message ----- From: Interface, Lab In Three Zero One Sent: 07/10/2016  12:46 PM To: Artis Delay, MD

## 2016-07-11 NOTE — Telephone Encounter (Signed)
Patient given below message. Rx refilled for vitamin D.

## 2016-07-22 ENCOUNTER — Other Ambulatory Visit: Payer: Self-pay | Admitting: Hematology and Oncology

## 2016-07-22 DIAGNOSIS — R5383 Other fatigue: Secondary | ICD-10-CM

## 2016-07-22 DIAGNOSIS — D500 Iron deficiency anemia secondary to blood loss (chronic): Secondary | ICD-10-CM

## 2016-07-23 ENCOUNTER — Ambulatory Visit (HOSPITAL_BASED_OUTPATIENT_CLINIC_OR_DEPARTMENT_OTHER): Payer: Medicaid Other | Admitting: Hematology and Oncology

## 2016-07-23 ENCOUNTER — Other Ambulatory Visit (HOSPITAL_BASED_OUTPATIENT_CLINIC_OR_DEPARTMENT_OTHER): Payer: Medicaid Other

## 2016-07-23 ENCOUNTER — Ambulatory Visit (HOSPITAL_BASED_OUTPATIENT_CLINIC_OR_DEPARTMENT_OTHER): Payer: Medicaid Other

## 2016-07-23 ENCOUNTER — Encounter: Payer: Self-pay | Admitting: Hematology and Oncology

## 2016-07-23 ENCOUNTER — Telehealth: Payer: Self-pay | Admitting: Hematology and Oncology

## 2016-07-23 VITALS — BP 109/73 | HR 80 | Temp 98.4°F | Resp 18 | Ht 64.0 in | Wt 133.4 lb

## 2016-07-23 VITALS — BP 104/72 | HR 74 | Temp 98.4°F | Resp 18

## 2016-07-23 DIAGNOSIS — D500 Iron deficiency anemia secondary to blood loss (chronic): Secondary | ICD-10-CM

## 2016-07-23 DIAGNOSIS — N920 Excessive and frequent menstruation with regular cycle: Secondary | ICD-10-CM

## 2016-07-23 DIAGNOSIS — D693 Immune thrombocytopenic purpura: Secondary | ICD-10-CM | POA: Diagnosis not present

## 2016-07-23 DIAGNOSIS — R5383 Other fatigue: Secondary | ICD-10-CM

## 2016-07-23 DIAGNOSIS — E559 Vitamin D deficiency, unspecified: Secondary | ICD-10-CM | POA: Diagnosis not present

## 2016-07-23 DIAGNOSIS — D696 Thrombocytopenia, unspecified: Secondary | ICD-10-CM

## 2016-07-23 LAB — CBC WITH DIFFERENTIAL/PLATELET
BASO%: 0.7 % (ref 0.0–2.0)
Basophils Absolute: 0 10*3/uL (ref 0.0–0.1)
EOS%: 1.8 % (ref 0.0–7.0)
Eosinophils Absolute: 0.1 10*3/uL (ref 0.0–0.5)
HCT: 32.5 % — ABNORMAL LOW (ref 34.8–46.6)
HGB: 10.1 g/dL — ABNORMAL LOW (ref 11.6–15.9)
LYMPH%: 18.1 % (ref 14.0–49.7)
MCH: 24.5 pg — ABNORMAL LOW (ref 25.1–34.0)
MCHC: 31.2 g/dL — ABNORMAL LOW (ref 31.5–36.0)
MCV: 78.7 fL — ABNORMAL LOW (ref 79.5–101.0)
MONO#: 0.4 10*3/uL (ref 0.1–0.9)
MONO%: 5.8 % (ref 0.0–14.0)
NEUT#: 4.6 10*3/uL (ref 1.5–6.5)
NEUT%: 73.6 % (ref 38.4–76.8)
Platelets: 108 10*3/uL — ABNORMAL LOW (ref 145–400)
RBC: 4.13 10*6/uL (ref 3.70–5.45)
RDW: 15.8 % — ABNORMAL HIGH (ref 11.2–14.5)
WBC: 6.3 10*3/uL (ref 3.9–10.3)
lymph#: 1.1 10*3/uL (ref 0.9–3.3)

## 2016-07-23 LAB — TSH: TSH: 0.649 m(IU)/L (ref 0.308–3.960)

## 2016-07-23 MED ORDER — SODIUM CHLORIDE 0.9 % IV SOLN
Freq: Once | INTRAVENOUS | Status: AC
  Administered 2016-07-23: 10:00:00 via INTRAVENOUS

## 2016-07-23 MED ORDER — SODIUM CHLORIDE 0.9 % IV SOLN
510.0000 mg | Freq: Once | INTRAVENOUS | Status: AC
  Administered 2016-07-23: 510 mg via INTRAVENOUS
  Filled 2016-07-23: qty 17

## 2016-07-23 NOTE — Assessment & Plan Note (Signed)
This is the cause of iron deficiency I recommend consideration for BCP for this I recommend she discuss this with her GYN doctor

## 2016-07-23 NOTE — Progress Notes (Signed)
Pt tolerated first time ferhaheme today without any complaints. Reviewed possible side effects that pt may experience a few days after iron infusion. Pt to come back for 2nd dose of iv iron next week. avs printed and reviewed with pt, along with education information regarding iron deficiency anemia. Pt verbalized understanding.

## 2016-07-23 NOTE — Telephone Encounter (Signed)
Appointments scheduled per 4.17.18 LOS. Patient given AVS report and calendars with future scheduled appointments. °

## 2016-07-23 NOTE — Assessment & Plan Note (Signed)
She has chronic ITP She does not need treatment and can be observed only 

## 2016-07-23 NOTE — Progress Notes (Signed)
[facility] OFFICE PROGRESS NOTE  [name],[name], MD SUMMARY OF HEMATOLOGIC HISTORY:  The patient was last seen in August 2016 for gestational ITP She was found to have abnormal CBC from prenatal visits. The patient have prior thrombocytopenia with 2 of her prior pregnancies. Her last platelet count on 09/26/2014 was low at 63,000. With her first pregnancy, her daughter was deliver on 10/22/2011 with epidural and C-section. She did receive prednisone prior to delivery and she responded to the treatment. With her second pregnancy, her son was born on 12/07/2013. She never received corticosteroids therapy and underwent a C-section under general anesthesia. On 10/12/2014, she was started on prednisone After delivery of her last child, she had tubal ligation.  INTERVAL HISTORY: Emily Morse 31 y.o. female returns for further follow-up. She complain of fatigue. She continues to have heavy menstruation. She has appointment pending to see a gynecologist for management of this She have diffuse musculoskeletal pain. She has started taking vitamin D supplement as prescribed She was started on oral iron supplement but did not tolerate that due to constipation. She is interested to try IV iron  I have reviewed the past medical history, past surgical history, social history and family history with the patient and they are unchanged from previous note.  ALLERGIES:  has No Known Allergies.  MEDICATIONS:  Current Outpatient Prescriptions  Medication Sig Dispense Refill  . ergocalciferol (VITAMIN D2) 50000 units capsule Take 1 capsule (50,000 Units total) by mouth once a week. 12 capsule 1  . ferrous sulfate 325 (65 FE) MG EC tablet Take 1 tablet (325 mg total) by mouth at bedtime. (Patient not taking: Reported on 07/23/2016) 60 tablet 3  . valACYclovir (VALTREX) 1000 MG tablet Take 1 tablet 2 times daily for 3 days prn. (Patient not taking: Reported on 04/11/2016) 30 tablet prn    No current facility-administered medications for this visit.    Facility-Administered Medications Ordered in Other Visits  Medication Dose Route Frequency Provider Last Rate Last Dose  . ferumoxytol (FERAHEME) 510 mg in sodium chloride 0.9 % 100 mL IVPB  510 mg Intravenous Once Artis Delay, MD   510 mg at 07/23/16 1014     REVIEW OF SYSTEMS:   Constitutional: Denies fevers, chills or night sweats Eyes: Denies blurriness of vision Ears, nose, mouth, throat, and face: Denies mucositis or sore throat Respiratory: Denies cough, dyspnea or wheezes Cardiovascular: Denies palpitation, chest discomfort or lower extremity swelling Gastrointestinal:  Denies nausea, heartburn or change in bowel habits Skin: Denies abnormal skin rashes Lymphatics: Denies new lymphadenopathy or easy bruising Neurological:Denies numbness, tingling or new weaknesses Behavioral/Psych: Mood is stable, no new changes  All other systems were reviewed with the patient and are negative.  PHYSICAL EXAMINATION: ECOG PERFORMANCE STATUS: 1 - Symptomatic but completely ambulatory  Vitals:   07/23/16 0909  BP: 109/73  Pulse: 80  Resp: 18  Temp: 98.4 F (36.9 C)   Filed Weights   07/23/16 0909  Weight: 133 lb 6.4 oz (60.5 kg)    GENERAL:alert, no distress and comfortable SKIN: skin color, texture, turgor are normal, no rashes or significant lesions EYES: normal, Conjunctiva are pink and non-injected, sclera clear Musculoskeletal:no cyanosis of digits and no clubbing  NEURO: alert & oriented x 3 with fluent speech, no focal motor/sensory deficits  LABORATORY DATA:  I have reviewed the data as listed     Component Value Date/Time   NA 140 02/06/2016 1655   K 3.8 02/06/2016 1655   CL  100 02/06/2016 1655   CO2 25 02/06/2016 1655   GLUCOSE 78 02/06/2016 1655   GLUCOSE 95 10/21/2011 1407   BUN 9 02/06/2016 1655   CREATININE 0.63 02/06/2016 1655   CALCIUM 9.4 02/06/2016 1655   PROT 7.5 02/06/2016 1655    ALBUMIN 4.6 02/06/2016 1655   AST 18 02/06/2016 1655   ALT 13 02/06/2016 1655   ALKPHOS 69 02/06/2016 1655   BILITOT <0.2 02/06/2016 1655   GFRNONAA 122 02/06/2016 1655   GFRAA 141 02/06/2016 1655    No results found for: SPEP, UPEP  Lab Results  Component Value Date   WBC 6.3 07/23/2016   NEUTROABS 4.6 07/23/2016   HGB 10.1 (L) 07/23/2016   HCT 32.5 (L) 07/23/2016   MCV 78.7 (L) 07/23/2016   PLT 108 (L) 07/23/2016      Chemistry      Component Value Date/Time   NA 140 02/06/2016 1655   K 3.8 02/06/2016 1655   CL 100 02/06/2016 1655   CO2 25 02/06/2016 1655   BUN 9 02/06/2016 1655   CREATININE 0.63 02/06/2016 1655      Component Value Date/Time   CALCIUM 9.4 02/06/2016 1655   ALKPHOS 69 02/06/2016 1655   AST 18 02/06/2016 1655   ALT 13 02/06/2016 1655   BILITOT <0.2 02/06/2016 1655      ASSESSMENT & PLAN:  Iron deficiency anemia due to chronic blood loss The most likely cause of her anemia is due to chronic blood loss/malabsorption syndrome. We discussed some of the risks, benefits, and alternatives of intravenous iron infusions. The patient is symptomatic from anemia and the iron level is critically low. She tolerated oral iron supplement poorly and desires to achieved higher levels of iron faster for adequate hematopoesis. Some of the side-effects to be expected including risks of infusion reactions, phlebitis, headaches, nausea and fatigue.  The patient is willing to proceed. Patient education material was dispensed.  Goal is to keep ferritin level greater than 50 I plan to recheck CBC with iron studies again in 6 months with possible need for further iron infusion, unless her menorrhagia is appear under good control under the direction of her gynecologist   Menorrhagia with regular cycle This is the cause of iron deficiency I recommend consideration for BCP for this I recommend she discuss this with her GYN doctor  Thrombocytopenia (HCC) She has chronic  ITP She does not need treatment and can be observed only  Vitamin D deficiency This could be the cause of her diffuse musculoskeletal pain I recommend high dose vitamin D supplement I plan to recheck vitamin D level again in 6 months   Orders Placed This Encounter  Procedures  . CBC & Diff and Retic    Standing Status:   Future    Standing Expiration Date:   08/27/2017  . Ferritin    Standing Status:   Future    Standing Expiration Date:   08/27/2017  . Vitamin D 25 hydroxy    Standing Status:   Future    Standing Expiration Date:   08/27/2017    All questions were answered. The patient knows to call the clinic with any problems, questions or concerns. No barriers to learning was detected.  I spent 15 minutes counseling the patient face to face. The total time spent in the appointment was 20 minutes and more than 50% was on counseling.     Artis Delay, MD 4/17/201810:22 AM

## 2016-07-23 NOTE — Patient Instructions (Signed)
Iron Deficiency Anemia, Adult Iron deficiency anemia is a condition in which the concentration of red blood cells or hemoglobin in the blood is below normal because of too little iron. Hemoglobin is a substance in red blood cells that carries oxygen to the body's tissues. When the concentration of red blood cells or hemoglobin is too low, not enough oxygen reaches these tissues. Iron deficiency anemia is usually long-lasting (chronic) and it develops over time. It may or may not cause symptoms. It is a common type of anemia. What are the causes? This condition may be caused by:  Not enough iron in the diet.  Blood loss caused by bleeding in the intestine.  Blood loss from a gastrointestinal condition like Crohn disease.  Frequent blood draws, such as from blood donation.  Abnormal absorption in the gut.  Heavy menstrual periods in women.  Cancers of the gastrointestinal system, such as colon cancer. What are the signs or symptoms? Symptoms of this condition may include:  Fatigue.  Headache.  Pale skin, lips, and nail beds.  Poor appetite.  Weakness.  Shortness of breath.  Dizziness.  Cold hands and feet.  Fast or irregular heartbeat.  Irritability. This is more common in severe anemia.  Rapid breathing. This is more common in severe anemia. Mild anemia may not cause any symptoms. How is this diagnosed? This condition is diagnosed based on:  Your medical history.  A physical exam.  Blood tests. You may have additional tests to find the underlying cause of your anemia, such as:  Testing for blood in the stool (fecal occult blood test).  A procedure to see inside your colon and rectum (colonoscopy).  A procedure to see inside your esophagus and stomach (endoscopy).  A test in which cells are removed from bone marrow (bone marrow aspiration) or fluid is removed from the bone marrow to be examined (biopsy). This is rarely needed. How is this treated? This  condition is treated by correcting the cause of your iron deficiency. Treatment may involve:  Adding iron-rich foods to your diet.  Taking iron supplements. If you are pregnant or breastfeeding, you may need to take extra iron because your normal diet usually does not provide the amount of iron that you need.  Increasing vitamin C intake. Vitamin C helps your body absorb iron. Your health care provider may recommend that you take iron supplements along with a glass of orange juice or a vitamin C supplement.  Medicines to make heavy menstrual flow lighter.  Surgery. You may need repeat blood tests to determine whether treatment is working. Depending on the underlying cause, the anemia should be corrected within 2 months of starting treatment. If the treatment does not seem to be working, you may need more testing. Follow these instructions at home: Medicines   Take over-the-counter and prescription medicines only as told by your health care provider. This includes iron supplements and vitamins.  If you cannot tolerate taking iron supplements by mouth, talk with your health care provider about taking them through a vein (intravenously) or an injection into a muscle.  For the best iron absorption, you should take iron supplements when your stomach is empty. If you cannot tolerate them on an empty stomach, you may need to take them with food.  Do not drink milk or take antacids at the same time as your iron supplements. Milk and antacids may interfere with iron absorption.  Iron supplements can cause constipation. To prevent constipation, include fiber in your diet as  told by your health care provider. A stool softener may also be recommended. Eating and drinking   Talk with your health care provider before changing your diet. He or she may recommend that you eat foods that contain a lot of iron, such as:  Liver.  Low-fat (lean) beef.  Breads and cereals that have iron added to them (are  fortified).  Eggs.  Dried fruit.  Dark green, leafy vegetables.  To help your body use the iron from iron-rich foods, eat those foods at the same time as fresh fruits and vegetables that are high in vitamin C. Foods that are high in vitamin C include:  Oranges.  Peppers.  Tomatoes.  Mangoes.  Drinkenoughfluid to keep your urine clear or pale yellow. General instructions   Return to your normal activities as told by your health care provider. Ask your health care provider what activities are safe for you.  Practice good hygiene. Anemia can make you more prone to illness and infection.  Keep all follow-up visits as told by your health care provider. This is important. Contact a health care provider if:  You feel nauseous or you vomit.  You feel weak.  You have unexplained sweating.  You develop symptoms of constipation, such as:  Having fewer than three bowel movements a week.  Straining to have a bowel movement.  Having stools that are hard, dry, or larger than normal.  Feeling full or bloated.  Pain in the lower abdomen.  Not feeling relief after having a bowel movement. Get help right away if:  You faint. If this happens, do not drive yourself to the hospital. Call your local emergency services (911 in the [place]  You have chest pain.  You have shortness of breath that:  Is severe.  Gets worse with physical activity.  You have a rapid heartbeat.  You become light-headed when getting up from a sitting or lying down position. This information is not intended to replace advice given to you by your health care provider. Make sure you discuss any questions you have with your health care provider. Document Released: 03/22/2000 Document Revised: 12/13/2015 Document Reviewed: 12/13/2015 Elsevier Interactive Patient Education  2017 Elsevier Inc.   Ferumoxytol injection IV IRON What is this medicine? FERUMOXYTOL is an iron complex. Iron is used to make  healthy red blood cells, which carry oxygen and nutrients throughout the body. This medicine is used to treat iron deficiency anemia in people with chronic kidney disease. This medicine may be used for other purposes; ask your health care provider or pharmacist if you have questions. COMMON BRAND NAME(S): Feraheme What should I tell my health care provider before I take this medicine? They need to know if you have any of these conditions: -anemia not caused by low iron levels -high levels of iron in the blood -magnetic resonance imaging (MRI) test scheduled -an unusual or allergic reaction to iron, other medicines, foods, dyes, or preservatives -pregnant or trying to get pregnant -breast-feeding How should I use this medicine? This medicine is for injection into a vein. It is given by a health care professional in a hospital or clinic setting. Talk to your pediatrician regarding the use of this medicine in children. Special care may be needed. Overdosage: If you think you have taken too much of this medicine contact a poison control center or emergency room at once. NOTE: This medicine is only for you. Do not share this medicine with others. What if I miss a dose? It is  important not to miss your dose. Call your doctor or health care professional if you are unable to keep an appointment. What may interact with this medicine? This medicine may interact with the following medications: -other iron products This list may not describe all possible interactions. Give your health care provider a list of all the medicines, herbs, non-prescription drugs, or dietary supplements you use. Also tell them if you smoke, drink alcohol, or use illegal drugs. Some items may interact with your medicine. What should I watch for while using this medicine? Visit your doctor or healthcare professional regularly. Tell your doctor or healthcare professional if your symptoms do not start to get better or if they get  worse. You may need blood work done while you are taking this medicine. You may need to follow a special diet. Talk to your doctor. Foods that contain iron include: whole grains/cereals, dried fruits, beans, or peas, leafy green vegetables, and organ meats (liver, kidney). What side effects may I notice from receiving this medicine? Side effects that you should report to your doctor or health care professional as soon as possible: -allergic reactions like skin rash, itching or hives, swelling of the face, lips, or tongue -breathing problems -changes in blood pressure -feeling faint or lightheaded, falls -fever or chills -flushing, sweating, or hot feelings -swelling of the ankles or feet Side effects that usually do not require medical attention (report to your doctor or health care professional if they continue or are bothersome): -diarrhea -headache -nausea, vomiting -stomach pain This list may not describe all possible side effects. Call your doctor for medical advice about side effects. You may report side effects to FDA at 1-800-FDA-1088. Where should I keep my medicine? This drug is given in a hospital or clinic and will not be stored at home. NOTE: This sheet is a summary. It may not cover all possible information. If you have questions about this medicine, talk to your doctor, pharmacist, or health care provider.  2018 Elsevier/Gold Standard (2015-04-27 12:41:49)

## 2016-07-23 NOTE — Assessment & Plan Note (Signed)
This could be the cause of her diffuse musculoskeletal pain I recommend high dose vitamin D supplement I plan to recheck vitamin D level again in 6 months

## 2016-07-23 NOTE — Assessment & Plan Note (Signed)
The most likely cause of her anemia is due to chronic blood loss/malabsorption syndrome. We discussed some of the risks, benefits, and alternatives of intravenous iron infusions. The patient is symptomatic from anemia and the iron level is critically low. She tolerated oral iron supplement poorly and desires to achieved higher levels of iron faster for adequate hematopoesis. Some of the side-effects to be expected including risks of infusion reactions, phlebitis, headaches, nausea and fatigue.  The patient is willing to proceed. Patient education material was dispensed.  Goal is to keep ferritin level greater than 50 I plan to recheck CBC with iron studies again in 6 months with possible need for further iron infusion, unless her menorrhagia is appear under good control under the direction of her gynecologist

## 2016-07-25 ENCOUNTER — Ambulatory Visit (INDEPENDENT_AMBULATORY_CARE_PROVIDER_SITE_OTHER): Payer: Medicaid Other | Admitting: Obstetrics

## 2016-07-25 ENCOUNTER — Ambulatory Visit: Payer: Medicaid Other | Admitting: Obstetrics

## 2016-07-25 ENCOUNTER — Other Ambulatory Visit (HOSPITAL_COMMUNITY)
Admission: RE | Admit: 2016-07-25 | Discharge: 2016-07-25 | Disposition: A | Discharge status: Home / Self Care | Payer: Medicaid Other | Source: Ambulatory Visit | Attending: Obstetrics | Admitting: Obstetrics

## 2016-07-25 VITALS — BP 138/94 | HR 67 | Wt 129.0 lb

## 2016-07-25 DIAGNOSIS — F5101 Primary insomnia: Secondary | ICD-10-CM | POA: Diagnosis not present

## 2016-07-25 DIAGNOSIS — N939 Abnormal uterine and vaginal bleeding, unspecified: Secondary | ICD-10-CM | POA: Diagnosis not present

## 2016-07-25 MED ORDER — ZOLPIDEM TARTRATE 5 MG PO TABS: 5.0000 mg | ORAL_TABLET | Freq: Every evening | ORAL | 2 refills | Status: DC | PRN

## 2016-07-25 MED ORDER — MEDROXYPROGESTERONE ACETATE 150 MG/ML IM SUSP: 150.0000 mg | INTRAMUSCULAR | 3 refills | Status: DC

## 2016-07-26 ENCOUNTER — Ambulatory Visit (INDEPENDENT_AMBULATORY_CARE_PROVIDER_SITE_OTHER): Payer: Medicaid Other

## 2016-07-26 ENCOUNTER — Encounter: Payer: Self-pay | Admitting: Obstetrics

## 2016-07-26 DIAGNOSIS — Z3202 Encounter for pregnancy test, result negative: Secondary | ICD-10-CM | POA: Diagnosis not present

## 2016-07-26 DIAGNOSIS — Z308 Encounter for other contraceptive management: Secondary | ICD-10-CM | POA: Diagnosis not present

## 2016-07-26 DIAGNOSIS — N939 Abnormal uterine and vaginal bleeding, unspecified: Secondary | ICD-10-CM

## 2016-07-26 LAB — URINE CYTOLOGY ANCILLARY ONLY
Chlamydia: NEGATIVE
Neisseria Gonorrhea: NEGATIVE
Trichomonas: NEGATIVE

## 2016-07-26 MED ORDER — MEDROXYPROGESTERONE ACETATE 150 MG/ML IM SUSP
150.0000 mg | Freq: Once | INTRAMUSCULAR | Status: AC
  Administered 2016-07-26: 150 mg via INTRAMUSCULAR

## 2016-07-26 NOTE — Progress Notes (Signed)
Patient ID: Emily Morse, female   DOB: 06/27/1985, 31 y.o.   MRN: [id number]  Chief Complaint  Patient presents with  . Menorrhagia    HPI MAKYLAH Morse is a 31 y.o. female.  Heavy periods. HPI  Past Medical History:  Diagnosis Date  . Anemia   . 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 Hx of gastroesophageal reflux (GERD)   . Thrombocytopenia (HCC)     Past Surgical History:  Procedure Laterality Date  . CESAREAN SECTION  10/22/2011   Procedure: CESAREAN SECTION;  Surgeon: Brock Bad, MD;  Location: [facility];  Service: Gynecology;  Laterality: N/A;  . CESAREAN SECTION N/A 12/07/2013   Procedure: CESAREAN SECTION;  Surgeon: Brock Bad, MD;  Location: [facility];  Service: Obstetrics;  Laterality: N/A;  . CESAREAN SECTION N/A 12/05/2014   Procedure: CESAREAN SECTION;  Surgeon: Brock Bad, MD;  Location: [facility];  Service: Obstetrics;  Laterality: N/A;  . DILATION AND CURETTAGE OF UTERUS      Family History  Problem Relation Age of Onset  . Other Neg Hx   . Alcohol abuse Neg Hx   . Arthritis Neg Hx   . Asthma Neg Hx   . Birth defects Neg Hx   . Cancer Neg Hx   . COPD Neg Hx   . Depression Neg Hx   . Diabetes Neg Hx   . Drug abuse Neg Hx   . Early death Neg Hx   . Hearing loss Neg Hx   . Heart disease Neg Hx   . Hyperlipidemia Neg Hx   . Hypertension Neg Hx   . Kidney disease Neg Hx   . Learning disabilities Neg Hx   . Mental illness Neg Hx   . Mental retardation Neg Hx   . Miscarriages / Stillbirths Neg Hx   . Stroke Neg Hx   . Vision loss Neg Hx   . Varicose Veins Neg Hx     Social History Social History  Substance Use Topics  . Smoking status: Former Smoker    Types: Cigars    Quit date: 05/06/2013  . Smokeless tobacco: Never Used     Comment: black and mild occasional  . Alcohol use 0.0 oz/week     Comment: Socially    No Known Allergies  Current Outpatient Prescriptions  Medication Sig Dispense Refill  . ergocalciferol (VITAMIN D2) 50000  units capsule Take 1 capsule (50,000 Units total) by mouth once a week. 12 capsule 1  . ferrous sulfate 325 (65 FE) MG EC tablet Take 1 tablet (325 mg total) by mouth at bedtime. (Patient not taking: Reported on 07/23/2016) 60 tablet 3  . medroxyPROGESTERone (DEPO-PROVERA) 150 MG/ML injection Inject 1 mL (150 mg total) into the muscle every 3 (three) months. 1 mL 3  . valACYclovir (VALTREX) 1000 MG tablet Take 1 tablet 2 times daily for 3 days prn. (Patient not taking: Reported on 04/11/2016) 30 tablet prn  . zolpidem (AMBIEN) 5 MG tablet Take 1 tablet (5 mg total) by mouth at bedtime as needed for sleep. 30 tablet 2   No current facility-administered medications for this visit.     Review of Systems Review of Systems Constitutional: negative for fatigue and weight loss Respiratory: negative for cough and wheezing Cardiovascular: negative for chest pain, fatigue and palpitations Gastrointestinal: negative for abdominal pain and change in bowel habits Genitourinary:negative Integument/breast: negative for nipple discharge Musculoskeletal:negative for myalgias Neurological: negative for gait problems and tremors  Behavioral/Psych: negative for abusive relationship, depression Endocrine: negative for temperature intolerance      Blood pressure (!) 138/94, pulse 67, weight 129 lb (58.5 kg), last menstrual period 07/20/2016, not currently breastfeeding.  Physical Exam Physical Exam General:   alert  Skin:   no rash or abnormalities  Lungs:   clear to auscultation bilaterally  Heart:   regular rate and rhythm, S1, S2 normal, no murmur, click, rub or gallop  Breasts:   normal without suspicious masses, skin or nipple changes or axillary nodes  Abdomen:  normal findings: no organomegaly, soft, non-tender and no hernia  Pelvis:  External genitalia: normal general appearance Urinary system: urethral meatus normal and bladder without fullness, nontender Vaginal: normal without tenderness,  induration or masses Cervix: normal appearance Adnexa: normal bimanual exam Uterus: anteverted and non-tender, normal size    50% of 15 min visit spent on counseling and coordination of care.    Data Reviewed Labs  Assessment     AUB - Hormonal Imbalance Thrombocytopenia     Plan    Depo Provera Rx F/U 3 months  Orders Placed This Encounter  Procedures  . Urine culture

## 2016-07-26 NOTE — Progress Notes (Signed)
Patient is in the office for depo injection, related to abnormal bleeding, pt has hx of tubal, was seen by provider yesterday. Administered injection and pt tolerated well .Marland Kitchen Administrations This Visit    medroxyPROGESTERone (DEPO-PROVERA) injection 150 mg    Admin Date 07/26/2016 Action Given Dose 150 mg Route Intramuscular Administered By Katrina Stack, RN

## 2016-07-27 LAB — URINE CULTURE

## 2016-07-29 ENCOUNTER — Ambulatory Visit (HOSPITAL_BASED_OUTPATIENT_CLINIC_OR_DEPARTMENT_OTHER): Payer: Medicaid Other

## 2016-07-29 VITALS — BP 97/65 | HR 79 | Temp 99.2°F | Resp 16

## 2016-07-29 DIAGNOSIS — N920 Excessive and frequent menstruation with regular cycle: Secondary | ICD-10-CM | POA: Diagnosis present

## 2016-07-29 DIAGNOSIS — D500 Iron deficiency anemia secondary to blood loss (chronic): Secondary | ICD-10-CM

## 2016-07-29 MED ORDER — SODIUM CHLORIDE 0.9 % IV SOLN
Freq: Once | INTRAVENOUS | Status: AC
  Administered 2016-07-29: 10:00:00 via INTRAVENOUS

## 2016-07-29 MED ORDER — SODIUM CHLORIDE 0.9 % IV SOLN
510.0000 mg | Freq: Once | INTRAVENOUS | Status: AC
  Administered 2016-07-29: 510 mg via INTRAVENOUS
  Filled 2016-07-29: qty 17

## 2016-08-02 ENCOUNTER — Ambulatory Visit: Payer: Medicaid Other | Admitting: Obstetrics

## 2016-10-11 ENCOUNTER — Ambulatory Visit: Payer: Medicaid Other

## 2016-10-17 ENCOUNTER — Ambulatory Visit (INDEPENDENT_AMBULATORY_CARE_PROVIDER_SITE_OTHER): Payer: Medicaid Other

## 2016-10-17 DIAGNOSIS — Z3042 Encounter for surveillance of injectable contraceptive: Secondary | ICD-10-CM | POA: Diagnosis not present

## 2016-10-17 MED ORDER — MEDROXYPROGESTERONE ACETATE 150 MG/ML IM SUSP
150.0000 mg | Freq: Once | INTRAMUSCULAR | Status: AC
  Administered 2016-10-17: 150 mg via INTRAMUSCULAR

## 2016-10-17 NOTE — Progress Notes (Signed)
Pt here for a depo today. Pt tolerated well. Pt due for next depo 9/12 through 10/11

## 2016-11-12 ENCOUNTER — Telehealth: Payer: Self-pay | Admitting: *Deleted

## 2016-11-12 NOTE — Telephone Encounter (Signed)
Pt called to office stating she is having bleeding with Depo. Pt states that bleeding is usually light irregular. Pt would like to know if there are any recommendations at this time.  Pt has also been scheduled for STD testing next week.   Please advise on any changes for bleeding on depo.

## 2016-11-14 NOTE — Telephone Encounter (Signed)
Pt made aware

## 2016-11-14 NOTE — Telephone Encounter (Signed)
Will do STD testing next week, then make recommendation.   No action necessary at this time.

## 2016-11-20 ENCOUNTER — Other Ambulatory Visit (HOSPITAL_COMMUNITY)
Admission: RE | Admit: 2016-11-20 | Discharge: 2016-11-20 | Disposition: A | Discharge status: Home / Self Care | Payer: Medicaid Other | Source: Ambulatory Visit | Attending: Obstetrics | Admitting: Obstetrics

## 2016-11-20 ENCOUNTER — Ambulatory Visit (INDEPENDENT_AMBULATORY_CARE_PROVIDER_SITE_OTHER): Payer: Medicaid Other | Admitting: Obstetrics

## 2016-11-20 ENCOUNTER — Encounter: Payer: Self-pay | Admitting: Certified Nurse Midwife

## 2016-11-20 VITALS — BP 130/79 | HR 68 | Ht 64.0 in | Wt 132.0 lb

## 2016-11-20 DIAGNOSIS — N898 Other specified noninflammatory disorders of vagina: Secondary | ICD-10-CM | POA: Diagnosis not present

## 2016-11-20 DIAGNOSIS — Z113 Encounter for screening for infections with a predominantly sexual mode of transmission: Secondary | ICD-10-CM | POA: Diagnosis not present

## 2016-11-20 DIAGNOSIS — M62838 Other muscle spasm: Secondary | ICD-10-CM

## 2016-11-20 MED ORDER — CYCLOBENZAPRINE HCL 10 MG PO TABS: 10.0000 mg | ORAL_TABLET | Freq: Three times a day (TID) | ORAL | 1 refills | Status: DC | PRN

## 2016-11-20 NOTE — Progress Notes (Signed)
Patient ID: Emily Morse, female   DOB: 1986/03/30, 31 y.o.   MRN: [id number]  Chief Complaint  Patient presents with  . Gynecologic Exam    HPI Emily Fusangina N Stroebel is a 31 y.o. female.  History of positive Trichomonas infection.  Has vaginal discharge.  Suspect re-exposure.  Also has been having muscle spasms of right arm when menstruating. HPI  Past Medical History:  Diagnosis Date  . Anemia   . 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 Hx of gastroesophageal reflux (GERD)   . Thrombocytopenia (HCC)     Past Surgical History:  Procedure Laterality Date  . CESAREAN SECTION  10/22/2011   Procedure: CESAREAN SECTION;  Surgeon: Brock Badharles A Francesa Eugenio, MD;  Location: [facility];  Service: Gynecology;  Laterality: N/A;  . CESAREAN SECTION N/A 12/07/2013   Procedure: CESAREAN SECTION;  Surgeon: Brock Badharles A Nadelyn Enriques, MD;  Location: [facility];  Service: Obstetrics;  Laterality: N/A;  . CESAREAN SECTION N/A 12/05/2014   Procedure: CESAREAN SECTION;  Surgeon: Brock Badharles A Analeese Andreatta, MD;  Location: [facility];  Service: Obstetrics;  Laterality: N/A;  . DILATION AND CURETTAGE OF UTERUS      Family History  Problem Relation Age of Onset  . Other Neg Hx   . Alcohol abuse Neg Hx   . Arthritis Neg Hx   . Asthma Neg Hx   . Birth defects Neg Hx   . Cancer Neg Hx   . COPD Neg Hx   . Depression Neg Hx   . Diabetes Neg Hx   . Drug abuse Neg Hx   . Early death Neg Hx   . Hearing loss Neg Hx   . Heart disease Neg Hx   . Hyperlipidemia Neg Hx   . Hypertension Neg Hx   . Kidney disease Neg Hx   . Learning disabilities Neg Hx   . Mental illness Neg Hx   . Mental retardation Neg Hx   . Miscarriages / Stillbirths Neg Hx   . Stroke Neg Hx   . Vision loss Neg Hx   . Varicose Veins Neg Hx     Social History Social History  Substance Use Topics  . Smoking status: Former Smoker    Types: Cigars    Quit date: 05/06/2013  . Smokeless tobacco: Never Used     Comment: black and mild occasional  . Alcohol use 0.0 oz/week   Comment: Socially    No Known Allergies  Current Outpatient Prescriptions  Medication Sig Dispense Refill  . medroxyPROGESTERone (DEPO-PROVERA) 150 MG/ML injection Inject 1 mL (150 mg total) into the muscle every 3 (three) months. 1 mL 3  . cyclobenzaprine (FLEXERIL) 10 MG tablet Take 1 tablet (10 mg total) by mouth every 8 (eight) hours as needed for muscle spasms. 30 tablet 1  . valACYclovir (VALTREX) 1000 MG tablet Take 1 tablet 2 times daily for 3 days prn. (Patient not taking: Reported on 04/11/2016) 30 tablet prn   No current facility-administered medications for this visit.     Review of Systems Review of Systems Constitutional: negative for fatigue and weight loss Respiratory: negative for cough and wheezing Cardiovascular: negative for chest pain, fatigue and palpitations Gastrointestinal: negative for abdominal pain and change in bowel habits Genitourinary:positive for vaginal discharge Integument/breast: negative for nipple discharge Musculoskeletal:negative for myalgias Neurological: negative for gait problems and tremors Behavioral/Psych: negative for abusive relationship, depression Endocrine: negative for temperature intolerance      Blood pressure 130/79, pulse 68, height 5\' 4"  (1.626  m), weight 132 lb (59.9 kg), last menstrual period 11/01/2016, not currently breastfeeding.  Physical Exam Physical Exam           General:  Alert and no distress Abdomen:  normal findings: no organomegaly, soft, non-tender and no hernia  Pelvis:  External genitalia: normal general appearance Urinary system: urethral meatus normal and bladder without fullness, nontender Vaginal: normal without tenderness, induration or masses Cervix: normal appearance Adnexa: normal bimanual exam Uterus: anteverted and non-tender, normal size    50% of 15 min visit spent on counseling and coordination of care.    Data Reviewed Wet Prep and Cultures  Assessment and Plan:    1. Vaginal  discharge Rx: - Hepatitis B surface antigen - Hepatitis C antibody - HIV antibody - RPR - Cervicovaginal ancillary only  2. Screening for STD (sexually transmitted disease) Rx: - Hepatitis B surface antigen - Hepatitis C antibody - HIV antibody - RPR - Cervicovaginal ancillary only  3. Muscle spasm Rx: - cyclobenzaprine (FLEXERIL) 10 MG tablet; Take 1 tablet (10 mg total) by mouth every 8 (eight) hours as needed for muscle spasms.  Dispense: 30 tablet; Refill: 1     Plan    Follow up in 3 months for Annual and Pap  Orders Placed This Encounter  Procedures  . Hepatitis B surface antigen  . Hepatitis C antibody  . HIV antibody  . RPR   Meds ordered this encounter  Medications  . cyclobenzaprine (FLEXERIL) 10 MG tablet    Sig: Take 1 tablet (10 mg total) by mouth every 8 (eight) hours as needed for muscle spasms.    Dispense:  30 tablet    Refill:  1         Patient ID: Emily Fus, female   DOB: Jan 29, 1986, 31 y.o.   MRN: [id number]

## 2016-11-21 ENCOUNTER — Other Ambulatory Visit: Payer: Self-pay | Admitting: Obstetrics

## 2016-11-21 DIAGNOSIS — B9689 Other specified bacterial agents as the cause of diseases classified elsewhere: Secondary | ICD-10-CM

## 2016-11-21 DIAGNOSIS — N760 Acute vaginitis: Principal | ICD-10-CM

## 2016-11-21 LAB — CERVICOVAGINAL ANCILLARY ONLY
Bacterial vaginitis: POSITIVE — AB
Candida vaginitis: NEGATIVE
Chlamydia: NEGATIVE
Neisseria Gonorrhea: NEGATIVE
Trichomonas: NEGATIVE

## 2016-11-21 LAB — HEPATITIS C ANTIBODY: Hep C Virus Ab: 0.1 s/co ratio (ref 0.0–0.9)

## 2016-11-21 LAB — HEPATITIS B SURFACE ANTIGEN: Hepatitis B Surface Ag: NEGATIVE

## 2016-11-21 LAB — HIV ANTIBODY (ROUTINE TESTING W REFLEX): HIV Screen 4th Generation wRfx: NONREACTIVE

## 2016-11-21 LAB — RPR: RPR Ser Ql: NONREACTIVE

## 2016-11-21 MED ORDER — METRONIDAZOLE 500 MG PO TABS: 500.0000 mg | ORAL_TABLET | Freq: Two times a day (BID) | ORAL | 2 refills | Status: DC

## 2016-11-22 ENCOUNTER — Encounter: Payer: Self-pay | Admitting: *Deleted

## 2016-12-16 ENCOUNTER — Ambulatory Visit: Payer: Medicaid Other

## 2017-01-06 ENCOUNTER — Ambulatory Visit (INDEPENDENT_AMBULATORY_CARE_PROVIDER_SITE_OTHER): Payer: Medicaid Other | Admitting: Pediatrics

## 2017-01-06 ENCOUNTER — Other Ambulatory Visit: Payer: Self-pay | Admitting: Obstetrics

## 2017-01-06 DIAGNOSIS — R300 Dysuria: Secondary | ICD-10-CM

## 2017-01-06 DIAGNOSIS — Z3042 Encounter for surveillance of injectable contraceptive: Secondary | ICD-10-CM | POA: Diagnosis not present

## 2017-01-06 DIAGNOSIS — R319 Hematuria, unspecified: Secondary | ICD-10-CM

## 2017-01-06 LAB — POCT URINALYSIS DIPSTICK
Bilirubin, UA: NEGATIVE
Glucose, UA: NEGATIVE
Ketones, UA: NEGATIVE
Nitrite, UA: NEGATIVE
Spec Grav, UA: <=1.005 — AB (ref 1.010–1.025)
Urobilinogen, UA: 1 E.U./dL
pH, UA: 8 (ref 5.0–8.0)

## 2017-01-06 MED ORDER — MEDROXYPROGESTERONE ACETATE 150 MG/ML IM SUSP
150.0000 mg | Freq: Once | INTRAMUSCULAR | Status: AC
  Administered 2017-01-06: 150 mg via INTRAMUSCULAR

## 2017-01-06 MED ORDER — PHENAZOPYRIDINE HCL 200 MG PO TABS: 200.0000 mg | ORAL_TABLET | Freq: Three times a day (TID) | ORAL | 0 refills | Status: DC | PRN

## 2017-01-06 NOTE — Progress Notes (Signed)
SUBJECTIVE: Emily Morse is a 31 y.o. female who complains of urinary urgency and dysuria 7 days, without flank pain, fever, chills, or abnormal vaginal discharge or bleeding.   OBJECTIVE: Appears well, in no apparent distress.  Urine dipstick shows positive for WBC's, positive for RBC's, positive for protein and positive for leukocytes.    ASSESSMENT: Dysuria  PLAN: Treatment per verbal orders from Dr. Alysia Penna.  Call or return to clinic prn if these symptoms worsen or fail to improve as anticipated.

## 2017-01-06 NOTE — Progress Notes (Signed)
UCx sent. 

## 2017-01-08 ENCOUNTER — Telehealth: Payer: Self-pay

## 2017-01-08 ENCOUNTER — Other Ambulatory Visit: Payer: Self-pay | Admitting: Obstetrics

## 2017-01-08 DIAGNOSIS — N390 Urinary tract infection, site not specified: Secondary | ICD-10-CM

## 2017-01-08 MED ORDER — NITROFURANTOIN MONOHYD MACRO 100 MG PO CAPS: 100.0000 mg | ORAL_CAPSULE | Freq: Two times a day (BID) | ORAL | 0 refills | Status: DC

## 2017-01-08 NOTE — Telephone Encounter (Signed)
Contacted and advised of rx being sent to pharmacy by provider for uti

## 2017-01-08 NOTE — Telephone Encounter (Signed)
error 

## 2017-01-10 LAB — URINE CULTURE

## 2017-01-17 ENCOUNTER — Other Ambulatory Visit: Payer: Medicaid Other

## 2017-01-24 ENCOUNTER — Ambulatory Visit: Payer: Medicaid Other | Admitting: Hematology and Oncology

## 2017-01-24 ENCOUNTER — Ambulatory Visit: Payer: Medicaid Other

## 2017-02-06 ENCOUNTER — Encounter: Payer: Self-pay | Admitting: Obstetrics & Gynecology

## 2017-02-06 ENCOUNTER — Other Ambulatory Visit (HOSPITAL_COMMUNITY)
Admission: RE | Admit: 2017-02-06 | Discharge: 2017-02-06 | Disposition: A | Discharge status: Home / Self Care | Payer: Medicaid Other | Source: Ambulatory Visit | Attending: Obstetrics & Gynecology | Admitting: Obstetrics & Gynecology

## 2017-02-06 ENCOUNTER — Ambulatory Visit: Payer: Medicaid Other | Admitting: Obstetrics

## 2017-02-06 ENCOUNTER — Ambulatory Visit (INDEPENDENT_AMBULATORY_CARE_PROVIDER_SITE_OTHER): Payer: Medicaid Other | Admitting: Obstetrics & Gynecology

## 2017-02-06 VITALS — BP 126/82 | HR 68 | Ht 64.0 in | Wt 130.6 lb

## 2017-02-06 DIAGNOSIS — Z0000 Encounter for general adult medical examination without abnormal findings: Secondary | ICD-10-CM

## 2017-02-06 DIAGNOSIS — Z23 Encounter for immunization: Secondary | ICD-10-CM | POA: Diagnosis not present

## 2017-02-06 DIAGNOSIS — A6009 Herpesviral infection of other urogenital tract: Secondary | ICD-10-CM

## 2017-02-06 DIAGNOSIS — Z01419 Encounter for gynecological examination (general) (routine) without abnormal findings: Secondary | ICD-10-CM

## 2017-02-06 MED ORDER — VALACYCLOVIR HCL 1 G PO TABS: ORAL_TABLET | ORAL | 99 refills | Status: DC

## 2017-02-06 NOTE — Progress Notes (Signed)
Patient is in the office for annual, last pap 02-06-16. Patient requests std testing.

## 2017-02-06 NOTE — Progress Notes (Signed)
Subjective:    Emily Morse is a 31 y.o. S AA P3 (5, 3, and 29 yo kids) female who presents for an annual exam. The patient has no complaints today. The patient is not currently sexually active. GYN screening history: last pap: was normal. The patient wears seatbelts: yes. The patient participates in regular exercise: yes. Has the patient ever been transfused or tattooed?: yes. The patient reports that there is not domestic violence in her life.   Menstrual History: OB History    Gravida Para Term Preterm AB Living   5 3 3  0 2 3   SAB TAB Ectopic Multiple Live Births   2     0 3      Menarche age: [age] No LMP recorded. Patient has had an injection.    The following portions of the patient's history were reviewed and updated as appropriate: allergies, current medications, past family history, past medical history, past social history, past surgical history and problem list.  Review of Systems Pertinent items are noted in HPI.   FH- no breast/gyn/colon cancer Homemaker Uses condoms faithfully and she has had a BTL.   Objective:    BP 126/82   Pulse 68   Ht 5\' 4"  (1.626 m)   Wt 130 lb 9.6 oz (59.2 kg)   BMI 22.42 kg/m   General Appearance:    Alert, cooperative, no distress, appears stated age  Head:    Normocephalic, without obvious abnormality, atraumatic  Eyes:    PERRL, conjunctiva/corneas clear, EOM's intact, fundi    benign, both eyes  Ears:    Normal TM's and external ear canals, both ears  Nose:   Nares normal, septum midline, mucosa normal, no drainage    or sinus tenderness  Throat:   Lips, mucosa, and tongue normal; teeth and gums normal  Neck:   Supple, symmetrical, trachea midline, no adenopathy;    thyroid:  no enlargement/tenderness/nodules; no carotid   bruit or JVD  Back:     Symmetric, no curvature, ROM normal, no CVA tenderness  Lungs:     Clear to auscultation bilaterally, respirations unlabored  Chest Wall:    No tenderness or deformity   Heart:     Regular rate and rhythm, S1 and S2 normal, no murmur, rub   or gallop  Breast Exam:    No tenderness, masses, or nipple abnormality  Abdomen:     Soft, non-tender, bowel sounds active all four quadrants,    no masses, no organomegaly  Genitalia:    Normal female without lesion, discharge or tenderness, NSSA, NT, normal adnexal exam     Extremities:   Extremities normal, atraumatic, no cyanosis or edema  Pulses:   2+ and symmetric all extremities  Skin:   Skin color, texture, turgor normal, no rashes or lesions  Lymph nodes:   Cervical, supraclavicular, and axillary nodes normal  Neurologic:   CNII-XII intact, normal strength, sensation and reflexes    throughout  .    Assessment:    Healthy female exam.    Plan:     Chlamydia specimen. GC specimen. Thin prep Pap smear.   STI testing per patient request

## 2017-02-07 LAB — CYTOLOGY - PAP
Bacterial vaginitis: POSITIVE
Candida vaginitis: NEGATIVE
Chlamydia: NEGATIVE
Diagnosis: NEGATIVE
Neisseria Gonorrhea: NEGATIVE
Trichomonas: NEGATIVE

## 2017-02-07 LAB — HEPATITIS B SURFACE ANTIGEN: Hepatitis B Surface Ag: NEGATIVE

## 2017-02-07 LAB — HEPATITIS C ANTIBODY: Hep C Virus Ab: <0.1 s/co ratio (ref 0.0–0.9)

## 2017-02-07 LAB — RPR: RPR Ser Ql: NONREACTIVE

## 2017-02-07 LAB — HIV ANTIBODY (ROUTINE TESTING W REFLEX): HIV Screen 4th Generation wRfx: NONREACTIVE

## 2017-02-12 ENCOUNTER — Other Ambulatory Visit: Payer: Self-pay | Admitting: *Deleted

## 2017-02-12 DIAGNOSIS — B9689 Other specified bacterial agents as the cause of diseases classified elsewhere: Secondary | ICD-10-CM

## 2017-02-12 DIAGNOSIS — N760 Acute vaginitis: Principal | ICD-10-CM

## 2017-02-12 MED ORDER — METRONIDAZOLE 500 MG PO TABS: 500.0000 mg | ORAL_TABLET | Freq: Two times a day (BID) | ORAL | 0 refills | Status: DC

## 2017-02-12 NOTE — Progress Notes (Signed)
Flagyl ordered to pharmacy per Dr Marice Potterove. See lab note

## 2017-02-20 ENCOUNTER — Other Ambulatory Visit: Payer: Medicaid Other

## 2017-02-20 ENCOUNTER — Encounter: Payer: Self-pay | Admitting: Hematology and Oncology

## 2017-02-20 ENCOUNTER — Ambulatory Visit: Payer: Medicaid Other | Admitting: Hematology and Oncology

## 2017-03-24 ENCOUNTER — Ambulatory Visit (INDEPENDENT_AMBULATORY_CARE_PROVIDER_SITE_OTHER): Payer: Medicaid Other

## 2017-03-24 DIAGNOSIS — Z3042 Encounter for surveillance of injectable contraceptive: Secondary | ICD-10-CM | POA: Diagnosis not present

## 2017-03-24 MED ORDER — MEDROXYPROGESTERONE ACETATE 150 MG/ML IM SUSP
150.0000 mg | Freq: Once | INTRAMUSCULAR | Status: AC
  Administered 2017-03-24: 150 mg via INTRAMUSCULAR

## 2017-03-24 NOTE — Progress Notes (Signed)
Pt here for depo today. Last given depo 01/06/17. Inj given in left upper outer quad. Pt tolerated well.

## 2017-03-24 NOTE — Progress Notes (Signed)
Agree with nursing staff's documentation of this patient's clinic encounter.  Catalina AntiguaPeggy Jamas Jaquay, MD 03/24/2017 5:01 PM

## 2017-04-14 ENCOUNTER — Telehealth: Payer: Self-pay

## 2017-04-14 DIAGNOSIS — N390 Urinary tract infection, site not specified: Secondary | ICD-10-CM

## 2017-04-14 MED ORDER — NITROFURANTOIN MONOHYD MACRO 100 MG PO CAPS: 100.0000 mg | ORAL_CAPSULE | Freq: Two times a day (BID) | ORAL | 0 refills | Status: DC

## 2017-04-14 NOTE — Telephone Encounter (Signed)
Pt called stating that she just recently had a UTI. She states that she did not complete rx has prescribed, and would like rx sent to the pharmacy again because symptoms has returned. She is having pressure and pain with urination. Also complains of having urinary frequency. Rx sent to pharmacy

## 2017-05-29 ENCOUNTER — Encounter: Payer: Self-pay | Admitting: Obstetrics & Gynecology

## 2017-05-29 ENCOUNTER — Ambulatory Visit: Payer: Medicaid Other | Admitting: Obstetrics & Gynecology

## 2017-05-29 ENCOUNTER — Other Ambulatory Visit (HOSPITAL_COMMUNITY)
Admission: RE | Admit: 2017-05-29 | Discharge: 2017-05-29 | Disposition: A | Discharge status: Home / Self Care | Payer: Medicaid Other | Source: Ambulatory Visit | Attending: Obstetrics & Gynecology | Admitting: Obstetrics & Gynecology

## 2017-05-29 DIAGNOSIS — B9689 Other specified bacterial agents as the cause of diseases classified elsewhere: Secondary | ICD-10-CM | POA: Diagnosis not present

## 2017-05-29 DIAGNOSIS — N760 Acute vaginitis: Secondary | ICD-10-CM | POA: Diagnosis not present

## 2017-05-29 NOTE — Progress Notes (Signed)
Patient ID: Emily Morse, female   DOB: 08/17/1985, 32 y.o.   MRN: [id number]  Chief Complaint  Patient presents with  . Vaginal Discharge    x1 week, pt denies itching/irritation    HPI Emily Morse is a 32 y.o. female.  [id number] No LMP recorded. Patient has had an injection. One week with vaginal discharge and slight odor, no pain or itching HPI  Past Medical History:  Diagnosis Date  . Anemia   . 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 Hx of gastroesophageal reflux (GERD)   . Thrombocytopenia (HCC)     Past Surgical History:  Procedure Laterality Date  . CESAREAN SECTION  10/22/2011   Procedure: CESAREAN SECTION;  Surgeon: Brock Badharles A [name], MD;  Location: [facility];  Service: Gynecology;  Laterality: N/A;  . CESAREAN SECTION N/A 12/07/2013   Procedure: CESAREAN SECTION;  Surgeon: Brock Badharles A [name], MD;  Location: [facility];  Service: Obstetrics;  Laterality: N/A;  . CESAREAN SECTION N/A 12/05/2014   Procedure: CESAREAN SECTION;  Surgeon: Brock Badharles A [name], MD;  Location: [facility];  Service: Obstetrics;  Laterality: N/A;  . DILATION AND CURETTAGE OF UTERUS      Family History  Problem Relation Age of Onset  . Hypertension Mother   . Other Neg Hx   . Alcohol abuse Neg Hx   . Arthritis Neg Hx   . Asthma Neg Hx   . Birth defects Neg Hx   . Cancer Neg Hx   . COPD Neg Hx   . Depression Neg Hx   . Diabetes Neg Hx   . Drug abuse Neg Hx   . Early death Neg Hx   . Hearing loss Neg Hx   . Heart disease Neg Hx   . Hyperlipidemia Neg Hx   . Kidney disease Neg Hx   . Learning disabilities Neg Hx   . Mental illness Neg Hx   . Mental retardation Neg Hx   . Miscarriages / Stillbirths Neg Hx   . Stroke Neg Hx   . Vision loss Neg Hx   . Varicose Veins Neg Hx     Social History Social History   Tobacco Use  . Smoking status: Former Smoker    Types: Cigars    Last attempt to quit: 05/06/2013    Years since quitting: 4.0  . Smokeless tobacco: Never Used  . Tobacco comment: black and  mild occasional  Substance Use Topics  . Alcohol use: Yes    Alcohol/week: 0.0 oz    Comment: Socially  . Drug use: No    No Known Allergies  Current Outpatient Medications  Medication Sig Dispense Refill  . cyclobenzaprine (FLEXERIL) 10 MG tablet Take 1 tablet (10 mg total) by mouth every 8 (eight) hours as needed for muscle spasms. 30 tablet 1  . valACYclovir (VALTREX) 1000 MG tablet Take 1 tablet 2 times daily for 3 days prn. 30 tablet prn   No current facility-administered medications for this visit.     Review of Systems Review of Systems  Constitutional: Negative.   Gastrointestinal: Negative.   Genitourinary: Positive for vaginal discharge. Negative for dysuria, urgency, vaginal bleeding and vaginal pain.    There were no vitals taken for this visit.  Physical Exam Physical Exam  Constitutional: She appears well-developed. No distress.  Cardiovascular: Normal rate.  Genitourinary: Vagina normal. No vaginal discharge found.  Genitourinary Comments: Cervix normal  Psychiatric: She has a normal mood and affect. Her behavior is normal.  Data Reviewed Previous lab results  Assessment    Testing for vaginitis pending     Plan    Notify patient of lab results        Scheryl Darter 05/29/2017, 3:42 PM

## 2017-05-29 NOTE — Patient Instructions (Signed)

## 2017-05-30 LAB — CERVICOVAGINAL ANCILLARY ONLY
Bacterial vaginitis: POSITIVE — AB
Candida vaginitis: NEGATIVE
Chlamydia: NEGATIVE
Neisseria Gonorrhea: NEGATIVE
Trichomonas: NEGATIVE

## 2017-06-04 ENCOUNTER — Other Ambulatory Visit: Payer: Self-pay | Admitting: Obstetrics & Gynecology

## 2017-06-04 DIAGNOSIS — N920 Excessive and frequent menstruation with regular cycle: Secondary | ICD-10-CM

## 2017-06-04 MED ORDER — METRONIDAZOLE 500 MG PO TABS: 500.0000 mg | ORAL_TABLET | Freq: Two times a day (BID) | ORAL | 0 refills | Status: DC

## 2017-06-09 ENCOUNTER — Other Ambulatory Visit: Payer: Self-pay | Admitting: Obstetrics

## 2017-06-09 ENCOUNTER — Telehealth: Payer: Self-pay

## 2017-06-09 DIAGNOSIS — N939 Abnormal uterine and vaginal bleeding, unspecified: Secondary | ICD-10-CM

## 2017-06-09 NOTE — Telephone Encounter (Addendum)
Received Depo rx request. Last annual 02/2017. Rf's approved per protocol.

## 2017-06-10 ENCOUNTER — Ambulatory Visit (INDEPENDENT_AMBULATORY_CARE_PROVIDER_SITE_OTHER): Payer: Medicaid Other

## 2017-06-10 DIAGNOSIS — Z3042 Encounter for surveillance of injectable contraceptive: Secondary | ICD-10-CM

## 2017-06-10 MED ORDER — MEDROXYPROGESTERONE ACETATE 150 MG/ML IM SUSP
150.0000 mg | INTRAMUSCULAR | Status: DC
  Administered 2017-06-10 – 2020-05-01 (×8): 150 mg via INTRAMUSCULAR

## 2017-06-10 NOTE — Progress Notes (Signed)
Patient is in the office for depo injection, administered and pt tolerated well .Marland Kitchen. Administrations This Visit    medroxyPROGESTERone (DEPO-PROVERA) injection 150 mg    Admin Date 06/10/2017 Action Given Dose 150 mg Route Intramuscular Administered By Katrina StackStalling, Brennan Litzinger D, RN

## 2017-06-11 ENCOUNTER — Ambulatory Visit: Payer: Medicaid Other

## 2017-06-12 ENCOUNTER — Ambulatory Visit: Payer: Medicaid Other

## 2017-06-25 ENCOUNTER — Other Ambulatory Visit: Payer: Self-pay

## 2017-06-25 DIAGNOSIS — N390 Urinary tract infection, site not specified: Secondary | ICD-10-CM

## 2017-06-25 MED ORDER — NITROFURANTOIN MONOHYD MACRO 100 MG PO CAPS: 100.0000 mg | ORAL_CAPSULE | Freq: Two times a day (BID) | ORAL | 0 refills | Status: DC

## 2017-06-25 NOTE — Progress Notes (Signed)
Pt c/o UTI Consulted w/ Dr.[name]  Rx sent pt made aware.  Pt voiced understanding if no relief after treatment will need appt.

## 2017-08-13 ENCOUNTER — Encounter: Payer: Self-pay | Admitting: Obstetrics

## 2017-08-13 ENCOUNTER — Ambulatory Visit (INDEPENDENT_AMBULATORY_CARE_PROVIDER_SITE_OTHER): Payer: Medicaid Other | Admitting: Obstetrics

## 2017-08-13 ENCOUNTER — Other Ambulatory Visit (HOSPITAL_COMMUNITY)
Admission: RE | Admit: 2017-08-13 | Discharge: 2017-08-13 | Disposition: A | Discharge status: Home / Self Care | Payer: Medicaid Other | Source: Ambulatory Visit | Attending: Obstetrics | Admitting: Obstetrics

## 2017-08-13 VITALS — BP 126/81 | HR 80 | Temp 98.8°F | Resp 16 | Wt 134.4 lb

## 2017-08-13 DIAGNOSIS — N898 Other specified noninflammatory disorders of vagina: Secondary | ICD-10-CM

## 2017-08-13 DIAGNOSIS — Z3042 Encounter for surveillance of injectable contraceptive: Secondary | ICD-10-CM

## 2017-08-13 DIAGNOSIS — N939 Abnormal uterine and vaginal bleeding, unspecified: Secondary | ICD-10-CM

## 2017-08-13 MED ORDER — SECNIDAZOLE 2 G PO PACK: 1.0000 | PACK | Freq: Once | ORAL | 2 refills | Status: AC

## 2017-08-13 NOTE — Progress Notes (Addendum)
Subjective:    Emily Morse is a 32 y.o. female who presents for contraception surveillace. The patient has been having irregular vaginal bleeding for the past 2 months. The patient is sexually active. Pertinent past medical history: none.  The information documented in the HPI was reviewed and verified.  Menstrual History: OB History    Gravida  5   Para  3   Term  3   Preterm  0   AB  2   Living  3     SAB  2   TAB      Ectopic      Multiple  0   Live Births  3           No LMP recorded. Patient has had an injection.   Patient Active Problem List   Diagnosis Date Noted  . Poor dentition 04/12/2016  . Other fatigue 04/12/2016  . Multiple somatic complaints 04/12/2016  . Iron deficiency anemia due to chronic blood loss 04/11/2016  . Menorrhagia with regular cycle 04/11/2016  . Vitamin D deficiency 04/11/2016  . Thrombocytopenia (HCC) 02/18/2014  . H/O thrombocytopenia 08/04/2013  . GERD without esophagitis 08/04/2013   Past Medical History:  Diagnosis Date  . Anemia   . 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 Hx of gastroesophageal reflux (GERD)   . Thrombocytopenia (HCC)     Past Surgical History:  Procedure Laterality Date  . CESAREAN SECTION  10/22/2011   Procedure: CESAREAN SECTION;  Surgeon: Brock Bad, MD;  Location: [facility];  Service: Gynecology;  Laterality: N/A;  . CESAREAN SECTION N/A 12/07/2013   Procedure: CESAREAN SECTION;  Surgeon: Brock Bad, MD;  Location: [facility];  Service: Obstetrics;  Laterality: N/A;  . CESAREAN SECTION N/A 12/05/2014   Procedure: CESAREAN SECTION;  Surgeon: Brock Bad, MD;  Location: [facility];  Service: Obstetrics;  Laterality: N/A;  . DILATION AND CURETTAGE OF UTERUS       Current Outpatient Medications:  .  cyclobenzaprine (FLEXERIL) 10 MG tablet, Take 1 tablet (10 mg total) by mouth every 8 (eight) hours as needed for muscle spasms., Disp: 30 tablet, Rfl: 1 .  medroxyPROGESTERone (DEPO-PROVERA) 150  MG/ML injection, INJECT 1 ML (150 MG TOTAL) INTO THE MUSCLE EVERY 3 (THREE) MONTHS., Disp: 1 mL, Rfl: 3 .  valACYclovir (VALTREX) 1000 MG tablet, Take 1 tablet 2 times daily for 3 days prn., Disp: 30 tablet, Rfl: prn .  metroNIDAZOLE (FLAGYL) 500 MG tablet, Take 1 tablet (500 mg total) by mouth 2 (two) times daily. (Patient not taking: Reported on 08/13/2017), Disp: 14 tablet, Rfl: 0 .  nitrofurantoin, macrocrystal-monohydrate, (MACROBID) 100 MG capsule, Take 1 capsule (100 mg total) by mouth 2 (two) times daily. (Patient not taking: Reported on 08/13/2017), Disp: 14 capsule, Rfl: 0 .  Secnidazole (SOLOSEC) 2 g PACK, Take 1 packet by mouth once for 1 dose. Mix as directed with yogurt, pudding, applesauce, etc., Disp: 1 each, Rfl: 2  Current Facility-Administered Medications:  .  medroxyPROGESTERone (DEPO-PROVERA) injection 150 mg, 150 mg, Intramuscular, Q90 days, Brock Bad, MD, 150 mg at 06/10/17 1329 No Known Allergies  Social History   Tobacco Use  . Smoking status: Former Smoker    Types: Cigars    Last attempt to quit: 05/06/2013    Years since quitting: 4.2  . Smokeless tobacco: Never Used  . Tobacco comment: black and mild occasional  Substance Use Topics  . Alcohol use: Yes  Alcohol/week: 0.0 oz    Comment: Socially    Family History  Problem Relation Age of Onset  . Hypertension Mother   . Other Neg Hx   . Alcohol abuse Neg Hx   . Arthritis Neg Hx   . Asthma Neg Hx   . Birth defects Neg Hx   . Cancer Neg Hx   . COPD Neg Hx   . Depression Neg Hx   . Diabetes Neg Hx   . Drug abuse Neg Hx   . Early death Neg Hx   . Hearing loss Neg Hx   . Heart disease Neg Hx   . Hyperlipidemia Neg Hx   . Kidney disease Neg Hx   . Learning disabilities Neg Hx   . Mental illness Neg Hx   . Mental retardation Neg Hx   . Miscarriages / Stillbirths Neg Hx   . Stroke Neg Hx   . Vision loss Neg Hx   . Varicose Veins Neg Hx        Review of Systems Constitutional: negative for  weight loss Genitourinary:positive for abnormal menstrual periods and vaginal discharge   Objective:   BP 126/81 (BP Location: Left Arm, Cuff Size: Large)   Pulse 80   Temp 98.8 F (37.1 C) (Oral)   Resp 16   Wt 134 lb 6.4 oz (61 kg)   BMI 23.07 kg/m           General:  Alert and no distress Abdomen:  normal findings: no organomegaly, soft, non-tender and no hernia  Pelvis:  External genitalia: normal general appearance Urinary system: urethral meatus normal and bladder without fullness, nontender Vaginal: normal without tenderness, induration or masses Cervix: normal appearance Adnexa: normal bimanual exam Uterus: anteverted and non-tender, normal size   Lab Review Urine pregnancy test Labs reviewed yes Radiologic studies reviewed yes  50% of 15 min visit spent on counseling and coordination of care.    Assessment:    32 y.o., continuing Depo-Provera injections, no contraindications.   Plan:    All questions answered. Chlamydia specimen. Contraception: tubal ligation. Diagnosis explained in detail, including differential. Follow up in 2 months. GC specimen. Wet prep.    Meds ordered this encounter  Medications  . Secnidazole (SOLOSEC) 2 g PACK    Sig: Take 1 packet by mouth once for 1 dose. Mix as directed with yogurt, pudding, applesauce, etc.    Dispense:  1 each    Refill:  2   No orders of the defined types were placed in this encounter.   Brock Bad MD 08-13-2017

## 2017-08-13 NOTE — Addendum Note (Signed)
Addended by: Tim Lair on: 08/13/2017 02:35 PM   Modules accepted: Orders

## 2017-08-14 LAB — CERVICOVAGINAL ANCILLARY ONLY
Bacterial vaginitis: POSITIVE — AB
Candida vaginitis: NEGATIVE
Chlamydia: NEGATIVE
Neisseria Gonorrhea: POSITIVE — AB
Trichomonas: NEGATIVE

## 2017-08-15 ENCOUNTER — Other Ambulatory Visit: Payer: Self-pay

## 2017-08-15 ENCOUNTER — Other Ambulatory Visit: Payer: Self-pay | Admitting: Obstetrics

## 2017-08-15 ENCOUNTER — Telehealth: Payer: Self-pay

## 2017-08-15 DIAGNOSIS — A5403 Gonococcal cervicitis, unspecified: Secondary | ICD-10-CM

## 2017-08-15 MED ORDER — AZITHROMYCIN 500 MG PO TABS: 1000.0000 mg | ORAL_TABLET | Freq: Once | ORAL | 0 refills | Status: AC

## 2017-08-15 MED ORDER — CEFTRIAXONE SODIUM 250 MG IJ SOLR
250.0000 mg | Freq: Once | INTRAMUSCULAR | Status: AC
  Administered 2017-08-18: 250 mg via INTRAMUSCULAR

## 2017-08-15 NOTE — Telephone Encounter (Signed)
S/w patient and advised of results, rx,and informed that scheduler would call with appt for injection.

## 2017-08-15 NOTE — Progress Notes (Signed)
Faxed form to [facility] 

## 2017-08-18 ENCOUNTER — Ambulatory Visit (INDEPENDENT_AMBULATORY_CARE_PROVIDER_SITE_OTHER): Payer: Medicaid Other

## 2017-08-18 DIAGNOSIS — A549 Gonococcal infection, unspecified: Secondary | ICD-10-CM

## 2017-08-18 MED ORDER — CEFTRIAXONE SODIUM 1 G IJ SOLR: 250.0000 mg | Freq: Once | INTRAMUSCULAR | Status: DC

## 2017-08-18 NOTE — Progress Notes (Signed)
Nurse visit for office supply Rocephin diluted w/ Lidocaine, given L upper outer quad w/o difficulty. Dx: GC. Pt states she has completed 1 gm Azithromycin for CT as well. Reiterated with pt no IC until both (hers and partner's) TOC's is neg. TOC scheduled next month.

## 2017-09-03 ENCOUNTER — Ambulatory Visit: Payer: Medicaid Other

## 2017-09-09 ENCOUNTER — Ambulatory Visit (INDEPENDENT_AMBULATORY_CARE_PROVIDER_SITE_OTHER): Payer: Medicaid Other

## 2017-09-09 DIAGNOSIS — Z3042 Encounter for surveillance of injectable contraceptive: Secondary | ICD-10-CM | POA: Diagnosis not present

## 2017-09-09 MED ORDER — MEDROXYPROGESTERONE ACETATE 150 MG/ML IM SUSP
150.0000 mg | Freq: Once | INTRAMUSCULAR | Status: AC
  Administered 2017-09-09: 150 mg via INTRAMUSCULAR

## 2017-09-09 NOTE — Progress Notes (Signed)
Pt here for depo shot. Pt is within her window. Inj given RUOQ. Pt tolerated well. Next depo due 8/20-9/3

## 2017-09-09 NOTE — Progress Notes (Signed)
I have reviewed this chart and agree with the RN/CMA assessment and management.    K. Meryl Thara Searing, M.D. Attending Obstetrician & Gynecologist, [facility], [facility]  

## 2017-09-15 ENCOUNTER — Ambulatory Visit: Payer: Medicaid Other | Admitting: Obstetrics

## 2017-12-02 ENCOUNTER — Ambulatory Visit: Payer: Medicaid Other

## 2017-12-03 ENCOUNTER — Ambulatory Visit (INDEPENDENT_AMBULATORY_CARE_PROVIDER_SITE_OTHER): Payer: Medicaid Other

## 2017-12-03 DIAGNOSIS — Z3042 Encounter for surveillance of injectable contraceptive: Secondary | ICD-10-CM

## 2017-12-03 NOTE — Progress Notes (Signed)
Agree with A & P. 

## 2017-12-03 NOTE — Progress Notes (Signed)
Nurse visit for pt supplied Depo given LUOQ w/o difficulty. Next Depo due Nov 12-27, pt agrees.

## 2017-12-31 ENCOUNTER — Other Ambulatory Visit: Payer: Self-pay

## 2017-12-31 MED ORDER — SULFAMETHOXAZOLE-TRIMETHOPRIM 800-160 MG PO TABS: 1.0000 | ORAL_TABLET | Freq: Two times a day (BID) | ORAL | 0 refills | Status: AC

## 2017-12-31 MED ORDER — PHENAZOPYRIDINE HCL 200 MG PO TABS: 200.0000 mg | ORAL_TABLET | Freq: Three times a day (TID) | ORAL | 0 refills | Status: AC | PRN

## 2017-12-31 NOTE — Progress Notes (Signed)
Pt c/o urinary frequency with dysuria. Allergies verified, pt not pregnant, Depo is UTD. Septra DS 1 tab po BID x 3 days sent to the pharmacy with Pyridium 200 mg po TID x 2 days for pain per protocol. Pt aware if sx's do not subside after completing txt, to contact the office for an appt.

## 2018-02-18 ENCOUNTER — Ambulatory Visit: Payer: Medicaid Other

## 2018-03-02 ENCOUNTER — Ambulatory Visit (INDEPENDENT_AMBULATORY_CARE_PROVIDER_SITE_OTHER): Payer: Medicaid Other

## 2018-03-02 VITALS — BP 140/91 | HR 70 | Ht 64.0 in | Wt 138.4 lb

## 2018-03-02 DIAGNOSIS — Z3042 Encounter for surveillance of injectable contraceptive: Secondary | ICD-10-CM | POA: Diagnosis not present

## 2018-03-02 MED ORDER — MEDROXYPROGESTERONE ACETATE 150 MG/ML IM SUSP
150.0000 mg | Freq: Once | INTRAMUSCULAR | Status: AC
  Administered 2018-03-02: 150 mg via INTRAMUSCULAR

## 2018-03-02 NOTE — Progress Notes (Addendum)
Presents for DEPO, given in LUOQ, tolerated well.  Next DEPO Feb. 10-24/20  Patient needs Annual Exam before next Depo.   Administrations This Visit    medroxyPROGESTERone (DEPO-PROVERA) injection 150 mg    Admin Date 03/02/2018 Action Given Dose 150 mg Route Intramuscular Administered By Maretta BeesMcGlashan, Holiday Mcmenamin J, RMA

## 2018-03-12 ENCOUNTER — Ambulatory Visit: Payer: Medicaid Other | Admitting: Obstetrics

## 2018-03-19 ENCOUNTER — Ambulatory Visit: Payer: Medicaid Other | Admitting: Obstetrics

## 2018-04-03 ENCOUNTER — Encounter: Payer: Self-pay | Admitting: Obstetrics

## 2018-04-03 ENCOUNTER — Other Ambulatory Visit (HOSPITAL_COMMUNITY)
Admission: RE | Admit: 2018-04-03 | Discharge: 2018-04-03 | Disposition: A | Discharge status: Home / Self Care | Payer: Medicaid Other | Source: Ambulatory Visit | Attending: Obstetrics | Admitting: Obstetrics

## 2018-04-03 ENCOUNTER — Ambulatory Visit (INDEPENDENT_AMBULATORY_CARE_PROVIDER_SITE_OTHER): Payer: Medicaid Other | Admitting: Obstetrics

## 2018-04-03 VITALS — BP 124/77 | HR 90 | Ht 64.0 in | Wt 140.5 lb

## 2018-04-03 DIAGNOSIS — Z23 Encounter for immunization: Secondary | ICD-10-CM | POA: Diagnosis not present

## 2018-04-03 DIAGNOSIS — Z113 Encounter for screening for infections with a predominantly sexual mode of transmission: Secondary | ICD-10-CM

## 2018-04-03 DIAGNOSIS — Z01419 Encounter for gynecological examination (general) (routine) without abnormal findings: Secondary | ICD-10-CM

## 2018-04-03 DIAGNOSIS — Z0000 Encounter for general adult medical examination without abnormal findings: Secondary | ICD-10-CM | POA: Diagnosis not present

## 2018-04-03 DIAGNOSIS — N898 Other specified noninflammatory disorders of vagina: Secondary | ICD-10-CM

## 2018-04-03 DIAGNOSIS — L738 Other specified follicular disorders: Secondary | ICD-10-CM

## 2018-04-03 DIAGNOSIS — Z3042 Encounter for surveillance of injectable contraceptive: Secondary | ICD-10-CM

## 2018-04-03 MED ORDER — FLUCONAZOLE 150 MG PO TABS: 150.0000 mg | ORAL_TABLET | Freq: Once | ORAL | 2 refills | Status: AC

## 2018-04-03 MED ORDER — MEDROXYPROGESTERONE ACETATE 150 MG/ML IM SUSP: 150.0000 mg | INTRAMUSCULAR | 3 refills | Status: DC

## 2018-04-03 MED ORDER — CLINDAMYCIN HCL 300 MG PO CAPS: 300.0000 mg | ORAL_CAPSULE | Freq: Three times a day (TID) | ORAL | 0 refills | Status: DC

## 2018-04-03 NOTE — Progress Notes (Signed)
Pt presents for annual, pap, and all STD screening.  She needs rf on Depo injections.  Pt requests Flu Vaccine today given L Del w/o difficulty.

## 2018-04-03 NOTE — Progress Notes (Signed)
Subjective:        Emily Morse is a 32 y.o. female here for a routine exam.  Current complaints: " Hair Bumps " from shaving.  Also has difficulty staying asleep.    Personal health questionnaire:  Is patient Ashkenazi Jewish, have a family history of breast and/or ovarian cancer: no Is there a family history of uterine cancer diagnosed at age < 2950, gastrointestinal cancer, urinary tract cancer, family member who is a Personnel officerLynch syndrome-associated carrier: no Is the patient overweight and hypertensive, family history of diabetes, personal history of gestational diabetes, preeclampsia or PCOS: no Is patient over 2955, have PCOS,  family history of premature CHD under age 30, diabetes, smoke, have hypertension or peripheral artery disease:  no At any time, has a partner hit, kicked or otherwise hurt or frightened you?: no Over the past 2 weeks, have you felt down, depressed or hopeless?: no Over the past 2 weeks, have you felt little interest or pleasure in doing things?:no   Gynecologic History No LMP recorded. Patient has had an injection. Contraception: Depo-Provera injections Last Pap: 2018. Results were: normal Last mammogram: n/a. Results were: n/a  Obstetric History OB History  Gravida Para Term Preterm AB Living  5 3 3  0 2 3  SAB TAB Ectopic Multiple Live Births  2     0 3    # Outcome Date GA Lbr Len/2nd Weight Sex Delivery Anes PTL Lv  5 Term 12/05/14 4912w2d  7 lb 13.8 oz (3.566 kg) M CS-LTranv Gen  LIV  4 Term 12/07/13 3382w2d  8 lb 0.6 oz (3.646 kg) M CS-LTranv Gen  LIV  3 Term 10/22/11 1462w2d  7 lb 9.9 oz (3.455 kg) F CS-LTranv EPI  LIV  2 SAB 2010 7460w0d            Birth Comments: neural tube defect iufd  1 SAB 2008 2052w0d           Past Medical History:  Diagnosis Date  . Anemia   . 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 Hx of gastroesophageal reflux (GERD)   . Thrombocytopenia (HCC)     Past Surgical History:  Procedure Laterality Date  . CESAREAN SECTION   10/22/2011   Procedure: CESAREAN SECTION;  Surgeon: Brock Badharles A Shown Dissinger, MD;  Location: [facility];  Service: Gynecology;  Laterality: N/A;  . CESAREAN SECTION N/A 12/07/2013   Procedure: CESAREAN SECTION;  Surgeon: Brock Badharles A Eeshan Verbrugge, MD;  Location: [facility];  Service: Obstetrics;  Laterality: N/A;  . CESAREAN SECTION N/A 12/05/2014   Procedure: CESAREAN SECTION;  Surgeon: Brock Badharles A Jennfier Abdulla, MD;  Location: [facility];  Service: Obstetrics;  Laterality: N/A;  . DILATION AND CURETTAGE OF UTERUS       Current Outpatient Medications:  .  medroxyPROGESTERone (DEPO-PROVERA) 150 MG/ML injection, Inject 1 mL (150 mg total) into the muscle every 3 (three) months., Disp: 1 mL, Rfl: 3 .  clindamycin (CLEOCIN) 300 MG capsule, Take 1 capsule (300 mg total) by mouth 3 (three) times daily., Disp: 21 capsule, Rfl: 0 .  cyclobenzaprine (FLEXERIL) 10 MG tablet, Take 1 tablet (10 mg total) by mouth every 8 (eight) hours as needed for muscle spasms. (Patient not taking: Reported on 04/03/2018), Disp: 30 tablet, Rfl: 1 .  fluconazole (DIFLUCAN) 150 MG tablet, Take 1 tablet (150 mg total) by mouth once for 1 dose., Disp: 1 tablet, Rfl: 2 .  metroNIDAZOLE (FLAGYL) 500 MG tablet, Take 1 tablet (500 mg total) by  mouth 2 (two) times daily. (Patient not taking: Reported on 08/13/2017), Disp: 14 tablet, Rfl: 0 .  nitrofurantoin, macrocrystal-monohydrate, (MACROBID) 100 MG capsule, Take 1 capsule (100 mg total) by mouth 2 (two) times daily. (Patient not taking: Reported on 08/13/2017), Disp: 14 capsule, Rfl: 0 .  valACYclovir (VALTREX) 1000 MG tablet, Take 1 tablet 2 times daily for 3 days prn. (Patient not taking: Reported on 04/03/2018), Disp: 30 tablet, Rfl: prn  Current Facility-Administered Medications:  .  medroxyPROGESTERone (DEPO-PROVERA) injection 150 mg, 150 mg, Intramuscular, Q90 days, Brock Bad, MD, 150 mg at 12/03/17 1037 No Known Allergies  Social History   Tobacco Use  . Smoking status: Former Smoker    Types: Cigars     Last attempt to quit: 05/06/2013    Years since quitting: 4.9  . Smokeless tobacco: Never Used  . Tobacco comment: black and mild occasional  Substance Use Topics  . Alcohol use: Yes    Alcohol/week: 0.0 standard drinks    Comment: Socially    Family History  Problem Relation Age of Onset  . Hypertension Mother   . Other Neg Hx   . Alcohol abuse Neg Hx   . Arthritis Neg Hx   . Asthma Neg Hx   . Birth defects Neg Hx   . Cancer Neg Hx   . COPD Neg Hx   . Depression Neg Hx   . Diabetes Neg Hx   . Drug abuse Neg Hx   . Early death Neg Hx   . Hearing loss Neg Hx   . Heart disease Neg Hx   . Hyperlipidemia Neg Hx   . Kidney disease Neg Hx   . Learning disabilities Neg Hx   . Mental illness Neg Hx   . Mental retardation Neg Hx   . Miscarriages / Stillbirths Neg Hx   . Stroke Neg Hx   . Vision loss Neg Hx   . Varicose Veins Neg Hx       Review of Systems  Constitutional: negative for fatigue and weight loss Respiratory: negative for cough and wheezing Cardiovascular: negative for chest pain, fatigue and palpitations Gastrointestinal: negative for abdominal pain and change in bowel habits Musculoskeletal:negative for myalgias Neurological: negative for gait problems and tremors Behavioral/Psych: negative for abusive relationship, depression Endocrine: negative for temperature intolerance    Genitourinary:negative for abnormal menstrual periods, genital lesions, hot flashes, sexual problems and vaginal discharge Integument/breast: negative for breast lump, breast tenderness, nipple discharge and skin lesion(s)    Objective:       BP 124/77   Pulse 90   Ht 5\' 4"  (1.626 m)   Wt 140 lb 8 oz (63.7 kg)   BMI 24.12 kg/m  General:   alert  Skin:   no rash or abnormalities  Lungs:   clear to auscultation bilaterally  Heart:   regular rate and rhythm, S1, S2 normal, no murmur, click, rub or gallop  Breasts:   normal without suspicious masses, skin or nipple changes or  axillary nodes  Abdomen:  normal findings: no organomegaly, soft, non-tender and no hernia  Pelvis:  External genitalia: normal general appearance Urinary system: urethral meatus normal and bladder without fullness, nontender Vaginal: normal without tenderness, induration or masses Cervix: normal appearance Adnexa: normal bimanual exam Uterus: anteverted and non-tender, normal size   Lab Review Urine pregnancy test Labs reviewed yes Radiologic studies reviewed no  50% of 20 min visit spent on counseling and coordination of care.   Assessment:  1. Encounter for routine gynecological examination with Papanicolaou smear of cervix Rx: - Cytology - PAP( [facility])  2. Vaginal discharge Rx: - fluconazole (DIFLUCAN) 150 MG tablet; Take 1 tablet (150 mg total) by mouth once for 1 dose.  Dispense: 1 tablet; Refill: 2 - Cervicovaginal ancillary only  3. Folliculitis barbae Rx: - clindamycin (CLEOCIN) 300 MG capsule; Take 1 capsule (300 mg total) by mouth 3 (three) times daily.  Dispense: 21 capsule; Refill: 0  4. Screening for STD (sexually transmitted disease) Rx: - Hepatitis B surface antigen - Hepatitis C antibody - HIV Antibody (routine testing w rflx) - RPR - Flu Vaccine QUAD 36+ mos IM (Fluarix, Quad PF)  5. Encounter for surveillance of injectable contraceptive Rx: - medroxyPROGESTERone (DEPO-PROVERA) 150 MG/ML injection; Inject 1 mL (150 mg total) into the muscle every 3 (three) months.  Dispense: 1 mL; Refill: 3  6. Routine adult health maintenance    Plan:    Education reviewed: calcium supplements, depression evaluation, low fat, low cholesterol diet, safe sex/STD prevention, self breast exams and weight bearing exercise. Contraception: Depo-Provera injections. Follow up in: 1 year.   Meds ordered this encounter  Medications  . medroxyPROGESTERone (DEPO-PROVERA) 150 MG/ML injection    Sig: Inject 1 mL (150 mg total) into the muscle every 3 (three) months.     Dispense:  1 mL    Refill:  3    DX Code Needed  .  . clindamycin (CLEOCIN) 300 MG capsule    Sig: Take 1 capsule (300 mg total) by mouth 3 (three) times daily.    Dispense:  21 capsule    Refill:  0  . fluconazole (DIFLUCAN) 150 MG tablet    Sig: Take 1 tablet (150 mg total) by mouth once for 1 dose.    Dispense:  1 tablet    Refill:  2   Orders Placed This Encounter  Procedures  . Flu Vaccine QUAD 36+ mos IM (Fluarix, Quad PF)  . Hepatitis B surface antigen  . Hepatitis C antibody  . HIV Antibody (routine testing w rflx)  . RPR    Brock BadHARLES A. Morna Flud MD 04-03-2018

## 2018-04-04 LAB — HIV ANTIBODY (ROUTINE TESTING W REFLEX): HIV Screen 4th Generation wRfx: NONREACTIVE

## 2018-04-04 LAB — HEPATITIS B SURFACE ANTIGEN: Hepatitis B Surface Ag: NEGATIVE

## 2018-04-04 LAB — HEPATITIS C ANTIBODY: Hep C Virus Ab: <0.1 s/co ratio (ref 0.0–0.9)

## 2018-04-04 LAB — RPR: RPR Ser Ql: NONREACTIVE

## 2018-04-06 LAB — CERVICOVAGINAL ANCILLARY ONLY
Bacterial vaginitis: NEGATIVE
Candida vaginitis: NEGATIVE
Chlamydia: NEGATIVE
Neisseria Gonorrhea: NEGATIVE
Trichomonas: NEGATIVE

## 2018-04-09 LAB — CYTOLOGY - PAP
Diagnosis: NEGATIVE
HPV 16/18/45 genotyping: NEGATIVE
HPV: DETECTED — AB

## 2018-04-17 ENCOUNTER — Telehealth: Payer: Self-pay

## 2018-04-17 DIAGNOSIS — N390 Urinary tract infection, site not specified: Secondary | ICD-10-CM

## 2018-04-17 MED ORDER — NITROFURANTOIN MONOHYD MACRO 100 MG PO CAPS: 100.0000 mg | ORAL_CAPSULE | Freq: Two times a day (BID) | ORAL | 0 refills | Status: AC

## 2018-04-17 MED ORDER — PHENAZOPYRIDINE HCL 200 MG PO TABS: 200.0000 mg | ORAL_TABLET | Freq: Three times a day (TID) | ORAL | 0 refills | Status: AC | PRN

## 2018-04-17 NOTE — Telephone Encounter (Signed)
Pt called requesting a rx for UTI. She states that she is having burning with urination and urinary frequency with cloudy urine. Per protocol rx sent to the pharmacy.

## 2018-05-26 ENCOUNTER — Ambulatory Visit: Payer: Medicaid Other

## 2018-05-29 ENCOUNTER — Ambulatory Visit: Payer: Medicaid Other

## 2018-06-01 ENCOUNTER — Ambulatory Visit (INDEPENDENT_AMBULATORY_CARE_PROVIDER_SITE_OTHER): Payer: Medicaid Other

## 2018-06-01 VITALS — BP 124/82 | HR 81 | Ht 64.0 in | Wt 145.9 lb

## 2018-06-01 DIAGNOSIS — Z3042 Encounter for surveillance of injectable contraceptive: Secondary | ICD-10-CM | POA: Diagnosis not present

## 2018-06-01 MED ORDER — MEDROXYPROGESTERONE ACETATE 150 MG/ML IM SUSP
150.0000 mg | Freq: Once | INTRAMUSCULAR | Status: AC
  Administered 2018-06-01: 150 mg via INTRAMUSCULAR

## 2018-06-01 NOTE — Progress Notes (Signed)
Presents for DEPO, given in RUOQ, tolerated well.  Next DEPO May 12-26, 2020.  Administrations This Visit    medroxyPROGESTERone (DEPO-PROVERA) injection 150 mg    Admin Date 06/01/2018 Action Given Dose 150 mg Route Intramuscular Administered By Nadie Fiumara J, RMA         

## 2018-06-01 NOTE — Addendum Note (Signed)
Addended by: Maretta Bees on: 06/01/2018 09:38 AM   Modules accepted: Level of Service

## 2018-07-14 ENCOUNTER — Other Ambulatory Visit: Payer: Self-pay

## 2018-07-14 ENCOUNTER — Encounter: Payer: Self-pay | Admitting: Obstetrics

## 2018-07-14 ENCOUNTER — Ambulatory Visit (INDEPENDENT_AMBULATORY_CARE_PROVIDER_SITE_OTHER): Payer: Medicaid Other | Admitting: Obstetrics

## 2018-07-14 DIAGNOSIS — Z3042 Encounter for surveillance of injectable contraceptive: Secondary | ICD-10-CM | POA: Diagnosis not present

## 2018-07-14 NOTE — Progress Notes (Signed)
Subjective:    Emily Morse is a 33 y.o. female who presents for contraception counseling. The patient has no complaints today. The patient is sexually active. Pertinent past medical history: none.  The information documented in the HPI was reviewed and verified.  Menstrual History: OB History    Gravida  5   Para  3   Term  3   Preterm  0   AB  2   Living  3     SAB  2   TAB      Ectopic      Multiple  0   Live Births  3            No LMP recorded. Patient has had an injection.   Patient Active Problem List   Diagnosis Date Noted  . Poor dentition 04/12/2016  . Other fatigue 04/12/2016  . Multiple somatic complaints 04/12/2016  . Iron deficiency anemia due to chronic blood loss 04/11/2016  . Menorrhagia with regular cycle 04/11/2016  . Vitamin D deficiency 04/11/2016  . Thrombocytopenia (HCC) 02/18/2014  . H/O thrombocytopenia 08/04/2013  . GERD without esophagitis 08/04/2013   Past Medical History:  Diagnosis Date  . Anemia   . 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 Hx of gastroesophageal reflux (GERD)   . Thrombocytopenia (HCC)     Past Surgical History:  Procedure Laterality Date  . CESAREAN SECTION  10/22/2011   Procedure: CESAREAN SECTION;  Surgeon: Brock Bad, MD;  Location: [facility];  Service: Gynecology;  Laterality: N/A;  . CESAREAN SECTION N/A 12/07/2013   Procedure: CESAREAN SECTION;  Surgeon: Brock Bad, MD;  Location: [facility];  Service: Obstetrics;  Laterality: N/A;  . CESAREAN SECTION N/A 12/05/2014   Procedure: CESAREAN SECTION;  Surgeon: Brock Bad, MD;  Location: [facility];  Service: Obstetrics;  Laterality: N/A;  . DILATION AND CURETTAGE OF UTERUS       Current Outpatient Medications:  .  medroxyPROGESTERone (DEPO-PROVERA) 150 MG/ML injection, Inject 1 mL (150 mg total) into the muscle every 3 (three) months., Disp: 1 mL, Rfl: 3 .  clindamycin (CLEOCIN) 300 MG capsule, Take 1 capsule (300 mg total) by mouth 3 (three) times  daily. (Patient not taking: Reported on 06/01/2018), Disp: 21 capsule, Rfl: 0 .  cyclobenzaprine (FLEXERIL) 10 MG tablet, Take 1 tablet (10 mg total) by mouth every 8 (eight) hours as needed for muscle spasms. (Patient not taking: Reported on 04/03/2018), Disp: 30 tablet, Rfl: 1 .  metroNIDAZOLE (FLAGYL) 500 MG tablet, Take 1 tablet (500 mg total) by mouth 2 (two) times daily. (Patient not taking: Reported on 08/13/2017), Disp: 14 tablet, Rfl: 0 .  nitrofurantoin, macrocrystal-monohydrate, (MACROBID) 100 MG capsule, Take 1 capsule (100 mg total) by mouth 2 (two) times daily. (Patient not taking: Reported on 08/13/2017), Disp: 14 capsule, Rfl: 0 .  valACYclovir (VALTREX) 1000 MG tablet, Take 1 tablet 2 times daily for 3 days prn. (Patient not taking: Reported on 04/03/2018), Disp: 30 tablet, Rfl: prn  Current Facility-Administered Medications:  .  medroxyPROGESTERone (DEPO-PROVERA) injection 150 mg, 150 mg, Intramuscular, Q90 days, Brock Bad, MD, 150 mg at 12/03/17 1037 No Known Allergies  Social History   Tobacco Use  . Smoking status: Former Smoker    Types: Cigars    Last attempt to quit: 05/06/2013    Years since quitting: 5.1  . Smokeless tobacco: Never Used  . Tobacco comment: black and mild occasional  Substance Use Topics  .  Alcohol use: Yes    Alcohol/week: 0.0 standard drinks    Comment: Socially    Family History  Problem Relation Age of Onset  . Hypertension Mother   . Other Neg Hx   . Alcohol abuse Neg Hx   . Arthritis Neg Hx   . Asthma Neg Hx   . Birth defects Neg Hx   . Cancer Neg Hx   . COPD Neg Hx   . Depression Neg Hx   . Diabetes Neg Hx   . Drug abuse Neg Hx   . Early death Neg Hx   . Hearing loss Neg Hx   . Heart disease Neg Hx   . Hyperlipidemia Neg Hx   . Kidney disease Neg Hx   . Learning disabilities Neg Hx   . Mental illness Neg Hx   . Mental retardation Neg Hx   . Miscarriages / Stillbirths Neg Hx   . Stroke Neg Hx   . Vision loss Neg Hx   .  Varicose Veins Neg Hx        Review of Systems Constitutional: negative for weight loss Genitourinary:positive for AUB on Depo Positive   Objective:   There were no vitals taken for this visit.   PE:  Deferred   Lab Review Urine pregnancy test Labs reviewed yes Radiologic studies reviewed no  50% of 9 minute visit spent on counseling and coordination of care.    Assessment:    33 y.o., continuing Depo-Provera injections, no contraindications.   Plan:    All questions answered. Contraception: Depo-Provera injections. Diagnosis explained in detail, including differential. Follow up as needed. No orders of the defined types were placed in this encounter.  No orders of the defined types were placed in this encounter.  Brock BadHARLES A. Keylan Costabile MD 07-14-2018

## 2018-07-14 NOTE — Progress Notes (Signed)
S/w pt via tele visit, pt currently on depo, last injection 06-01-18. Pt reports that she has been spotting for the last 2 weeks, but is now having a full menstrual cycle. Last pap 04-03-18

## 2018-08-21 ENCOUNTER — Ambulatory Visit (INDEPENDENT_AMBULATORY_CARE_PROVIDER_SITE_OTHER): Payer: Medicaid Other

## 2018-08-21 ENCOUNTER — Other Ambulatory Visit: Payer: Self-pay

## 2018-08-21 DIAGNOSIS — Z3042 Encounter for surveillance of injectable contraceptive: Secondary | ICD-10-CM

## 2018-08-21 NOTE — Progress Notes (Signed)
Pt is in the office for depo injection, administered in LUOQ and pt tolerated well. Next due 7/31- 8/14 .Marland Kitchen Administrations This Visit    medroxyPROGESTERone (DEPO-PROVERA) injection 150 mg    Admin Date 08/21/2018 Action Given Dose 150 mg Route Intramuscular Administered By Katrina Stack, RN

## 2018-08-21 NOTE — Progress Notes (Signed)
I have reviewed the chart and agree with nursing staff's documentation of this patient's encounter.  Solace Manwarren, MD 08/21/2018 11:52 AM    

## 2018-08-26 IMAGING — CR DG KNEE COMPLETE 4+V*L*
4 series · 4 of 4 positions shown · non-contrast
Comparison: None.

CLINICAL DATA: Recent fall, knee pain

EXAM:
LEFT KNEE - COMPLETE 4+ VIEW

[t knee ap left]
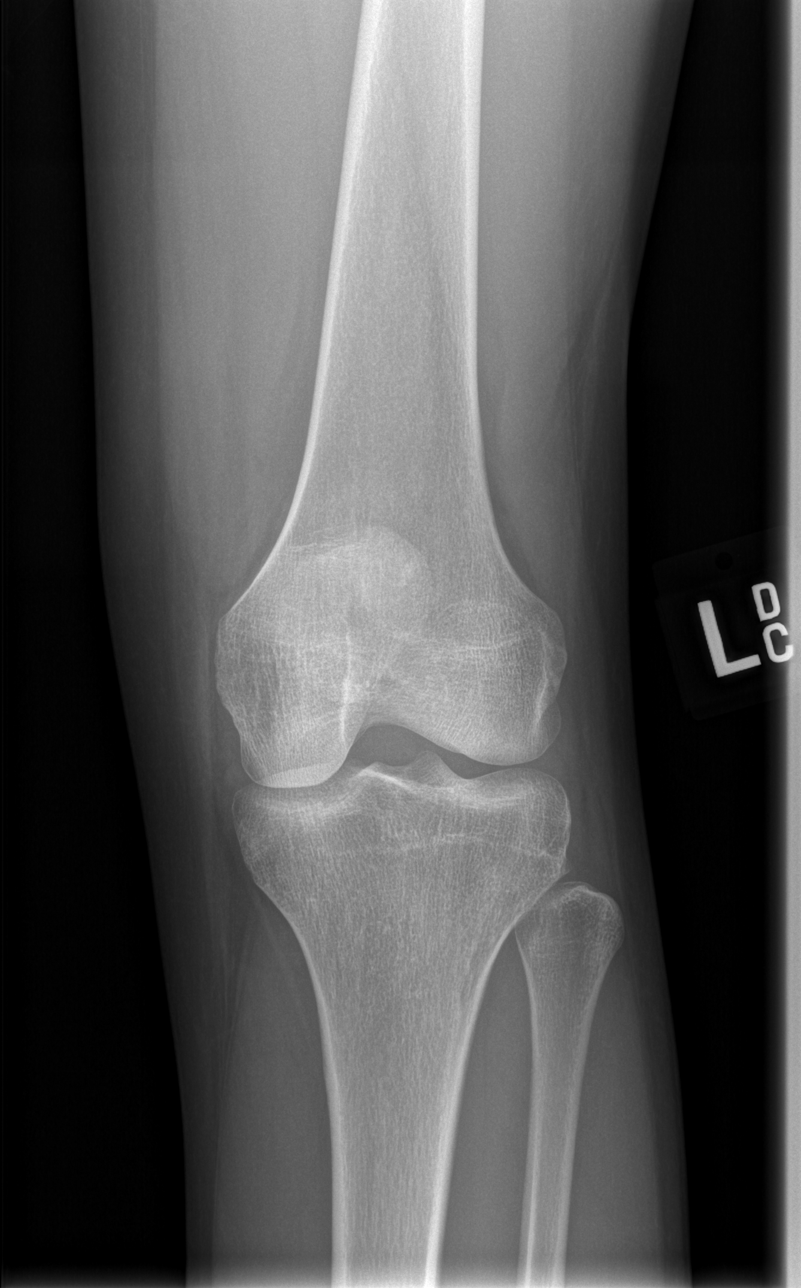

[t knee obl left (1 of 2)]
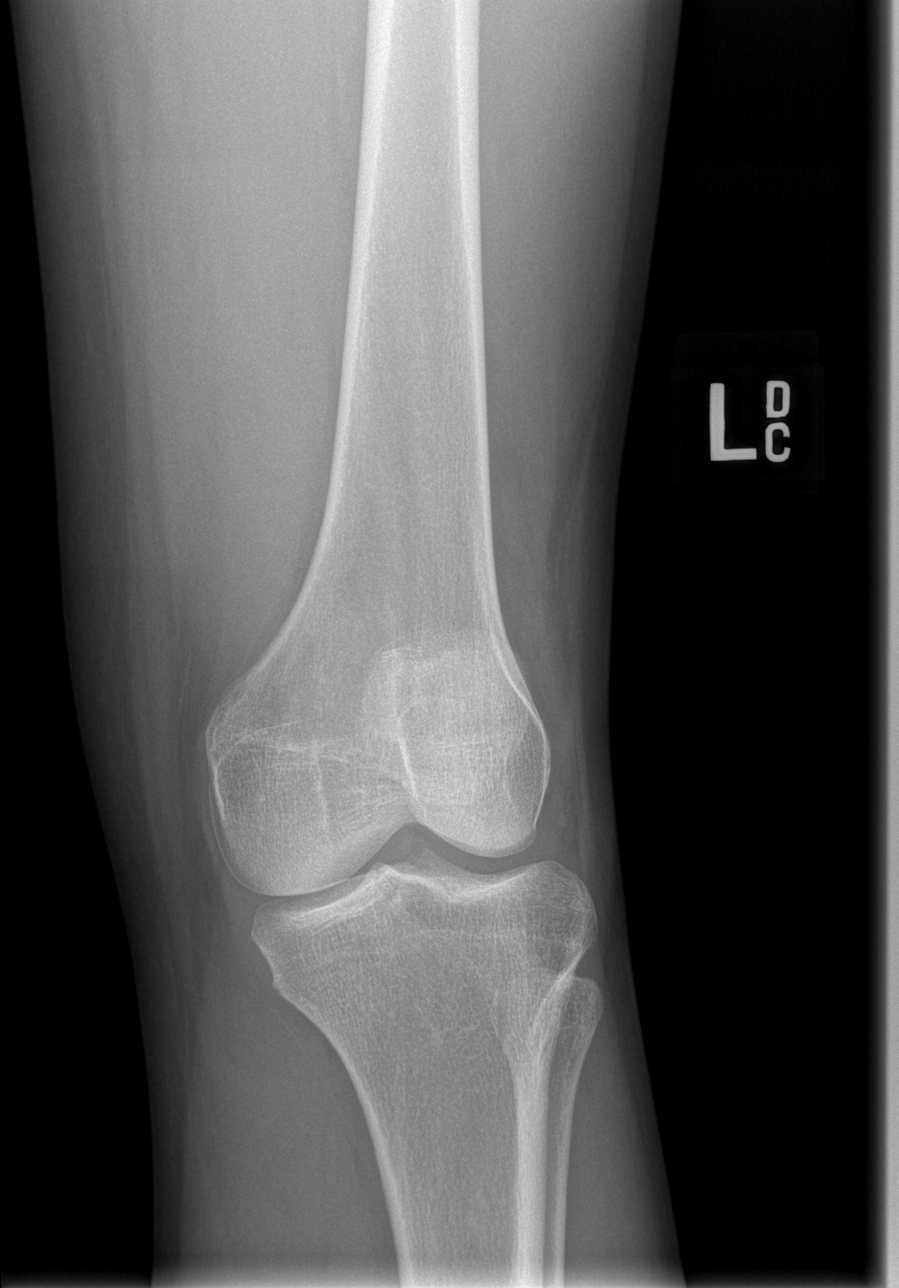

[t knee obl left (2 of 2)]
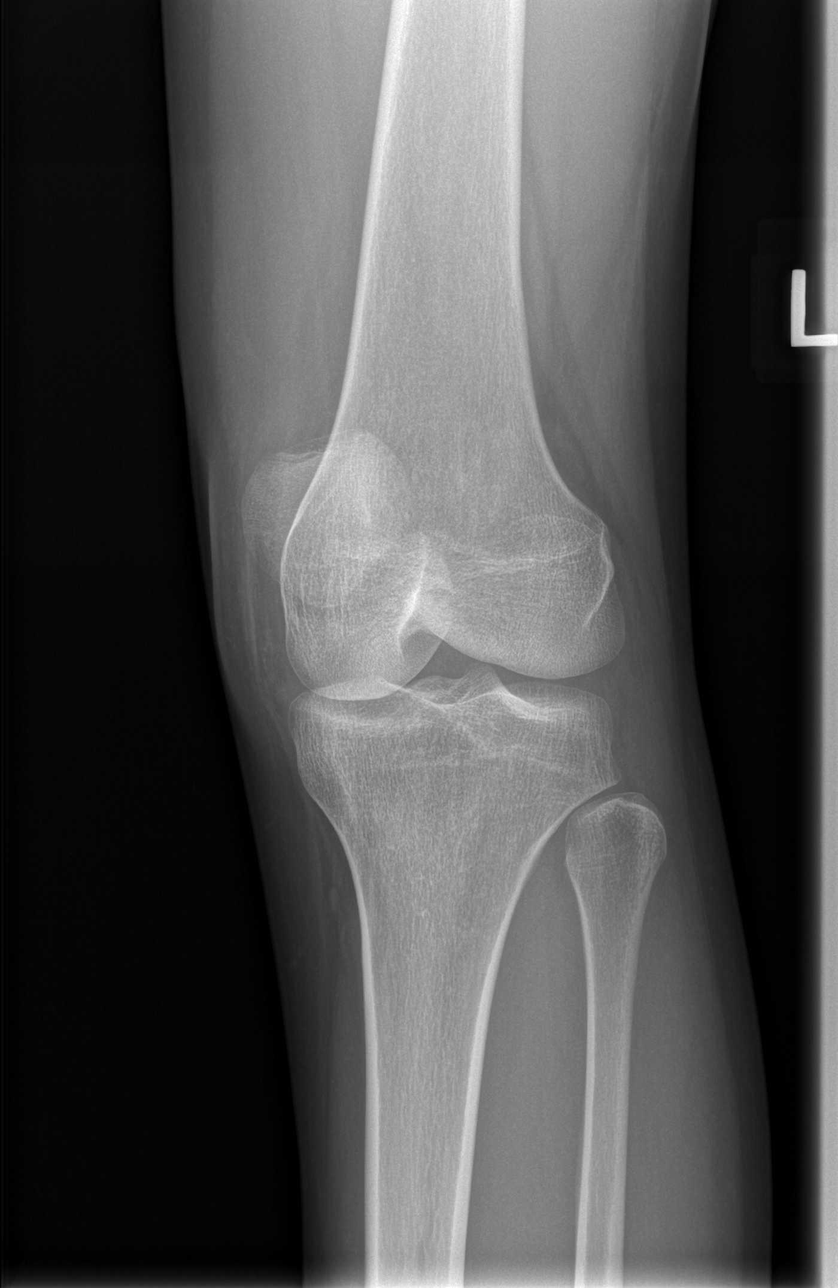

[t knee lat left]
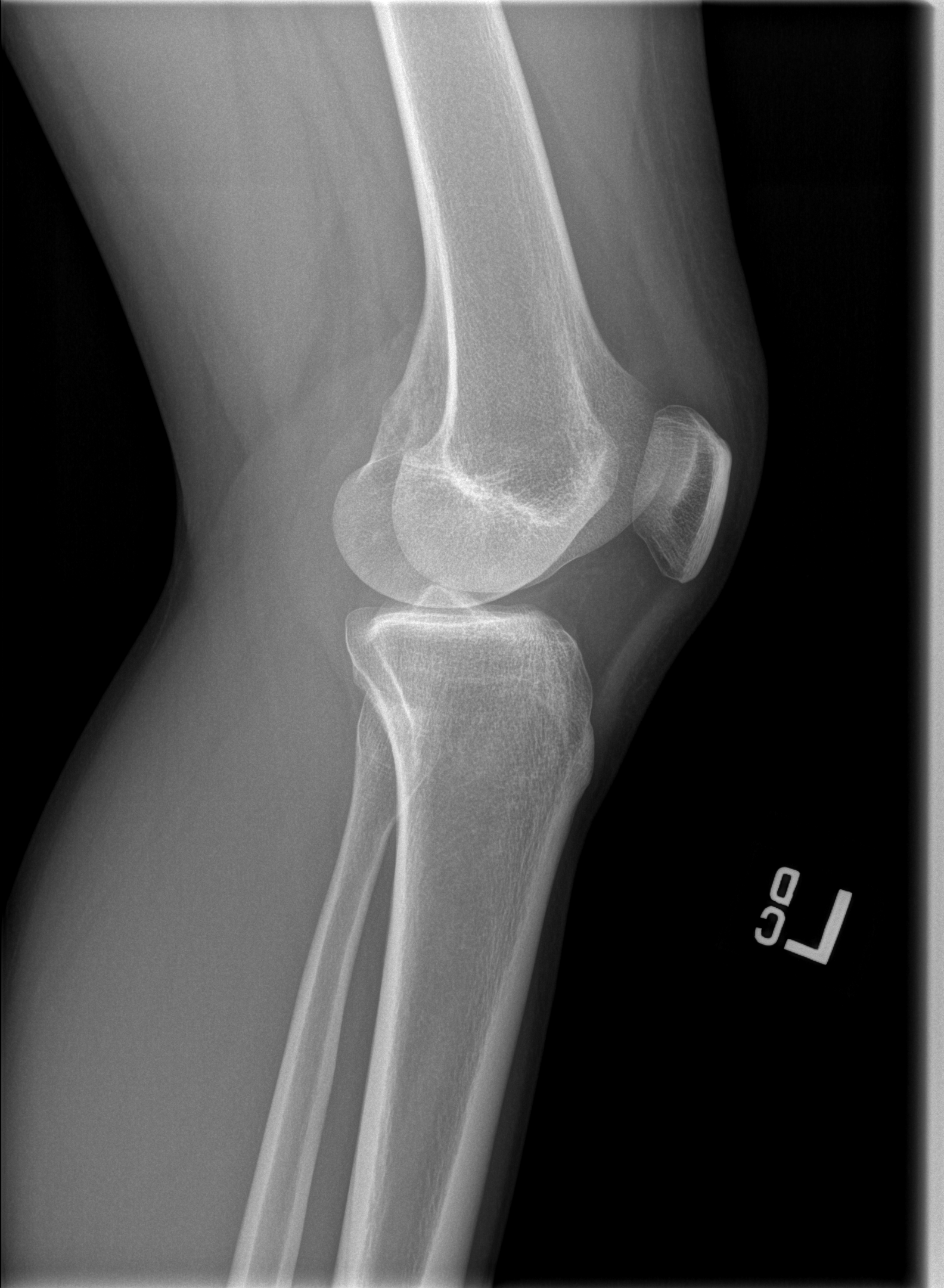

[4 of 4 positions shown; findings below may reference images not displayed]

FINDINGS: The left knee joint spaces appear normal. No fracture is seen. No
joint effusion is noted.
IMPRESSION: Negative.

## 2018-11-11 ENCOUNTER — Other Ambulatory Visit (HOSPITAL_COMMUNITY)
Admission: RE | Admit: 2018-11-11 | Discharge: 2018-11-11 | Disposition: A | Discharge status: Home / Self Care | Payer: Medicaid Other | Source: Ambulatory Visit | Attending: Obstetrics | Admitting: Obstetrics

## 2018-11-11 ENCOUNTER — Other Ambulatory Visit: Payer: Self-pay

## 2018-11-11 ENCOUNTER — Ambulatory Visit (INDEPENDENT_AMBULATORY_CARE_PROVIDER_SITE_OTHER): Payer: Medicaid Other

## 2018-11-11 DIAGNOSIS — N898 Other specified noninflammatory disorders of vagina: Secondary | ICD-10-CM

## 2018-11-11 DIAGNOSIS — Z3042 Encounter for surveillance of injectable contraceptive: Secondary | ICD-10-CM | POA: Diagnosis not present

## 2018-11-11 DIAGNOSIS — Z113 Encounter for screening for infections with a predominantly sexual mode of transmission: Secondary | ICD-10-CM

## 2018-11-11 NOTE — Progress Notes (Signed)
Nurse visit for pt supplied Depo. Pt is within her window. Depo given RUOQ w/o complaints Next Depo due Oct 21-Nov 4, pt agrees   During visit, pt requests all STD screening.

## 2018-11-12 LAB — HEPATITIS B SURFACE ANTIGEN: Hepatitis B Surface Ag: NEGATIVE

## 2018-11-12 LAB — HEPATITIS C ANTIBODY: Hep C Virus Ab: <0.1 s/co ratio (ref 0.0–0.9)

## 2018-11-12 LAB — HIV ANTIBODY (ROUTINE TESTING W REFLEX): HIV Screen 4th Generation wRfx: NONREACTIVE

## 2018-11-12 LAB — RPR: RPR Ser Ql: NONREACTIVE

## 2018-11-16 LAB — CERVICOVAGINAL ANCILLARY ONLY
Bacterial vaginitis: POSITIVE — AB
Candida vaginitis: NEGATIVE
Chlamydia: POSITIVE — AB
Neisseria Gonorrhea: NEGATIVE
Trichomonas: NEGATIVE

## 2018-11-16 NOTE — Progress Notes (Signed)
Patient ID: Emily Morse, female   DOB: 10-29-1985, 33 y.o.   MRN: [id number] Patient seen and assessed by nursing staff during this encounter. I have reviewed the chart and agree with the documentation and plan.  Emeterio Reeve, MD 11/16/2018 3:22 PM

## 2018-11-18 ENCOUNTER — Other Ambulatory Visit: Payer: Self-pay | Admitting: Obstetrics & Gynecology

## 2018-11-18 DIAGNOSIS — A749 Chlamydial infection, unspecified: Secondary | ICD-10-CM

## 2018-11-18 DIAGNOSIS — B9689 Other specified bacterial agents as the cause of diseases classified elsewhere: Secondary | ICD-10-CM

## 2018-11-18 MED ORDER — METRONIDAZOLE 500 MG PO TABS: 500.0000 mg | ORAL_TABLET | Freq: Two times a day (BID) | ORAL | 0 refills | Status: AC

## 2018-11-18 MED ORDER — AZITHROMYCIN 500 MG PO TABS: 1000.0000 mg | ORAL_TABLET | Freq: Once | ORAL | 1 refills | Status: AC

## 2018-11-18 NOTE — Progress Notes (Signed)
BV and chlamydia positive

## 2018-11-19 ENCOUNTER — Telehealth: Payer: Self-pay

## 2018-11-19 NOTE — Telephone Encounter (Signed)
TC from pt requesting rf for yeast. LVM for pt to c/b

## 2018-12-01 ENCOUNTER — Telehealth: Payer: Self-pay | Admitting: *Deleted

## 2018-12-01 ENCOUNTER — Other Ambulatory Visit: Payer: Self-pay | Admitting: Obstetrics

## 2018-12-01 DIAGNOSIS — N390 Urinary tract infection, site not specified: Secondary | ICD-10-CM

## 2018-12-01 MED ORDER — NITROFURANTOIN MONOHYD MACRO 100 MG PO CAPS: 100.0000 mg | ORAL_CAPSULE | Freq: Two times a day (BID) | ORAL | 0 refills | Status: DC

## 2018-12-01 NOTE — Telephone Encounter (Signed)
Pt called to office for Rx for UTI. Pt would like Rx sent to [facility] [address] recently treated for BV and [place] and recent call for yeast tx.   Please send Rx for UTI if approved.

## 2018-12-01 NOTE — Telephone Encounter (Signed)
Macrobid Rx for UTI 

## 2018-12-16 ENCOUNTER — Other Ambulatory Visit: Payer: Self-pay

## 2018-12-16 DIAGNOSIS — B9689 Other specified bacterial agents as the cause of diseases classified elsewhere: Secondary | ICD-10-CM

## 2018-12-16 DIAGNOSIS — N920 Excessive and frequent menstruation with regular cycle: Secondary | ICD-10-CM

## 2018-12-16 DIAGNOSIS — N760 Acute vaginitis: Secondary | ICD-10-CM

## 2018-12-16 DIAGNOSIS — N898 Other specified noninflammatory disorders of vagina: Secondary | ICD-10-CM

## 2018-12-16 DIAGNOSIS — Z3042 Encounter for surveillance of injectable contraceptive: Secondary | ICD-10-CM

## 2018-12-16 MED ORDER — METRONIDAZOLE 500 MG PO TABS: 500.0000 mg | ORAL_TABLET | Freq: Two times a day (BID) | ORAL | 0 refills | Status: DC

## 2018-12-16 NOTE — Progress Notes (Signed)
Pt reports she is still having discharge, an odor, and small amount of spotting. Pt reports she and partner were both treated back in August for Chlamydia, pt does not believe she has been re-exposed. I advised patient that I can send an antibiotic for BV symptoms, also advised that patient needs to come in for self swab to ensure that Chlamydia was cured. Pt advised she will be called to schedule self swab and notified that rx for BV symptoms sent to pharmacy, pt verbalizes understanding.

## 2018-12-18 ENCOUNTER — Other Ambulatory Visit: Payer: Self-pay

## 2018-12-18 ENCOUNTER — Ambulatory Visit: Payer: Medicaid Other

## 2018-12-18 ENCOUNTER — Other Ambulatory Visit (HOSPITAL_COMMUNITY)
Admission: RE | Admit: 2018-12-18 | Discharge: 2018-12-18 | Disposition: A | Discharge status: Home / Self Care | Payer: Medicaid Other | Source: Ambulatory Visit | Attending: Obstetrics | Admitting: Obstetrics

## 2018-12-18 DIAGNOSIS — N898 Other specified noninflammatory disorders of vagina: Secondary | ICD-10-CM

## 2018-12-18 NOTE — Progress Notes (Signed)
Pt presents for nurse visit self swab. She complains of having vaginal discharge with odor and vaginal itching. Sx has been present for about 3 weeks. Pt had a positive chlamydia and bv on 11/11/18. She states that she completed her treatment, but the discharge did not go away.

## 2018-12-25 ENCOUNTER — Other Ambulatory Visit: Payer: Self-pay | Admitting: Obstetrics

## 2018-12-25 ENCOUNTER — Telehealth: Payer: Self-pay

## 2018-12-25 DIAGNOSIS — A749 Chlamydial infection, unspecified: Secondary | ICD-10-CM

## 2018-12-25 MED ORDER — AZITHROMYCIN 500 MG PO TABS: 1000.0000 mg | ORAL_TABLET | Freq: Once | ORAL | 0 refills | Status: AC

## 2018-12-25 NOTE — Telephone Encounter (Signed)
Return call to pt regarding results Recent tested +CT  Pt c/o spotting, odor, discharge same sx's as before  Pt requested to be retreated due to waiting x 1 wk for results Pt confirmed no unprotected intercourse to cause reinfection.  Consulted w/ in office provider Rx sent to treat for CT Pt made aware and voiced understanding.

## 2018-12-25 NOTE — Progress Notes (Signed)
Patient still has spotting with vaginal discharge.  She was treated for chlamydia a week ago.  Will retreat.  Shelly Bombard, MD 12/25/2018 10:25 AM

## 2018-12-29 ENCOUNTER — Other Ambulatory Visit: Payer: Self-pay | Admitting: Obstetrics

## 2018-12-29 LAB — CERVICOVAGINAL ANCILLARY ONLY
Bacterial vaginitis: POSITIVE — AB
Candida vaginitis: NEGATIVE
Chlamydia: NEGATIVE
Neisseria Gonorrhea: NEGATIVE
Trichomonas: NEGATIVE

## 2019-02-03 ENCOUNTER — Ambulatory Visit: Payer: Medicaid Other

## 2019-02-08 ENCOUNTER — Ambulatory Visit (INDEPENDENT_AMBULATORY_CARE_PROVIDER_SITE_OTHER): Payer: Medicaid Other | Admitting: *Deleted

## 2019-02-08 ENCOUNTER — Other Ambulatory Visit: Payer: Self-pay

## 2019-02-08 DIAGNOSIS — Z3042 Encounter for surveillance of injectable contraceptive: Secondary | ICD-10-CM | POA: Diagnosis not present

## 2019-02-08 NOTE — Progress Notes (Signed)
Pt is in office for Depo injection.  Pt is on time for Depo. Injection given, pt tolerated well.   Pt advised to RTO 1/18-05/10/2019 for next Depo.  Pt has no other concerns today.    Administrations This Visit    medroxyPROGESTERone (DEPO-PROVERA) injection 150 mg    Admin Date 02/08/2019 Action Given Dose 150 mg Route Intramuscular Administered By Valene Bors, CMA

## 2019-02-21 ENCOUNTER — Other Ambulatory Visit: Payer: Self-pay

## 2019-02-21 ENCOUNTER — Encounter (HOSPITAL_COMMUNITY): Payer: Self-pay

## 2019-02-21 ENCOUNTER — Emergency Department (HOSPITAL_COMMUNITY)
Admission: EM | Admit: 2019-02-21 | Discharge: 2019-02-21 | Disposition: A | Discharge status: Home / Self Care | Payer: Medicaid Other | Attending: Emergency Medicine | Admitting: Emergency Medicine

## 2019-02-21 ENCOUNTER — Emergency Department (HOSPITAL_COMMUNITY): Payer: Medicaid Other

## 2019-02-21 DIAGNOSIS — Z87891 Personal history of nicotine dependence: Secondary | ICD-10-CM | POA: Diagnosis not present

## 2019-02-21 DIAGNOSIS — Y929 Unspecified place or not applicable: Secondary | ICD-10-CM | POA: Insufficient documentation

## 2019-02-21 DIAGNOSIS — Y939 Activity, unspecified: Secondary | ICD-10-CM | POA: Insufficient documentation

## 2019-02-21 DIAGNOSIS — S92334A Nondisplaced fracture of third metatarsal bone, right foot, initial encounter for closed fracture: Secondary | ICD-10-CM | POA: Diagnosis not present

## 2019-02-21 DIAGNOSIS — S92344A Nondisplaced fracture of fourth metatarsal bone, right foot, initial encounter for closed fracture: Secondary | ICD-10-CM | POA: Diagnosis not present

## 2019-02-21 DIAGNOSIS — S92301A Fracture of unspecified metatarsal bone(s), right foot, initial encounter for closed fracture: Secondary | ICD-10-CM

## 2019-02-21 DIAGNOSIS — S99921A Unspecified injury of right foot, initial encounter: Secondary | ICD-10-CM | POA: Diagnosis present

## 2019-02-21 DIAGNOSIS — W19XXXA Unspecified fall, initial encounter: Secondary | ICD-10-CM | POA: Insufficient documentation

## 2019-02-21 DIAGNOSIS — Z79899 Other long term (current) drug therapy: Secondary | ICD-10-CM | POA: Diagnosis not present

## 2019-02-21 DIAGNOSIS — Y999 Unspecified external cause status: Secondary | ICD-10-CM | POA: Diagnosis not present

## 2019-02-21 MED ORDER — HYDROCODONE-ACETAMINOPHEN 5-325 MG PO TABS: 1.0000 | ORAL_TABLET | ORAL | 0 refills | Status: DC | PRN

## 2019-02-21 MED ORDER — IBUPROFEN 800 MG PO TABS
800.0000 mg | ORAL_TABLET | Freq: Once | ORAL | Status: AC
  Administered 2019-02-21: 800 mg via ORAL
  Filled 2019-02-21: qty 1

## 2019-02-21 NOTE — ED Triage Notes (Signed)
Pt arrived stating she fell today and injured her right foot. Pt states she has been having bilateral foot pain for two weeks now, states she thinks it is arthritis.

## 2019-02-21 NOTE — Discharge Instructions (Signed)
Use crutches.  Weight-bear as tolerated. Take Motrin and Tylenol as needed as directed for pain. Take Norco for pain not controlled with Motrin and Tylenol.  Take this in place of your neck scheduled Tylenol dose. Elevate leg above the level of your heart, apply ice to your foot for 30 minutes at a time 3 times daily. Follow-up with orthopedics, call Monday to schedule appointment.

## 2019-02-21 NOTE — ED Provider Notes (Addendum)
[facility]-EMERGENCY DEPT Provider Note   CSN: [id number] Arrival date & time: 02/21/19  0047     History   Chief Complaint Chief Complaint  Patient presents with  . Foot Injury    HPI Emily Morse is a 33 y.o. female.     33 year old female presents with complaint of pain in her right foot.  Patient suspects that she has arthritis in her feet which cause her pain from time to time.  Patient states that she experienced a sharp pain in her foot which caused her to fall tonight now with pain in her right midfoot.  No other injuries as result of her fall.  No other complaints or concerns.     Past Medical History:  Diagnosis Date  . Anemia   . 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 Hx of gastroesophageal reflux (GERD)   . Thrombocytopenia [facility](HCC)     Patient Active Problem List   Diagnosis Date Noted  . Poor dentition 04/12/2016  . Other fatigue 04/12/2016  . Multiple somatic complaints 04/12/2016  . Iron deficiency anemia due to chronic blood loss 04/11/2016  . Menorrhagia with regular cycle 04/11/2016  . Vitamin D deficiency 04/11/2016  . Thrombocytopenia (HCC) 02/18/2014  . H/O thrombocytopenia 08/04/2013  . GERD without esophagitis 08/04/2013    Past Surgical History:  Procedure Laterality Date  . CESAREAN SECTION  10/22/2011   Procedure: CESAREAN SECTION;  Surgeon: Brock Badharles A [name], MD;  Location: [facility];  Service: Gynecology;  Laterality: N/A;  . CESAREAN SECTION N/A 12/07/2013   Procedure: CESAREAN SECTION;  Surgeon: Brock Badharles A [name], MD;  Location: [facility];  Service: Obstetrics;  Laterality: N/A;  . CESAREAN SECTION N/A 12/05/2014   Procedure: CESAREAN SECTION;  Surgeon: Brock Badharles A [name], MD;  Location: [facility];  Service: Obstetrics;  Laterality: N/A;  . DILATION AND CURETTAGE OF UTERUS       OB History    Gravida  5   Para  3   Term  3   Preterm  0   AB  2   Living  3     SAB  2   TAB      Ectopic      Multiple  0   Live  Births  3            Home Medications    Prior to Admission medications   Medication Sig Start Date End Date Taking? Authorizing Provider  clindamycin (CLEOCIN) 300 MG capsule Take 1 capsule (300 mg total) by mouth 3 (three) times daily. Patient not taking: Reported on 06/01/2018 04/03/18   Brock BadHarper, [name], MD  cyclobenzaprine (FLEXERIL) 10 MG tablet Take 1 tablet (10 mg total) by mouth every 8 (eight) hours as needed for muscle spasms. Patient not taking: Reported on 04/03/2018 11/20/16   Brock BadHarper, [name], MD  HYDROcodone-acetaminophen (NORCO/VICODIN) 5-325 MG tablet Take 1 tablet by mouth every 4 (four) hours as needed. 02/21/19   Jeannie FendMurphy, Hilda Wexler A, PA-C  medroxyPROGESTERone (DEPO-PROVERA) 150 MG/ML injection Inject 1 mL (150 mg total) into the muscle every 3 (three) months. 04/03/18   Brock BadHarper, [name], MD  metroNIDAZOLE (FLAGYL) 500 MG tablet Take 1 tablet (500 mg total) by mouth 2 (two) times daily. 12/16/18   Brock BadHarper, [name], MD  nitrofurantoin, macrocrystal-monohydrate, (MACROBID) 100 MG capsule Take 1 capsule (100 mg total) by mouth 2 (two) times daily. 12/01/18   Brock BadHarper, [name], MD  valACYclovir (VALTREX) 1000 MG tablet  Take 1 tablet 2 times daily for 3 days prn. Patient not taking: Reported on 04/03/2018 02/06/17   Emily Filbert, MD    Family History Family History  Problem Relation Age of Onset  . Hypertension Mother   . Other Neg Hx   . Alcohol abuse Neg Hx   . Arthritis Neg Hx   . Asthma Neg Hx   . Birth defects Neg Hx   . Cancer Neg Hx   . COPD Neg Hx   . Depression Neg Hx   . Diabetes Neg Hx   . Drug abuse Neg Hx   . Early death Neg Hx   . Hearing loss Neg Hx   . Heart disease Neg Hx   . Hyperlipidemia Neg Hx   . Kidney disease Neg Hx   . Learning disabilities Neg Hx   . Mental illness Neg Hx   . Mental retardation Neg Hx   . Miscarriages / Stillbirths Neg Hx   . Stroke Neg Hx   . Vision loss Neg Hx   . Varicose Veins Neg Hx     Social History Social  History   Tobacco Use  . Smoking status: Former Smoker    Types: Cigars    Quit date: 05/06/2013    Years since quitting: 5.8  . Smokeless tobacco: Never Used  . Tobacco comment: black and mild occasional  Substance Use Topics  . Alcohol use: Yes    Alcohol/week: 0.0 standard drinks    Comment: Socially  . Drug use: No     Allergies   Patient has no known allergies.   Review of Systems Review of Systems  Constitutional: Negative for fever.  Musculoskeletal: Positive for arthralgias, gait problem and joint swelling.  Skin: Negative for rash and wound.  Allergic/Immunologic: Negative for immunocompromised state.  Neurological: Negative for weakness and numbness.  Hematological: Does not bruise/bleed easily.  All other systems reviewed and are negative.    Physical Exam Updated Vital Signs BP (!) 129/94 (BP Location: Right Arm)   Pulse (!) 104   Temp 99.5 F (37.5 C) (Oral)   Resp 18   Ht 5\' 4"  (1.626 m)   SpO2 99%   BMI 25.04 kg/m   Physical Exam Vitals signs and nursing note reviewed.  Constitutional:      General: She is not in acute distress.    Appearance: She is well-developed. She is not diaphoretic.  HENT:     Head: Normocephalic and atraumatic.  Cardiovascular:     Pulses: Normal pulses.  Pulmonary:     Effort: Pulmonary effort is normal.  Musculoskeletal:        General: Swelling and tenderness present.     Right foot: Decreased range of motion. Normal capillary refill. Tenderness and bony tenderness present. No crepitus or laceration.       Feet:  Skin:    General: Skin is warm and dry.     Findings: No erythema or rash.  Neurological:     Mental Status: She is alert and oriented to person, place, and time.     Sensory: No sensory deficit.  Psychiatric:        Behavior: Behavior normal.      ED Treatments / Results  Labs (all labs ordered are listed, but only abnormal results are displayed) Labs Reviewed - No data to display  EKG  None  Radiology Dg Foot Complete Right  Result Date: 02/21/2019 CLINICAL DATA:  Right foot injury EXAM: RIGHT FOOT COMPLETE -  3+ VIEW COMPARISON:  None. FINDINGS: There are fractures near the base of the third and fourth metatarsals. Minimal displacement. No intra-articular extension visible. IMPRESSION: Minimally displaced fractures near the third and fourth metatarsal bases. Electronically Signed   By: Deatra Robinson M.D.   On: 02/21/2019 02:23    Procedures Procedures (including critical care time)  Medications Ordered in ED Medications  ibuprofen (ADVIL) tablet 800 mg (has no administration in time range)     Initial Impression / Assessment and Plan / ED Course  I have reviewed the triage vital signs and the nursing notes.  Pertinent labs & imaging results that were available during my care of the patient were reviewed by me and considered in my medical decision making (see chart for details).  Clinical Course as of Feb 20 233  Wynelle Link Feb 21, 2019  [id number] 33 year old female with right foot injury.  On exam has translocation right midfoot.  DP pulse present, sensation intact.  No other injuries.  Exam shows minimally displaced fractures of the right third and fourth metatarsals.  Patient to be placed in postop shoe, given crutches to weight-bear as tolerated.  Recommend ice, elevate, Motrin and Tylenol.  Take Norco in place of Tylenol as needed for pain.  Follow with orthopedics and referral given.   [LM]    Clinical Course User Index [LM] Jeannie Fend, PA-C      Final Clinical Impressions(s) / ED Diagnoses   Final diagnoses:  Closed nondisplaced fracture of metatarsal bone of right foot, unspecified metatarsal, initial encounter    ED Discharge Orders         Ordered    HYDROcodone-acetaminophen (NORCO/VICODIN) 5-325 MG tablet  Every 4 hours PRN     02/21/19 0233           Jeannie Fend, PA-C 02/21/19 0234    Jeannie Fend, PA-C 02/21/19 0235    Gilda Crease, MD 02/21/19 [id number]

## 2019-02-21 NOTE — ED Notes (Signed)
Patient transported to x-ray. ?

## 2019-02-25 ENCOUNTER — Other Ambulatory Visit: Payer: Self-pay | Admitting: Physician Assistant

## 2019-02-25 DIAGNOSIS — N644 Mastodynia: Secondary | ICD-10-CM

## 2019-02-26 ENCOUNTER — Other Ambulatory Visit: Payer: Self-pay | Admitting: Obstetrics

## 2019-02-26 DIAGNOSIS — N390 Urinary tract infection, site not specified: Secondary | ICD-10-CM

## 2019-03-03 ENCOUNTER — Other Ambulatory Visit: Payer: Self-pay | Admitting: Physician Assistant

## 2019-03-09 ENCOUNTER — Other Ambulatory Visit: Payer: Medicaid Other

## 2019-04-29 ENCOUNTER — Other Ambulatory Visit: Payer: Self-pay | Admitting: Obstetrics

## 2019-04-29 ENCOUNTER — Ambulatory Visit: Payer: Medicaid Other

## 2019-04-29 DIAGNOSIS — Z3042 Encounter for surveillance of injectable contraceptive: Secondary | ICD-10-CM

## 2019-05-05 ENCOUNTER — Ambulatory Visit (INDEPENDENT_AMBULATORY_CARE_PROVIDER_SITE_OTHER): Payer: Medicaid Other

## 2019-05-05 ENCOUNTER — Other Ambulatory Visit: Payer: Self-pay

## 2019-05-05 DIAGNOSIS — Z3042 Encounter for surveillance of injectable contraceptive: Secondary | ICD-10-CM

## 2019-05-05 NOTE — Progress Notes (Signed)
I have reviewed this chart and agree with the RN/CMA assessment and management.    K. Meryl Jovaughn Wojtaszek, M.D. Attending [facility] (Faculty Practice)   

## 2019-05-05 NOTE — Progress Notes (Signed)
Pt is in the office for depo injection, administered in and pt tolerated well. Next due Apr 14- April 28. .. Administrations This Visit    medroxyPROGESTERone (DEPO-PROVERA) injection 150 mg    Admin Date 05/05/2019 Action Given Dose 150 mg Route Intramuscular Administered By Katrina Stack, RN

## 2019-06-10 ENCOUNTER — Ambulatory Visit (INDEPENDENT_AMBULATORY_CARE_PROVIDER_SITE_OTHER): Payer: Medicaid Other | Admitting: Obstetrics

## 2019-06-10 ENCOUNTER — Encounter: Payer: Self-pay | Admitting: Obstetrics

## 2019-06-10 ENCOUNTER — Other Ambulatory Visit (HOSPITAL_COMMUNITY)
Admission: RE | Admit: 2019-06-10 | Discharge: 2019-06-10 | Disposition: A | Discharge status: Home / Self Care | Payer: Medicaid Other | Source: Ambulatory Visit | Attending: Obstetrics | Admitting: Obstetrics

## 2019-06-10 ENCOUNTER — Other Ambulatory Visit: Payer: Self-pay

## 2019-06-10 VITALS — BP 138/93 | HR 90 | Ht 64.0 in | Wt 135.6 lb

## 2019-06-10 DIAGNOSIS — Z0000 Encounter for general adult medical examination without abnormal findings: Secondary | ICD-10-CM | POA: Diagnosis not present

## 2019-06-10 DIAGNOSIS — Z113 Encounter for screening for infections with a predominantly sexual mode of transmission: Secondary | ICD-10-CM

## 2019-06-10 DIAGNOSIS — Z01419 Encounter for gynecological examination (general) (routine) without abnormal findings: Secondary | ICD-10-CM | POA: Diagnosis present

## 2019-06-10 DIAGNOSIS — N898 Other specified noninflammatory disorders of vagina: Secondary | ICD-10-CM

## 2019-06-10 DIAGNOSIS — D696 Thrombocytopenia, unspecified: Secondary | ICD-10-CM

## 2019-06-10 NOTE — Progress Notes (Signed)
Patient presents for AEX.   Last pap: 04/03/2018 HPV LMP: Depo

## 2019-06-10 NOTE — Progress Notes (Signed)
Subjective:        Emily Morse is a 34 y.o. female here for a routine exam.  Current complaints: Vaginal spotting for past 2 months.  Contraception:  Depo  Personal health questionnaire:  Is patient Ashkenazi Jewish, have a family history of breast and/or ovarian cancer: no Is there a family history of uterine cancer diagnosed at age < 37, gastrointestinal cancer, urinary tract cancer, family member who is a Field seismologist syndrome-associated carrier: no Is the patient overweight and hypertensive, family history of diabetes, personal history of gestational diabetes, preeclampsia or PCOS: no Is patient over 13, have PCOS,  family history of premature CHD under age 57, diabetes, smoke, have hypertension or peripheral artery disease:  no At any time, has a partner hit, kicked or otherwise hurt or frightened you?: no Over the past 2 weeks, have you felt down, depressed or hopeless?: no Over the past 2 weeks, have you felt little interest or pleasure in doing things?:no   Gynecologic History No LMP recorded. Patient has had an injection. Contraception: tubal ligation Last Pap: 04-03-2018. Results were: normal Last mammogram: n/a. Results were: n/a  Obstetric History OB History  Gravida Para Term Preterm AB Living  5 3 3  0 2 3  SAB TAB Ectopic Multiple Live Births  2     0 3    # Outcome Date GA Lbr Len/2nd Weight Sex Delivery Anes PTL Lv  5 Term 12/05/14 [redacted]w[redacted]d  7 lb 13.8 oz (3.566 kg) M CS-LTranv Gen  LIV  4 Term 12/07/13 [redacted]w[redacted]d  8 lb 0.6 oz (3.646 kg) M CS-LTranv Gen  LIV  3 Term 10/22/11 [redacted]w[redacted]d  7 lb 9.9 oz (3.455 kg) F CS-LTranv EPI  LIV  2 SAB 2010 101w0d            Birth Comments: neural tube defect iufd  1 SAB 2008 [redacted]w[redacted]d           Past Medical History:  Diagnosis Date  . Anemia   . 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 Hx of gastroesophageal reflux (GERD)   . Thrombocytopenia ([place])     Past Surgical History:  Procedure Laterality Date  . CESAREAN SECTION  10/22/2011    Procedure: CESAREAN SECTION;  Surgeon: Shelly Bombard, MD;  Location: [facility];  Service: Gynecology;  Laterality: N/A;  . CESAREAN SECTION N/A 12/07/2013   Procedure: CESAREAN SECTION;  Surgeon: Shelly Bombard, MD;  Location: [facility] [facility];  Service: Obstetrics;  Laterality: N/A;  . CESAREAN SECTION N/A 12/05/2014   Procedure: CESAREAN SECTION;  Surgeon: Shelly Bombard, MD;  Location: [facility];  Service: Obstetrics;  Laterality: N/A;  . DILATION AND CURETTAGE OF UTERUS       Current Outpatient Medications:  .  medroxyPROGESTERone (DEPO-PROVERA) 150 MG/ML injection, INJECT 1 ML (150 MG TOTAL) INTO THE MUSCLE EVERY 3 (THREE) MONTHS., Disp: 1 mL, Rfl: 3  Current Facility-Administered Medications:  .  medroxyPROGESTERone (DEPO-PROVERA) injection 150 mg, 150 mg, Intramuscular, Q90 days, Shelly Bombard, MD, 150 mg at 05/05/19 1330 No Known Allergies  Social History   Tobacco Use  . Smoking status: Former Smoker    Types: Cigars    Quit date: 05/06/2013    Years since quitting: 6.0  . Smokeless tobacco: Never Used  . Tobacco comment: black and mild occasional  Substance Use Topics  . Alcohol use: Yes    Alcohol/week: 0.0 standard drinks    Comment: Socially    Family History  Problem Relation Age of Onset  . Hypertension Mother   . Other Neg Hx   . Alcohol abuse Neg Hx   . Arthritis Neg Hx   . Asthma Neg Hx   . Birth defects Neg Hx   . Cancer Neg Hx   . COPD Neg Hx   . Depression Neg Hx   . Diabetes Neg Hx   . Drug abuse Neg Hx   . Early death Neg Hx   . Hearing loss Neg Hx   . Heart disease Neg Hx   . Hyperlipidemia Neg Hx   . Kidney disease Neg Hx   . Learning disabilities Neg Hx   . Mental illness Neg Hx   . Mental retardation Neg Hx   . Miscarriages / Stillbirths Neg Hx   . Stroke Neg Hx   . Vision loss Neg Hx   . Varicose Veins Neg Hx       Review of Systems  Constitutional: negative for fatigue and weight loss Respiratory: negative for cough and  wheezing Cardiovascular: negative for chest pain, fatigue and palpitations Gastrointestinal: negative for abdominal pain and change in bowel habits Musculoskeletal:negative for myalgias Neurological: negative for gait problems and tremors Behavioral/Psych: negative for abusive relationship, depression Endocrine: negative for temperature intolerance    Genitourinary:negative for abnormal menstrual periods, genital lesions, hot flashes, sexual problems and vaginal discharge Integument/breast: negative for breast lump, breast tenderness, nipple discharge and skin lesion(s)    Objective:       BP (!) 138/93   Pulse 90   Ht 5\' 4"  (1.626 m)   Wt 135 lb 9.6 oz (61.5 kg)   BMI 23.28 kg/m  General:   alert  Skin:   no rash or abnormalities  Lungs:   clear to auscultation bilaterally  Heart:   regular rate and rhythm, S1, S2 normal, no murmur, click, rub or gallop  Breasts:   normal without suspicious masses, skin or nipple changes or axillary nodes  Abdomen:  normal findings: no organomegaly, soft, non-tender and no hernia  Pelvis:  External genitalia: normal general appearance Urinary system: urethral meatus normal and bladder without fullness, nontender Vaginal: normal without tenderness, induration or masses Cervix: normal appearance Adnexa: normal bimanual exam Uterus: anteverted and non-tender, normal size   Lab Review Urine pregnancy test Labs reviewed yes Radiologic studies reviewed no  50% of 25 min visit spent on counseling and coordination of care.   Assessment:     1. Encounter for routine gynecological examination with Papanicolaou smear of cervix Rx: - Cytology - PAP( [place])  2. Vaginal discharge Rx: - Cervicovaginal ancillary only  3. Screen for STD (sexually transmitted disease) Rx: - Hepatitis B surface antigen - Hepatitis C antibody - HIV Antibody (routine testing w rflx) - RPR  4. Thrombocytopenia during pregnancy (HCC) Rx - CBC   Plan:     Education reviewed: calcium supplements, depression evaluation, low fat, low cholesterol diet, safe sex/STD prevention, self breast exams and weight bearing exercise. Contraception: tubal ligation. Follow up in: 1 year.   No orders of the defined types were placed in this encounter.  Orders Placed This Encounter  Procedures  . Hepatitis B surface antigen  . Hepatitis C antibody  . HIV Antibody (routine testing w rflx)  . RPR  . CBC    , MD 06/10/2019 2:40 PM

## 2019-06-11 LAB — CBC
Hematocrit: 49.3 % — ABNORMAL HIGH (ref 34.0–46.6)
Hemoglobin: 16.6 g/dL — ABNORMAL HIGH (ref 11.1–15.9)
MCH: 31.4 pg (ref 26.6–33.0)
MCHC: 33.7 g/dL (ref 31.5–35.7)
MCV: 93 fL (ref 79–97)
Platelets: 148 10*3/uL — ABNORMAL LOW (ref 150–450)
RBC: 5.29 x10E6/uL — ABNORMAL HIGH (ref 3.77–5.28)
RDW: 12.7 % (ref 11.7–15.4)
WBC: 5.7 10*3/uL (ref 3.4–10.8)

## 2019-06-11 LAB — HEPATITIS B SURFACE ANTIGEN: Hepatitis B Surface Ag: NEGATIVE

## 2019-06-11 LAB — CERVICOVAGINAL ANCILLARY ONLY
Bacterial Vaginitis (gardnerella): NEGATIVE
Candida Glabrata: NEGATIVE
Candida Vaginitis: NEGATIVE
Chlamydia: NEGATIVE
Comment: NEGATIVE
Comment: NEGATIVE
Comment: NEGATIVE
Comment: NEGATIVE
Comment: NEGATIVE
Comment: NORMAL
Neisseria Gonorrhea: NEGATIVE
Trichomonas: NEGATIVE

## 2019-06-11 LAB — HIV ANTIBODY (ROUTINE TESTING W REFLEX): HIV Screen 4th Generation wRfx: NONREACTIVE

## 2019-06-11 LAB — RPR: RPR Ser Ql: NONREACTIVE

## 2019-06-11 LAB — HEPATITIS C ANTIBODY: Hep C Virus Ab: <0.1 s/co ratio (ref 0.0–0.9)

## 2019-06-15 ENCOUNTER — Other Ambulatory Visit: Payer: Self-pay | Admitting: Obstetrics

## 2019-06-15 ENCOUNTER — Telehealth: Payer: Self-pay

## 2019-06-15 DIAGNOSIS — D696 Thrombocytopenia, unspecified: Secondary | ICD-10-CM

## 2019-06-15 LAB — CYTOLOGY - PAP
Comment: NEGATIVE
Diagnosis: NEGATIVE
High risk HPV: NEGATIVE

## 2019-06-15 NOTE — Telephone Encounter (Signed)
S/w pt and advised of results and referral.

## 2019-06-15 NOTE — Telephone Encounter (Signed)
Called pt to advise of results and hematology referral, no answer, left vm

## 2019-06-21 ENCOUNTER — Telehealth: Payer: Self-pay | Admitting: Hematology and Oncology

## 2019-06-21 NOTE — Telephone Encounter (Signed)
Scheduled per staff msg. Called and spoke with pt, confirmed 6/11 appt.

## 2019-07-07 ENCOUNTER — Ambulatory Visit: Payer: Medicaid Other | Admitting: Obstetrics

## 2019-07-28 ENCOUNTER — Ambulatory Visit: Payer: Medicaid Other

## 2019-08-09 ENCOUNTER — Other Ambulatory Visit: Payer: Self-pay

## 2019-08-09 ENCOUNTER — Ambulatory Visit (INDEPENDENT_AMBULATORY_CARE_PROVIDER_SITE_OTHER): Payer: Medicaid Other

## 2019-08-09 VITALS — BP 124/85 | HR 75 | Ht 64.0 in | Wt 139.0 lb

## 2019-08-09 DIAGNOSIS — Z3042 Encounter for surveillance of injectable contraceptive: Secondary | ICD-10-CM

## 2019-08-09 MED ORDER — MEDROXYPROGESTERONE ACETATE 150 MG/ML IM SUSP
150.0000 mg | Freq: Once | INTRAMUSCULAR | Status: AC
  Administered 2019-08-09: 150 mg via INTRAMUSCULAR

## 2019-08-09 NOTE — Progress Notes (Signed)
Presents for DEPO, given in [facility], tolerated well.  Next DEPO July 19 - August 05/2019  Administrations This Visit    medroxyPROGESTERone (DEPO-PROVERA) injection 150 mg    Admin Date 08/09/2019 Action Given Dose 150 mg Route Intramuscular Administered By Maretta Bees, RMA

## 2019-08-13 NOTE — Progress Notes (Signed)
Patient seen and assessed by nursing staff during this encounter. I have reviewed the chart and agree with the documentation and plan. I have also made any necessary editorial changes.  Catalina Antigua, MD 08/13/2019 8:16 AM

## 2019-09-16 ENCOUNTER — Other Ambulatory Visit: Payer: Self-pay | Admitting: Hematology and Oncology

## 2019-09-16 ENCOUNTER — Telehealth: Payer: Self-pay

## 2019-09-16 DIAGNOSIS — E559 Vitamin D deficiency, unspecified: Secondary | ICD-10-CM

## 2019-09-16 DIAGNOSIS — D500 Iron deficiency anemia secondary to blood loss (chronic): Secondary | ICD-10-CM

## 2019-09-16 NOTE — Telephone Encounter (Signed)
TC from pt requests ABx for a vaginal "hair bump." Informed pt we will need to evaluate her before sending in an ABx. She said Dr. Clearance Coots approved this request in the past. Message sent to Dr. Clearance Coots

## 2019-09-17 ENCOUNTER — Inpatient Hospital Stay: Payer: Medicaid Other | Attending: Hematology and Oncology

## 2019-09-17 ENCOUNTER — Other Ambulatory Visit: Payer: Self-pay

## 2019-09-17 ENCOUNTER — Other Ambulatory Visit: Payer: Self-pay | Admitting: Obstetrics

## 2019-09-17 ENCOUNTER — Telehealth: Payer: Self-pay

## 2019-09-17 ENCOUNTER — Encounter: Payer: Self-pay | Admitting: Hematology and Oncology

## 2019-09-17 ENCOUNTER — Inpatient Hospital Stay (HOSPITAL_BASED_OUTPATIENT_CLINIC_OR_DEPARTMENT_OTHER): Payer: Medicaid Other | Admitting: Hematology and Oncology

## 2019-09-17 DIAGNOSIS — D500 Iron deficiency anemia secondary to blood loss (chronic): Secondary | ICD-10-CM

## 2019-09-17 DIAGNOSIS — L738 Other specified follicular disorders: Secondary | ICD-10-CM

## 2019-09-17 DIAGNOSIS — D693 Immune thrombocytopenic purpura: Secondary | ICD-10-CM | POA: Insufficient documentation

## 2019-09-17 DIAGNOSIS — E559 Vitamin D deficiency, unspecified: Secondary | ICD-10-CM

## 2019-09-17 DIAGNOSIS — D696 Thrombocytopenia, unspecified: Secondary | ICD-10-CM

## 2019-09-17 LAB — CBC WITH DIFFERENTIAL/PLATELET
Abs Immature Granulocytes: 0.01 10*3/uL (ref 0.00–0.07)
Basophils Absolute: 0 10*3/uL (ref 0.0–0.1)
Basophils Relative: 0 %
Eosinophils Absolute: 0.1 10*3/uL (ref 0.0–0.5)
Eosinophils Relative: 2 %
HCT: 43.8 % (ref 36.0–46.0)
Hemoglobin: 14.5 g/dL (ref 12.0–15.0)
Immature Granulocytes: 0 %
Lymphocytes Relative: 29 %
Lymphs Abs: 1.4 10*3/uL (ref 0.7–4.0)
MCH: 31.5 pg (ref 26.0–34.0)
MCHC: 33.1 g/dL (ref 30.0–36.0)
MCV: 95.2 fL (ref 80.0–100.0)
Monocytes Absolute: 0.4 10*3/uL (ref 0.1–1.0)
Monocytes Relative: 8 %
Neutro Abs: 2.9 10*3/uL (ref 1.7–7.7)
Neutrophils Relative %: 61 %
Platelets: 128 10*3/uL — ABNORMAL LOW (ref 150–400)
RBC: 4.6 MIL/uL (ref 3.87–5.11)
RDW: 14 % (ref 11.5–15.5)
WBC: 4.8 10*3/uL (ref 4.0–10.5)
nRBC: 0 % (ref 0.0–0.2)

## 2019-09-17 LAB — IRON AND TIBC
Iron: 122 ug/dL (ref 41–142)
Saturation Ratios: 31 % (ref 21–57)
TIBC: 399 ug/dL (ref 236–444)
UIBC: 277 ug/dL (ref 120–384)

## 2019-09-17 LAB — FERRITIN: Ferritin: 104 ng/mL (ref 11–307)

## 2019-09-17 LAB — VITAMIN D 25 HYDROXY (VIT D DEFICIENCY, FRACTURES): Vit D, 25-Hydroxy: 16.74 ng/mL — ABNORMAL LOW (ref 30–100)

## 2019-09-17 MED ORDER — CLINDAMYCIN HCL 300 MG PO CAPS: 300.0000 mg | ORAL_CAPSULE | Freq: Three times a day (TID) | ORAL | 0 refills | Status: DC

## 2019-09-17 NOTE — Assessment & Plan Note (Signed)
She is no longer anemic She has minimal menstruation since she is placed on medroxyprogesterone No further work-up or treatment is needed

## 2019-09-17 NOTE — Telephone Encounter (Signed)
Clindamycin Rx for hair bump.

## 2019-09-17 NOTE — Assessment & Plan Note (Signed)
She has chronic ITP She does not need treatment and can be observed only

## 2019-09-17 NOTE — Telephone Encounter (Signed)
Called and given below message. She verbalized understanding. 

## 2019-09-17 NOTE — Progress Notes (Signed)
[facility] FOLLOW-UP progress notes  Patient Care Team: Patient, No Pcp Per as PCP - General (General Practice)  CHIEF COMPLAINTS/PURPOSE OF VISIT:  Chronic thrombocytopenia, for further management  HISTORY OF PRESENTING ILLNESS:  Emily Morse 34 y.o. female was seen because of chronic thrombocytopenia I saw her more than 3 years ago Even though she is an established patient, because more than 3 years have lapsed, she is billed as a new patient When I saw her before, the patient gestational ITP She was found to have abnormal CBC from prenatal visits. The patient have prior thrombocytopenia with 2 of her prior pregnancies. Her last platelet count on 09/26/2014 was low at 63,000. With her first pregnancy, her daughter was deliver on 10/22/2011 with epidural and C-section. She did receive prednisone prior to delivery and she responded to the treatment. With her second pregnancy, her son was born on 12/07/2013. She never received corticosteroids therapy and underwent a C-section under general anesthesia. On 10/12/2014, she was started on prednisone After delivery of her last child, she had tubal ligation.  She had iron deficiency anemia and had received iron replacement therapy She is being observed She has been put on medroxyprogesterone and have minimum menstruation with occasional spotting The patient denies any recent signs or symptoms of bleeding such as spontaneous epistaxis, hematuria or hematochezia. She does not take aspirin or NSAID  MEDICAL HISTORY:  Past Medical History:  Diagnosis Date  . Anemia   . 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 Hx of gastroesophageal reflux (GERD)   . Thrombocytopenia (HCC)     SURGICAL HISTORY: Past Surgical History:  Procedure Laterality Date  . CESAREAN SECTION  10/22/2011   Procedure: CESAREAN SECTION;  Surgeon: Brock Bad, MD;  Location: [facility];  Service: Gynecology;  Laterality: N/A;  . CESAREAN SECTION N/A 12/07/2013    Procedure: CESAREAN SECTION;  Surgeon: Brock Bad, MD;  Location: [facility];  Service: Obstetrics;  Laterality: N/A;  . CESAREAN SECTION N/A 12/05/2014   Procedure: CESAREAN SECTION;  Surgeon: Brock Bad, MD;  Location: [facility];  Service: Obstetrics;  Laterality: N/A;  . DILATION AND CURETTAGE OF UTERUS      SOCIAL HISTORY: Social History   Socioeconomic History  . Marital status: Single    Spouse name: Not on file  . Number of children: Not on file  . Years of education: Not on file  . Highest education level: Not on file  Occupational History  . Not on file  Tobacco Use  . Smoking status: Former Smoker    Types: Cigars    Quit date: 05/06/2013    Years since quitting: 6.3  . Smokeless tobacco: Never Used  . Tobacco comment: black and mild occasional  Vaping Use  . Vaping Use: Never used  Substance and Sexual Activity  . Alcohol use: Yes    Alcohol/week: 0.0 standard drinks    Comment: Socially  . Drug use: No  . Sexual activity: Yes    Partners: Male    Birth control/protection: Injection, Surgical  Other Topics Concern  . Not on file  Social History Narrative  . Not on file   Social Determinants of Health   Financial Resource Strain:   . Difficulty of Paying Living Expenses:   Food Insecurity:   . Worried About Programme researcher, broadcasting/film/video in the Last Year:   . Barista in the Last Year:   Transportation Needs:   .  Lack of Transportation (Medical):   Marland Kitchen Lack of Transportation (Non-Medical):   Physical Activity:   . Days of Exercise per Week:   . Minutes of Exercise per Session:   Stress:   . Feeling of Stress :   Social Connections:   . Frequency of Communication with Friends and Family:   . Frequency of Social Gatherings with Friends and Family:   . Attends Religious Services:   . Active Member of Clubs or Organizations:   . Attends Banker Meetings:   Marland Kitchen Marital Status:   Intimate Partner Violence:   . Fear of Current or Ex-Partner:    . Emotionally Abused:   Marland Kitchen Physically Abused:   . Sexually Abused:     FAMILY HISTORY: Family History  Problem Relation Age of Onset  . Hypertension Mother   . Other Neg Hx   . Alcohol abuse Neg Hx   . Arthritis Neg Hx   . Asthma Neg Hx   . Birth defects Neg Hx   . Cancer Neg Hx   . COPD Neg Hx   . Depression Neg Hx   . Diabetes Neg Hx   . Drug abuse Neg Hx   . Early death Neg Hx   . Hearing loss Neg Hx   . Heart disease Neg Hx   . Hyperlipidemia Neg Hx   . Kidney disease Neg Hx   . Learning disabilities Neg Hx   . Mental illness Neg Hx   . Mental retardation Neg Hx   . Miscarriages / Stillbirths Neg Hx   . Stroke Neg Hx   . Vision loss Neg Hx   . Varicose Veins Neg Hx     ALLERGIES:  has No Known Allergies.  MEDICATIONS:  Current Outpatient Medications  Medication Sig Dispense Refill  . clindamycin (CLEOCIN) 300 MG capsule Take 1 capsule (300 mg total) by mouth 3 (three) times daily. 21 capsule 0  . medroxyPROGESTERone (DEPO-PROVERA) 150 MG/ML injection INJECT 1 ML (150 MG TOTAL) INTO THE MUSCLE EVERY 3 (THREE) MONTHS. 1 mL 3   Current Facility-Administered Medications  Medication Dose Route Frequency Provider Last Rate Last Admin  . medroxyPROGESTERone (DEPO-PROVERA) injection 150 mg  150 mg Intramuscular Q90 days Brock Bad, MD   150 mg at 05/05/19 1330    REVIEW OF SYSTEMS:   Constitutional: Denies fevers, chills or abnormal night sweats Eyes: Denies blurriness of vision, double vision or watery eyes Ears, nose, mouth, throat, and face: Denies mucositis or sore throat Respiratory: Denies cough, dyspnea or wheezes Cardiovascular: Denies palpitation, chest discomfort or lower extremity swelling Gastrointestinal:  Denies nausea, heartburn or change in bowel habits Skin: Denies abnormal skin rashes Lymphatics: Denies new lymphadenopathy or easy bruising Neurological:Denies numbness, tingling or new weaknesses Behavioral/Psych: Mood is stable, no new  changes  All other systems were reviewed with the patient and are negative.  PHYSICAL EXAMINATION: ECOG PERFORMANCE STATUS: 0 - Asymptomatic  Vitals:   09/17/19 1111  BP: 131/90  Pulse: 72  Resp: 18  Temp: 98.4 F (36.9 C)  SpO2: 100%   Filed Weights   09/17/19 1111  Weight: 141 lb 6.4 oz (64.1 kg)    GENERAL:alert, no distress and comfortable NEURO: no focal motor/sensory deficits  LABORATORY DATA:  I have reviewed the data as listed Lab Results  Component Value Date   WBC 4.8 09/17/2019   HGB 14.5 09/17/2019   HCT 43.8 09/17/2019   MCV 95.2 09/17/2019   PLT 128 (L) 09/17/2019   No  results for input(s): NA, K, CL, CO2, GLUCOSE, BUN, CREATININE, CALCIUM, GFRNONAA, GFRAA, PROT, ALBUMIN, AST, ALT, ALKPHOS, BILITOT, BILIDIR, IBILI in the last 8760 hours.  ASSESSMENT & PLAN:  Iron deficiency anemia due to chronic blood loss She is no longer anemic She has minimal menstruation since she is placed on medroxyprogesterone No further work-up or treatment is needed  Thrombocytopenia ([place]) She has chronic ITP She does not need treatment and can be observed only   No orders of the defined types were placed in this encounter.   All questions were answered. The patient knows to call the clinic with any problems, questions or concerns. The total time spent in the appointment was 40 minutes encounter with patients including review of chart and various tests results, discussions about plan of care and coordination of care plan   Heath Lark, MD 09/17/2019 1:35 PM

## 2019-09-17 NOTE — Telephone Encounter (Signed)
Pt made aware ABx has been approved.

## 2019-09-17 NOTE — Telephone Encounter (Signed)
-----   Message from Artis Delay, MD sent at 09/17/2019  2:44 PM EDT ----- Regarding: she has low vitamin D, recommend OTC vitamin D 2000 units daily

## 2019-09-20 ENCOUNTER — Telehealth: Payer: Self-pay | Admitting: Hematology and Oncology

## 2019-09-20 NOTE — Telephone Encounter (Signed)
Scheduled per 6/11 sch message. Mailing appt calendar to pt.  

## 2019-11-01 ENCOUNTER — Ambulatory Visit: Payer: Medicaid Other

## 2019-11-09 ENCOUNTER — Other Ambulatory Visit: Payer: Self-pay

## 2019-11-09 ENCOUNTER — Ambulatory Visit (INDEPENDENT_AMBULATORY_CARE_PROVIDER_SITE_OTHER): Payer: Medicaid Other

## 2019-11-09 VITALS — BP 125/86 | HR 69 | Wt 144.0 lb

## 2019-11-09 DIAGNOSIS — Z3042 Encounter for surveillance of injectable contraceptive: Secondary | ICD-10-CM

## 2019-11-09 MED ORDER — MEDROXYPROGESTERONE ACETATE 150 MG/ML IM SUSP
150.0000 mg | Freq: Once | INTRAMUSCULAR | Status: AC
  Administered 2019-11-09: 150 mg via INTRAMUSCULAR

## 2019-11-09 NOTE — Progress Notes (Signed)
Agree with A & P. 

## 2019-11-09 NOTE — Progress Notes (Signed)
GYN presents for DEPO Injection, given in RD, tolerated well.  Next DEPO Oct. 19 - Nov. 2, 2021  Administrations This Visit    medroxyPROGESTERone (DEPO-PROVERA) injection 150 mg    Admin Date 11/09/2019 Action Given Dose 150 mg Route Intramuscular Administered By Maretta Bees, RMA         Patient wants to stop the DEPO Injections as she is spotting every day. Advised to make an appointment to see Provider.

## 2019-11-15 ENCOUNTER — Other Ambulatory Visit: Payer: Self-pay

## 2019-11-15 ENCOUNTER — Encounter: Payer: Self-pay | Admitting: Obstetrics

## 2019-11-15 ENCOUNTER — Ambulatory Visit (INDEPENDENT_AMBULATORY_CARE_PROVIDER_SITE_OTHER): Payer: Medicaid Other | Admitting: Obstetrics

## 2019-11-15 ENCOUNTER — Other Ambulatory Visit (HOSPITAL_COMMUNITY)
Admission: RE | Admit: 2019-11-15 | Discharge: 2019-11-15 | Disposition: A | Discharge status: Home / Self Care | Payer: Medicaid Other | Source: Ambulatory Visit | Attending: Obstetrics | Admitting: Obstetrics

## 2019-11-15 VITALS — BP 149/100 | HR 69 | Wt 140.5 lb

## 2019-11-15 DIAGNOSIS — N939 Abnormal uterine and vaginal bleeding, unspecified: Secondary | ICD-10-CM

## 2019-11-15 DIAGNOSIS — Z113 Encounter for screening for infections with a predominantly sexual mode of transmission: Secondary | ICD-10-CM

## 2019-11-15 DIAGNOSIS — N898 Other specified noninflammatory disorders of vagina: Secondary | ICD-10-CM

## 2019-11-15 NOTE — Progress Notes (Signed)
Pt c/o spotting on Depo. Has BTL  Pt requests all STD testing today

## 2019-11-15 NOTE — Progress Notes (Signed)
Subjective:    Emily Morse is a 34 y.o. female who presents for contraception management. The patient has complaints of irregular vaginal bleeding with Depo Provera injections, and she wants to discontinue Depo.  She has had tubal sterilization done.  The patient is sexually active. Pertinent past medical history: hypertension.  The information documented in the HPI was reviewed and verified.  Menstrual History: OB History    Gravida  5   Para  3   Term  3   Preterm  0   AB  2   Living  3     SAB  2   TAB      Ectopic      Multiple  0   Live Births  3            No LMP recorded. Patient has had an injection.   Patient Active Problem List   Diagnosis Date Noted  . Poor dentition 04/12/2016  . Other fatigue 04/12/2016  . Multiple somatic complaints 04/12/2016  . Iron deficiency anemia due to chronic blood loss 04/11/2016  . Menorrhagia with regular cycle 04/11/2016  . Vitamin D deficiency 04/11/2016  . Thrombocytopenia (HCC) 02/18/2014  . H/O thrombocytopenia 08/04/2013  . GERD without esophagitis 08/04/2013   Past Medical History:  Diagnosis Date  . Anemia   . 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 Hx of gastroesophageal reflux (GERD)   . Thrombocytopenia (HCC)     Past Surgical History:  Procedure Laterality Date  . CESAREAN SECTION  10/22/2011   Procedure: CESAREAN SECTION;  Surgeon: Brock Bad, MD;  Location: [facility];  Service: Gynecology;  Laterality: N/A;  . CESAREAN SECTION N/A 12/07/2013   Procedure: CESAREAN SECTION;  Surgeon: Brock Bad, MD;  Location: [facility];  Service: Obstetrics;  Laterality: N/A;  . CESAREAN SECTION N/A 12/05/2014   Procedure: CESAREAN SECTION;  Surgeon: Brock Bad, MD;  Location: [facility];  Service: Obstetrics;  Laterality: N/A;  . DILATION AND CURETTAGE OF UTERUS       Current Outpatient Medications:  .  medroxyPROGESTERone (DEPO-PROVERA) 150 MG/ML injection, INJECT 1 ML (150 MG TOTAL) INTO THE MUSCLE EVERY 3  (THREE) MONTHS., Disp: 1 mL, Rfl: 3 .  clindamycin (CLEOCIN) 300 MG capsule, Take 1 capsule (300 mg total) by mouth 3 (three) times daily. (Patient not taking: Reported on 11/15/2019), Disp: 21 capsule, Rfl: 0  Current Facility-Administered Medications:  .  medroxyPROGESTERone (DEPO-PROVERA) injection 150 mg, 150 mg, Intramuscular, Q90 days, Brock Bad, MD, 150 mg at 05/05/19 1330 No Known Allergies  Social History   Tobacco Use  . Smoking status: Former Smoker    Types: Cigars    Quit date: 05/06/2013    Years since quitting: 6.5  . Smokeless tobacco: Never Used  . Tobacco comment: black and mild occasional  Substance Use Topics  . Alcohol use: Yes    Alcohol/week: 0.0 standard drinks    Comment: Socially    Family History  Problem Relation Age of Onset  . Hypertension Mother   . Other Neg Hx   . Alcohol abuse Neg Hx   . Arthritis Neg Hx   . Asthma Neg Hx   . Birth defects Neg Hx   . Cancer Neg Hx   . COPD Neg Hx   . Depression Neg Hx   . Diabetes Neg Hx   . Drug abuse Neg Hx   . Early death Neg Hx   . Hearing loss  Neg Hx   . Heart disease Neg Hx   . Hyperlipidemia Neg Hx   . Kidney disease Neg Hx   . Learning disabilities Neg Hx   . Mental illness Neg Hx   . Mental retardation Neg Hx   . Miscarriages / Stillbirths Neg Hx   . Stroke Neg Hx   . Vision loss Neg Hx   . Varicose Veins Neg Hx        Review of Systems Constitutional: negative for weight loss Genitourinary:negative for abnormal menstrual periods and vaginal discharge   Objective:   BP (!) 149/100   Pulse 69   Wt 140 lb 8 oz (63.7 kg)   BMI 24.12 kg/m            General: Alert and no distress Abdomen:  normal findings: no organomegaly, soft, non-tender and no hernia  Pelvis:  External genitalia: normal general appearance Urinary system: urethral meatus normal and bladder without fullness, nontender Vaginal: normal without tenderness, induration or masses Cervix: normal  appearance Adnexa: normal bimanual exam Uterus: anteverted and non-tender, normal size   Lab Review Urine pregnancy test Labs reviewed yes Radiologic studies reviewed no  50% of 15 min visit spent on counseling and coordination of care.    Assessment:    34 y.o., discontinuing Depo-Provera injections, no contraindications.   Plan:    All questions answered. Discussed healthy lifestyle modifications. Follow up in 8 months. discontinue Depo Provera   No orders of the defined types were placed in this encounter.  Orders Placed This Encounter  Procedures  . Hepatitis B surface antigen  . Hepatitis C antibody  . HIV Antibody (routine testing w rflx)  . RPR    Brock Bad, MD 11/15/2019 2:29 PM

## 2019-11-16 LAB — HIV ANTIBODY (ROUTINE TESTING W REFLEX): HIV Screen 4th Generation wRfx: NONREACTIVE

## 2019-11-16 LAB — RPR: RPR Ser Ql: NONREACTIVE

## 2019-11-16 LAB — CERVICOVAGINAL ANCILLARY ONLY
Chlamydia: NEGATIVE
Comment: NEGATIVE
Comment: NEGATIVE
Comment: NORMAL
Neisseria Gonorrhea: NEGATIVE
Trichomonas: NEGATIVE

## 2019-11-16 LAB — HEPATITIS B SURFACE ANTIGEN: Hepatitis B Surface Ag: NEGATIVE

## 2019-11-16 LAB — HEPATITIS C ANTIBODY: Hep C Virus Ab: 0.1 s/co ratio (ref 0.0–0.9)

## 2020-01-03 ENCOUNTER — Ambulatory Visit: Payer: Medicaid Other | Admitting: Obstetrics

## 2020-01-04 ENCOUNTER — Ambulatory Visit (INDEPENDENT_AMBULATORY_CARE_PROVIDER_SITE_OTHER): Payer: Medicaid Other | Admitting: Obstetrics

## 2020-01-04 ENCOUNTER — Other Ambulatory Visit: Payer: Self-pay

## 2020-01-04 ENCOUNTER — Encounter: Payer: Self-pay | Admitting: Obstetrics

## 2020-01-04 ENCOUNTER — Other Ambulatory Visit: Payer: Self-pay | Admitting: Obstetrics

## 2020-01-04 VITALS — BP 130/88 | HR 69 | Wt 139.0 lb

## 2020-01-04 DIAGNOSIS — N644 Mastodynia: Secondary | ICD-10-CM | POA: Diagnosis not present

## 2020-01-04 DIAGNOSIS — N939 Abnormal uterine and vaginal bleeding, unspecified: Secondary | ICD-10-CM

## 2020-01-04 DIAGNOSIS — Z3009 Encounter for other general counseling and advice on contraception: Secondary | ICD-10-CM

## 2020-01-04 DIAGNOSIS — D696 Thrombocytopenia, unspecified: Secondary | ICD-10-CM | POA: Diagnosis not present

## 2020-01-04 NOTE — Progress Notes (Signed)
Pt states that she has been having prolong bleeding x 2 months.  No bleeding now, stopped about 3 days ago.

## 2020-01-04 NOTE — Progress Notes (Signed)
Patient ID: Emily Morse, female   DOB: 01/07/1986, 34 y.o.   MRN: [id number]  Chief Complaint  Patient presents with  . Gynecologic Exam    Emily Morse is a 34 y.o. female.  Complains of irregular vaginal bleeding.  Also has breast tenderness, bilateral Emily  Past Medical History:  Diagnosis Date  . Anemia   . 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 Hx of gastroesophageal reflux (GERD)   . Thrombocytopenia (HCC)     Past Surgical History:  Procedure Laterality Date  . CESAREAN SECTION  10/22/2011   Procedure: CESAREAN SECTION;  Surgeon: Brock Bad, MD;  Location: [facility];  Service: Gynecology;  Laterality: N/A;  . CESAREAN SECTION N/A 12/07/2013   Procedure: CESAREAN SECTION;  Surgeon: Brock Bad, MD;  Location: [facility];  Service: Obstetrics;  Laterality: N/A;  . CESAREAN SECTION N/A 12/05/2014   Procedure: CESAREAN SECTION;  Surgeon: Brock Bad, MD;  Location: [facility];  Service: Obstetrics;  Laterality: N/A;  . DILATION AND CURETTAGE OF UTERUS      Family History  Problem Relation Age of Onset  . Hypertension Mother   . Other Neg Hx   . Alcohol abuse Neg Hx   . Arthritis Neg Hx   . Asthma Neg Hx   . Birth defects Neg Hx   . Cancer Neg Hx   . COPD Neg Hx   . Depression Neg Hx   . Diabetes Neg Hx   . Drug abuse Neg Hx   . Early death Neg Hx   . Hearing loss Neg Hx   . Heart disease Neg Hx   . Hyperlipidemia Neg Hx   . Kidney disease Neg Hx   . Learning disabilities Neg Hx   . Mental illness Neg Hx   . Mental retardation Neg Hx   . Miscarriages / Stillbirths Neg Hx   . Stroke Neg Hx   . Vision loss Neg Hx   . Varicose Veins Neg Hx     Social History Social History   Tobacco Use  . Smoking status: Former Smoker    Types: Cigars    Quit date: 05/06/2013    Years since quitting: 6.6  . Smokeless tobacco: Never Used  . Tobacco comment: black and mild occasional  Vaping Use  . Vaping Use: Never used  Substance Use Topics  . Alcohol use: Yes     Alcohol/week: 0.0 standard drinks    Comment: Socially  . Drug use: No    No Known Allergies  Current Outpatient Medications  Medication Sig Dispense Refill  . medroxyPROGESTERone (DEPO-PROVERA) 150 MG/ML injection INJECT 1 ML (150 MG TOTAL) INTO THE MUSCLE EVERY 3 (THREE) MONTHS. 1 mL 3   Current Facility-Administered Medications  Medication Dose Route Frequency Provider Last Rate Last Admin  . medroxyPROGESTERone (DEPO-PROVERA) injection 150 mg  150 mg Intramuscular Q90 days Brock Bad, MD   150 mg at 05/05/19 1330    Review of Systems Review of Systems Constitutional: negative for fatigue and weight loss Respiratory: negative for cough and wheezing Cardiovascular: negative for chest pain, fatigue and palpitations Gastrointestinal: negative for abdominal pain and change in bowel habits Genitourinary: positive for irregular vaginal bleeding Integument/breast: positive for breast tenderness, bilateral Musculoskeletal:negative for myalgias Neurological: negative for gait problems and tremors Behavioral/Psych: negative for abusive relationship, depression Endocrine: negative for temperature intolerance      Blood pressure 130/88, pulse 69, weight 139 lb (63 kg).  Physical  Exam Physical Exam:  Deferred   >50% of 15 min visit spent on counseling and coordination of care.   Data Reviewed Labs  Assessment     1. Abnormal uterine bleeding (AUB) - recently stopped.  Will follow clinically.  2. Mastodynia Rx: - MM DIAG BREAST TOMO BILATERAL; Future  3. Thrombocytopenia (HCC) Rx: - CBC  4. Encounter for other general counseling and advice on contraception - wants to restart Depo Injections    Plan  Follow up prn    Orders Placed This Encounter  Procedures  . MM DIAG BREAST TOMO BILATERAL    Standing Status:   Future    Standing Expiration Date:   01/03/2021    Order Specific Question:   Reason for Exam (SYMPTOM  OR DIAGNOSIS REQUIRED)    Answer:    Breast tendernerness    Order Specific Question:   Is the patient pregnant?    Answer:   No    Order Specific Question:   Preferred imaging location?    Answer:   [facility]  . CBC      Brock Bad, MD 01/04/2020 11:50 AM

## 2020-01-05 LAB — CBC
Hematocrit: 42.3 % (ref 34.0–46.6)
Hemoglobin: 14.2 g/dL (ref 11.1–15.9)
MCH: 32.1 pg (ref 26.6–33.0)
MCHC: 33.6 g/dL (ref 31.5–35.7)
MCV: 96 fL (ref 79–97)
Platelets: 145 10*3/uL — ABNORMAL LOW (ref 150–450)
RBC: 4.43 x10E6/uL (ref 3.77–5.28)
RDW: 12.2 % (ref 11.7–15.4)
WBC: 5.7 10*3/uL (ref 3.4–10.8)

## 2020-01-21 ENCOUNTER — Ambulatory Visit
Admission: RE | Admit: 2020-01-21 | Discharge: 2020-01-21 | Disposition: A | Discharge status: Home / Self Care | Payer: Medicaid Other | Source: Ambulatory Visit | Attending: Obstetrics | Admitting: Obstetrics

## 2020-01-21 ENCOUNTER — Ambulatory Visit: Payer: Medicaid Other

## 2020-01-21 ENCOUNTER — Other Ambulatory Visit: Payer: Self-pay

## 2020-01-21 ENCOUNTER — Other Ambulatory Visit: Payer: Self-pay | Admitting: Obstetrics

## 2020-01-21 DIAGNOSIS — N644 Mastodynia: Secondary | ICD-10-CM

## 2020-01-31 ENCOUNTER — Ambulatory Visit (INDEPENDENT_AMBULATORY_CARE_PROVIDER_SITE_OTHER): Payer: Medicaid Other

## 2020-01-31 ENCOUNTER — Other Ambulatory Visit: Payer: Self-pay

## 2020-01-31 DIAGNOSIS — Z3042 Encounter for surveillance of injectable contraceptive: Secondary | ICD-10-CM

## 2020-01-31 NOTE — Progress Notes (Signed)
Pt is in the office for depo injection, administered in LUOQ and pt tolerated well .Marland Kitchen Administrations This Visit    medroxyPROGESTERone (DEPO-PROVERA) injection 150 mg    Admin Date 01/31/2020 Action Given Dose 150 mg Route Intramuscular Administered By Katrina Stack, RN

## 2020-02-02 NOTE — Progress Notes (Signed)
I have reviewed this chart and agree with the RN/CMA assessment and management.    K. Meryl Diera Wirkkala, M.D. Attending [facility])   

## 2020-04-17 ENCOUNTER — Other Ambulatory Visit: Payer: Self-pay | Admitting: Obstetrics

## 2020-04-17 DIAGNOSIS — Z3042 Encounter for surveillance of injectable contraceptive: Secondary | ICD-10-CM

## 2020-04-24 ENCOUNTER — Ambulatory Visit: Payer: Medicaid Other

## 2020-04-25 ENCOUNTER — Ambulatory Visit: Payer: Medicaid Other

## 2020-05-01 ENCOUNTER — Ambulatory Visit (INDEPENDENT_AMBULATORY_CARE_PROVIDER_SITE_OTHER): Payer: Medicaid Other

## 2020-05-01 ENCOUNTER — Other Ambulatory Visit: Payer: Self-pay

## 2020-05-01 DIAGNOSIS — Z3042 Encounter for surveillance of injectable contraceptive: Secondary | ICD-10-CM

## 2020-05-01 NOTE — Progress Notes (Signed)
Subjective:  Pt in for Depo Provera injection.    Objective: On time for Depo. No unusual complaints.    Assessment:  Depo given R upper outer quadrant. Pt tolerated injection.   Plan:  Next injection due Apr 11-25.    . Administrations This Visit    medroxyPROGESTERone (DEPO-PROVERA) injection 150 mg    Admin Date 05/01/2020 Action Given Dose 150 mg Route Intramuscular Administered By Lewayne Bunting, CMA

## 2020-05-04 NOTE — Progress Notes (Signed)
Patient was assessed and managed by nursing staff during this encounter. I have reviewed the chart and agree with the documentation and plan. I have also made any necessary editorial changes.  Catalina Antigua, MD 05/04/2020 8:33 AM

## 2020-07-25 ENCOUNTER — Ambulatory Visit: Payer: Medicaid Other

## 2020-07-26 ENCOUNTER — Inpatient Hospital Stay: Admission: RE | Admit: 2020-07-26 | Payer: Medicaid Other | Source: Ambulatory Visit

## 2020-08-02 ENCOUNTER — Other Ambulatory Visit: Payer: Self-pay

## 2020-08-02 ENCOUNTER — Ambulatory Visit (INDEPENDENT_AMBULATORY_CARE_PROVIDER_SITE_OTHER): Payer: Medicaid Other | Admitting: Obstetrics and Gynecology

## 2020-08-02 ENCOUNTER — Encounter: Payer: Self-pay | Admitting: Obstetrics and Gynecology

## 2020-08-02 VITALS — BP 156/93 | HR 80 | Ht 64.0 in | Wt 136.0 lb

## 2020-08-02 DIAGNOSIS — N920 Excessive and frequent menstruation with regular cycle: Secondary | ICD-10-CM | POA: Diagnosis not present

## 2020-08-02 DIAGNOSIS — Z3042 Encounter for surveillance of injectable contraceptive: Secondary | ICD-10-CM

## 2020-08-02 DIAGNOSIS — Z309 Encounter for contraceptive management, unspecified: Secondary | ICD-10-CM | POA: Insufficient documentation

## 2020-08-02 MED ORDER — MEDROXYPROGESTERONE ACETATE 150 MG/ML IM SUSP
150.0000 mg | Freq: Once | INTRAMUSCULAR | Status: AC
  Administered 2020-08-02: 150 mg via INTRAMUSCULAR

## 2020-08-02 NOTE — Progress Notes (Signed)
RGYN patient presents for problem visit and to discuss surgery for irregular bleeding.  Pt denies any pain at this time.   Last pap: 06/10/19 WNL    Pt complains of "spotting"  all the time.   Current contraception is Depo.  Last injection was 05/01/20 Pt would like depo injection today.

## 2020-08-02 NOTE — Progress Notes (Signed)
Patient ID: Emily Morse, female   DOB: [birth year], 35 y.o.   MRN: [id number] Emily Morse presents for discussion of her BTB while on Depo Provera. Pt has been on Depo Provera for the  Last 4 yrs d/t H/O regular heavy cycles. She is S/P BTL and H/O c section x 3. Pap smear UTD. H/O Thrombocytopenia, last seen by Heme/Onc in 2021  PE AF VSS Lungs clear Heart RRR Abd soft + BS  A/P DUB  Dx discussed with pt. Will check GYN U/S. Continue with Depo Provera for now. Will check CBC d/t H/O thrombocytopenia.  F/U in 3-4 weeks to discuss U/S results.

## 2020-08-08 ENCOUNTER — Ambulatory Visit (HOSPITAL_COMMUNITY): Admission: RE | Admit: 2020-08-08 | Payer: Medicaid Other | Source: Ambulatory Visit

## 2020-08-17 ENCOUNTER — Other Ambulatory Visit: Payer: Self-pay

## 2020-08-17 ENCOUNTER — Ambulatory Visit (HOSPITAL_COMMUNITY)
Admission: RE | Admit: 2020-08-17 | Discharge: 2020-08-17 | Disposition: A | Discharge status: Home / Self Care | Payer: Medicaid Other | Source: Ambulatory Visit | Attending: Obstetrics and Gynecology | Admitting: Obstetrics and Gynecology

## 2020-08-17 DIAGNOSIS — N920 Excessive and frequent menstruation with regular cycle: Secondary | ICD-10-CM | POA: Insufficient documentation

## 2020-09-12 ENCOUNTER — Other Ambulatory Visit: Payer: Self-pay

## 2020-09-12 MED ORDER — NITROFURANTOIN MONOHYD MACRO 100 MG PO CAPS: 100.0000 mg | ORAL_CAPSULE | Freq: Two times a day (BID) | ORAL | 0 refills | Status: DC

## 2020-09-12 NOTE — Telephone Encounter (Signed)
Patient thinks she has a UTI and is requesting something be called into her pharmacy.She does not have a car to come in at the moment.

## 2020-09-14 ENCOUNTER — Other Ambulatory Visit: Payer: Self-pay | Admitting: Hematology and Oncology

## 2020-09-14 ENCOUNTER — Other Ambulatory Visit: Payer: Self-pay

## 2020-09-14 DIAGNOSIS — D500 Iron deficiency anemia secondary to blood loss (chronic): Secondary | ICD-10-CM

## 2020-09-14 DIAGNOSIS — D696 Thrombocytopenia, unspecified: Secondary | ICD-10-CM

## 2020-09-15 ENCOUNTER — Ambulatory Visit: Payer: Medicaid Other | Admitting: Hematology and Oncology

## 2020-09-15 ENCOUNTER — Other Ambulatory Visit: Payer: Medicaid Other

## 2021-01-12 ENCOUNTER — Ambulatory Visit: Payer: Medicaid Other

## 2021-02-13 ENCOUNTER — Telehealth: Payer: Self-pay | Admitting: *Deleted

## 2021-02-13 NOTE — Telephone Encounter (Signed)
TC from patient concerned for no menses since August. Wanted confirmation of BTL. BTL confirmed in 2016 with last C/S. Was on BCP. Advised patient that cycle regularity can take time to reestablish after stopping BCP. Advised patient of small chance for pregnancy. Patient has abdominal cramping. Advised pregnancy test. Advised to seek care for worsening abdominal pain or other emergent symptoms.

## 2021-03-07 ENCOUNTER — Ambulatory Visit (INDEPENDENT_AMBULATORY_CARE_PROVIDER_SITE_OTHER): Payer: Medicaid Other | Admitting: Obstetrics

## 2021-03-07 ENCOUNTER — Other Ambulatory Visit: Payer: Self-pay

## 2021-03-07 ENCOUNTER — Encounter: Payer: Self-pay | Admitting: Obstetrics

## 2021-03-07 VITALS — BP 140/89 | HR 71 | Wt 138.2 lb

## 2021-03-07 DIAGNOSIS — N926 Irregular menstruation, unspecified: Secondary | ICD-10-CM | POA: Diagnosis not present

## 2021-03-07 DIAGNOSIS — Z113 Encounter for screening for infections with a predominantly sexual mode of transmission: Secondary | ICD-10-CM | POA: Diagnosis not present

## 2021-03-07 LAB — POCT URINE PREGNANCY: Preg Test, Ur: NEGATIVE

## 2021-03-07 NOTE — Progress Notes (Signed)
Pt presents for amenorrhea. Last Depo in April 2022 UPT neg Pt requests to check for FHT's - No FHT's heard with doppler.

## 2021-03-07 NOTE — Progress Notes (Signed)
Patient ID: Emily Morse, female   DOB: [birth year], 35 y.o.   MRN: [id number]  Chief Complaint  Patient presents with   Menstrual Problem    HPI Emily Morse is a 35 y.o. female.  Presents with missed periods after last Depo injection in April 2022.  She has positive subjective signs of pregnancy.  Home UPT negative  HPI  Past Medical History:  Diagnosis Date   Anemia    Herpes    last out break 2008   Hx of gastroesophageal reflux (GERD)    Thrombocytopenia (HCC)     Past Surgical History:  Procedure Laterality Date   CESAREAN SECTION  10/22/2011   Procedure: CESAREAN SECTION;  Surgeon: Brock Bad, MD;  Location: [facility];  Service: Gynecology;  Laterality: N/A;   CESAREAN SECTION N/A 12/07/2013   Procedure: CESAREAN SECTION;  Surgeon: Brock Bad, MD;  Location: [facility];  Service: Obstetrics;  Laterality: N/A;   CESAREAN SECTION N/A 12/05/2014   Procedure: CESAREAN SECTION;  Surgeon: Brock Bad, MD;  Location: [facility];  Service: Obstetrics;  Laterality: N/A;   DILATION AND CURETTAGE OF UTERUS      Family History  Problem Relation Age of Onset   Hypertension Mother    Other Neg Hx    Alcohol abuse Neg Hx    Arthritis Neg Hx    Asthma Neg Hx    Birth defects Neg Hx    Cancer Neg Hx    COPD Neg Hx    Depression Neg Hx    Diabetes Neg Hx    Drug abuse Neg Hx    Early death Neg Hx    Hearing loss Neg Hx    Heart disease Neg Hx    Hyperlipidemia Neg Hx    Kidney disease Neg Hx    Learning disabilities Neg Hx    Mental illness Neg Hx    Mental retardation Neg Hx    Miscarriages / Stillbirths Neg Hx    Stroke Neg Hx    Vision loss Neg Hx    Varicose Veins Neg Hx     Social History Social History   Tobacco Use   Smoking status: Former    Types: Cigars    Quit date: 05/06/2013    Years since quitting: 7.8   Smokeless tobacco: Never   Tobacco comments:    black and mild occasional  Vaping Use   Vaping Use: Never used  Substance Use  Topics   Alcohol use: Yes    Alcohol/week: 0.0 standard drinks    Comment: Socially   Drug use: No    No Known Allergies  Current Outpatient Medications  Medication Sig Dispense Refill   medroxyPROGESTERone (DEPO-PROVERA) 150 MG/ML injection INJECT 1 ML (150 MG TOTAL) INTO THE MUSCLE EVERY 3 (THREE) MONTHS. (Patient not taking: Reported on 03/07/2021) 1 mL 3   nitrofurantoin, macrocrystal-monohydrate, (MACROBID) 100 MG capsule Take 1 capsule (100 mg total) by mouth 2 (two) times daily. (Patient not taking: Reported on 03/07/2021) 14 capsule 0   Current Facility-Administered Medications  Medication Dose Route Frequency Provider Last Rate Last Admin   medroxyPROGESTERone (DEPO-PROVERA) injection 150 mg  150 mg Intramuscular Q90 days Brock Bad, MD   150 mg at 05/01/20 1559    Review of Systems Review of Systems Constitutional: negative for fatigue and weight loss Respiratory: negative for cough and wheezing Cardiovascular: negative for chest pain, fatigue and palpitations Gastrointestinal: negative for abdominal pain and change in bowel habits Genitourinary:positive  for missed periods and positive subjective signs of pregnancy Integument/breast: negative for nipple discharge Musculoskeletal:negative for myalgias Neurological: negative for gait problems and tremors Behavioral/Psych: negative for abusive relationship, depression Endocrine: negative for temperature intolerance      Blood pressure 140/89, pulse 71, weight 138 lb 3.2 oz (62.7 kg).  Physical Exam Physical Exam General:   Alert and no distress  Skin:   no rash or abnormalities  Lungs:   clear to auscultation bilaterally  Heart:   regular rate and rhythm, S1, S2 normal, no murmur, click, rub or gallop  Breasts:   Not examined  Abdomen:  normal findings: no organomegaly, soft, non-tender and no hernia                              FHR: none, with Doppler  I have spent a total of 20 minutes of face-to-face time,  excluding clinical staff time, reviewing notes and preparing to see patient, ordering tests and/or medications, and counseling the patient.   Data Reviewed UPT:  Negative  Assessment     1. Missed periods Rx: - POCT urine pregnancy - hCG, quantitative, pregnancy; Future - Thyroid Panel With TSH  2. Screening for STD (sexually transmitted disease) Rx: - Hepatitis B surface antigen - Hepatitis C antibody - HIV Antibody (routine testing w rflx) - RPR     Plan   Follow up in 2 days for quant results  Orders Placed This Encounter  Procedures   Hepatitis B surface antigen   Hepatitis C antibody   HIV Antibody (routine testing w rflx)   RPR   hCG, quantitative, pregnancy    Standing Status:   Future    Standing Expiration Date:   03/07/2022   Thyroid Panel With TSH   POCT urine pregnancy      Brock Bad, MD 03/07/2021 4:25 PM

## 2021-03-08 LAB — THYROID PANEL WITH TSH
Free Thyroxine Index: 2.4 (ref 1.2–4.9)
T3 Uptake Ratio: 31 % (ref 24–39)
T4, Total: 7.9 ug/dL (ref 4.5–12.0)
TSH: 2.69 u[IU]/mL (ref 0.450–4.500)

## 2021-03-08 LAB — RPR: RPR Ser Ql: NONREACTIVE

## 2021-03-08 LAB — HEPATITIS B SURFACE ANTIGEN: Hepatitis B Surface Ag: NEGATIVE

## 2021-03-08 LAB — HIV ANTIBODY (ROUTINE TESTING W REFLEX): HIV Screen 4th Generation wRfx: NONREACTIVE

## 2021-03-08 LAB — HEPATITIS C ANTIBODY: Hep C Virus Ab: 0.1 s/co ratio (ref 0.0–0.9)

## 2021-03-08 NOTE — Addendum Note (Signed)
Addended by: Dalphine Handing on: 03/08/2021 04:51 PM   Modules accepted: Orders

## 2021-03-09 ENCOUNTER — Encounter: Payer: Self-pay | Admitting: Obstetrics

## 2021-03-09 ENCOUNTER — Telehealth (INDEPENDENT_AMBULATORY_CARE_PROVIDER_SITE_OTHER): Payer: Medicaid Other | Admitting: Obstetrics

## 2021-03-09 DIAGNOSIS — N926 Irregular menstruation, unspecified: Secondary | ICD-10-CM | POA: Diagnosis not present

## 2021-03-09 NOTE — Progress Notes (Signed)
GYNECOLOGY VIRTUAL VISIT ENCOUNTER NOTE  Provider location: [facility]   Patient location: Home  I connected with Clois Comber on 03/09/21 at 11:15 AM EST by MyChart Video Encounter and verified that I am speaking with the correct person using two identifiers.   I discussed the limitations, risks, security and privacy concerns of performing an evaluation and management service virtually and the availability of in person appointments. I also discussed with the patient that there may be a patient responsible charge related to this service. The patient expressed understanding and agreed to proceed.   History:  Emily Morse is a 35 y.o. [phone] female being evaluated today for missed period  with negative UPT, and results of quantitative beta HCG. She denies any abnormal vaginal discharge, bleeding, pelvic pain or other concerns.       Past Medical History:  Diagnosis Date   Anemia    Herpes    last out break 2008   Hx of gastroesophageal reflux (GERD)    Thrombocytopenia (HCC)    Past Surgical History:  Procedure Laterality Date   CESAREAN SECTION  10/22/2011   Procedure: CESAREAN SECTION;  Surgeon: Brock Bad, MD;  Location: [facility];  Service: Gynecology;  Laterality: N/A;   CESAREAN SECTION N/A 12/07/2013   Procedure: CESAREAN SECTION;  Surgeon: Brock Bad, MD;  Location: [facility];  Service: Obstetrics;  Laterality: N/A;   CESAREAN SECTION N/A 12/05/2014   Procedure: CESAREAN SECTION;  Surgeon: Brock Bad, MD;  Location: [facility];  Service: Obstetrics;  Laterality: N/A;   DILATION AND CURETTAGE OF UTERUS     The following portions of the patient's history were reviewed and updated as appropriate: allergies, current medications, past family history, past medical history, past social history, past surgical history and problem list.   Health Maintenance:  Normal pap and gative HRHPV on 06-10-2019.   Review of Systems:   Pertinent items noted in HPI and remainder of comprehensive ROS otherwise negative.  Physical Exam:   General:  Alert, oriented and cooperative. Patient appears to be in no acute distress.  Mental Status: Normal mood and affect. Normal behavior. Normal judgment and thought content.   Respiratory: Normal respiratory effort, no problems with respiration noted  Rest of physical exam deferred due to type of encounter  Labs and Imaging Results for orders placed or performed in visit on 03/07/21 (from the past 336 hour(s))  Hepatitis B surface antigen   Collection Time: 03/07/21  4:32 PM  Result Value Ref Range   Hepatitis B Surface Ag Negative Negative  Hepatitis C antibody   Collection Time: 03/07/21  4:32 PM  Result Value Ref Range   Hep C Virus Ab 0.1 0.0 - 0.9 s/co ratio  HIV Antibody (routine testing w rflx)   Collection Time: 03/07/21  4:32 PM  Result Value Ref Range   HIV Screen 4th Generation wRfx Non Reactive Non Reactive  RPR   Collection Time: 03/07/21  4:32 PM  Result Value Ref Range   RPR Ser Ql Non Reactive Non Reactive  Thyroid Panel With TSH   Collection Time: 03/07/21  4:32 PM  Result Value Ref Range   TSH 2.690 0.450 - 4.500 uIU/mL   T4, Total 7.9 4.5 - 12.0 ug/dL   T3 Uptake Ratio 31 24 - 39 %   Free Thyroxine Index 2.4 1.2 - 4.9  POCT urine pregnancy   Collection Time: 03/07/21  5:10 PM  Result Value Ref  Range   Preg Test, Ur Negative Negative   No results found.     Assessment and Plan:     1. Missed periods - quantitative beta-HCG result is pending  - follow up in 2 weeks      I discussed the assessment and treatment plan with the patient. The patient was provided an opportunity to ask questions and all were answered. The patient agreed with the plan and demonstrated an understanding of the instructions.   The patient was advised to call back or seek an in-person evaluation/go to the ED if the symptoms worsen or if the condition fails to improve as  anticipated.  I have spent a total of 15 minutes of non-face-to-face time, excluding clinical staff time, reviewing notes and preparing to see patient, ordering tests and/or medications, and counseling the patient.    Coral Ceo, MD [facility]  03/09/21

## 2021-03-10 LAB — BETA HCG QUANT (REF LAB): hCG Quant: <1 m[IU]/mL

## 2021-03-10 LAB — SPECIMEN STATUS REPORT

## 2021-04-05 ENCOUNTER — Telehealth: Payer: Self-pay

## 2021-04-05 NOTE — Telephone Encounter (Signed)
Patient called and left a message on triage vm requesting that Dr. Clearance Coots order her an u/s because she believes that she is between 13-[redacted] weeks pregnant. She states that she is still getting negative pregnancy test and does not want another upt at this time. She states that she has not had a period since August, she is feeling fetal movement, breast tenderness, and back pain. She states that her upt could be coming back negative because twins run in her family and also states that her hormone levels could be low.   I informed patient that Dr. Clearance Coots is not in the office to fulfill her request. I advised patient that she would need to follow up with another provider regarding her amenorrhea.  Patient states that she has an appointment scheduled on 1/12 and will discuss then and hung up the phone.

## 2021-04-12 ENCOUNTER — Emergency Department (HOSPITAL_COMMUNITY)
Admission: EM | Admit: 2021-04-12 | Discharge: 2021-04-12 | Disposition: A | Discharge status: Home / Self Care | Payer: Medicaid Other | Attending: Emergency Medicine | Admitting: Emergency Medicine

## 2021-04-12 DIAGNOSIS — N898 Other specified noninflammatory disorders of vagina: Secondary | ICD-10-CM | POA: Insufficient documentation

## 2021-04-12 DIAGNOSIS — R109 Unspecified abdominal pain: Secondary | ICD-10-CM | POA: Diagnosis not present

## 2021-04-12 DIAGNOSIS — Z5321 Procedure and treatment not carried out due to patient leaving prior to being seen by health care provider: Secondary | ICD-10-CM | POA: Insufficient documentation

## 2021-04-12 DIAGNOSIS — O26891 Other specified pregnancy related conditions, first trimester: Secondary | ICD-10-CM | POA: Insufficient documentation

## 2021-04-12 LAB — CBC WITH DIFFERENTIAL/PLATELET
Abs Immature Granulocytes: 0.03 10*3/uL (ref 0.00–0.07)
Basophils Absolute: 0 10*3/uL (ref 0.0–0.1)
Basophils Relative: 1 %
Eosinophils Absolute: 0.1 10*3/uL (ref 0.0–0.5)
Eosinophils Relative: 1 %
HCT: 46.7 % — ABNORMAL HIGH (ref 36.0–46.0)
Hemoglobin: 15.1 g/dL — ABNORMAL HIGH (ref 12.0–15.0)
Immature Granulocytes: 0 %
Lymphocytes Relative: 22 %
Lymphs Abs: 1.7 10*3/uL (ref 0.7–4.0)
MCH: 30.6 pg (ref 26.0–34.0)
MCHC: 32.3 g/dL (ref 30.0–36.0)
MCV: 94.7 fL (ref 80.0–100.0)
Monocytes Absolute: 0.9 10*3/uL (ref 0.1–1.0)
Monocytes Relative: 11 %
Neutro Abs: 4.9 10*3/uL (ref 1.7–7.7)
Neutrophils Relative %: 65 %
Platelets: 146 10*3/uL — ABNORMAL LOW (ref 150–400)
RBC: 4.93 MIL/uL (ref 3.87–5.11)
RDW: 13 % (ref 11.5–15.5)
WBC: 7.6 10*3/uL (ref 4.0–10.5)
nRBC: 0 % (ref 0.0–0.2)

## 2021-04-12 LAB — COMPREHENSIVE METABOLIC PANEL
ALT: 17 U/L (ref 0–44)
AST: 17 U/L (ref 15–41)
Albumin: 4.2 g/dL (ref 3.5–5.0)
Alkaline Phosphatase: 62 U/L (ref 38–126)
Anion gap: 9 (ref 5–15)
BUN: 9 mg/dL (ref 6–20)
CO2: 24 mmol/L (ref 22–32)
Calcium: 9.7 mg/dL (ref 8.9–10.3)
Chloride: 104 mmol/L (ref 98–111)
Creatinine, Ser: 0.78 mg/dL (ref 0.44–1.00)
GFR, Estimated: >60 mL/min (ref >60)
Glucose, Bld: 106 mg/dL — ABNORMAL HIGH (ref 70–99)
Potassium: 4 mmol/L (ref 3.5–5.1)
Sodium: 137 mmol/L (ref 135–145)
Total Bilirubin: 0.7 mg/dL (ref 0.3–1.2)
Total Protein: 7.5 g/dL (ref 6.5–8.1)

## 2021-04-12 LAB — LIPASE, BLOOD: Lipase: 25 U/L (ref 11–51)

## 2021-04-12 NOTE — ED Notes (Signed)
Called patient the fourth time I will take patient out

## 2021-04-12 NOTE — ED Notes (Signed)
Patient called for lab draw, no answer.

## 2021-04-12 NOTE — ED Triage Notes (Signed)
Pt. Stated, I had my tubes ties 2 years ago and have not had a cycle since August, and 2 weeks ago I had a positive pregnancy test. Emily Morse had some abdominal cramping, my boobs are tender.

## 2021-04-12 NOTE — ED Provider Triage Note (Signed)
Emergency Medicine Provider Triage Evaluation Note  Emily Morse , a 36 y.o. female  was evaluated in triage.  Pt complains of abd pain aches.  Tubes tied 2016. +pregnancy test 2 weeks ago. Came off birth control (depo) august (depo was for heavy cycle). States she never had a cycle after coming off. Took a preg test and was + two weeks ago.   Has had some vaginal bleeding (two weeks ago lasted for 2 days).   + vaginal discharge  NVD - 3 days of vomiting two weeks ago. Occasional nausea since.   Review of Systems  Positive: ?preg, abd cramps and vaginal DC  Negative: Fever   Physical Exam  BP (!) 144/91 (BP Location: Right Arm)    Pulse 85    Temp 98.3 F (36.8 C) (Oral)    Resp 16    LMP  (LMP Unknown)    SpO2 100%  Gen:   Awake, no distress   Resp:  Normal effort  MSK:   Moves extremities without difficulty  Other:  Abd soft NTTP.   Medical Decision Making  Medically screening exam initiated at 8:55 AM.  Appropriate orders placed.  Emily Morse was informed that the remainder of the evaluation will be completed by another provider, this initial triage assessment does not replace that evaluation, and the importance of remaining in the ED until their evaluation is complete.  Labs ordered. Will attempt to follow up on urine pregnancy and talk to [facility] if +   Gailen Shelter, PA 04/12/21 1610    Rolan Bucco, MD 04/12/21 [phone]

## 2021-04-19 ENCOUNTER — Other Ambulatory Visit: Payer: Self-pay

## 2021-04-19 ENCOUNTER — Other Ambulatory Visit (HOSPITAL_COMMUNITY)
Admission: RE | Admit: 2021-04-19 | Discharge: 2021-04-19 | Disposition: A | Discharge status: Home / Self Care | Payer: Medicaid Other | Source: Ambulatory Visit | Attending: Obstetrics and Gynecology | Admitting: Obstetrics and Gynecology

## 2021-04-19 ENCOUNTER — Encounter: Payer: Self-pay | Admitting: Obstetrics and Gynecology

## 2021-04-19 ENCOUNTER — Ambulatory Visit (INDEPENDENT_AMBULATORY_CARE_PROVIDER_SITE_OTHER): Payer: Medicaid Other | Admitting: Obstetrics and Gynecology

## 2021-04-19 DIAGNOSIS — Z202 Contact with and (suspected) exposure to infections with a predominantly sexual mode of transmission: Secondary | ICD-10-CM

## 2021-04-19 DIAGNOSIS — N938 Other specified abnormal uterine and vaginal bleeding: Secondary | ICD-10-CM | POA: Diagnosis not present

## 2021-04-19 NOTE — Progress Notes (Signed)
Emily Morse presents with DUB since stopping here Depo Provera. She is concerned she might be pregnant. UPT and BHCG have been negative in the past. Last Depo Provera injection in May. Had BTB while on Depo Provera. Bleeding stopped in September. Sexual active without problems.  Has a vaginal discharge as well  PE AF VSS Lungs clear Heart RRR Abd soft + BS  A/P DUB        Vaginal discharge Self swab today. Will check GYN U/S for pt reassurance. Pt had family member who was pregnant and it was not detected until U/S. Discuss DUB following discontinuation of Depo Provera.  F/U per test results.

## 2021-04-19 NOTE — Patient Instructions (Signed)
Abnormal Uterine Bleeding ?Abnormal uterine bleeding means bleeding more than normal from your womb (uterus). It can include: ?Bleeding after sex. ?Bleeding between monthly (menstrual) periods. ?Bleeding that is heavier than normal. ?Monthly periods that last longer than normal. ?Bleeding after you have stopped having your monthly period (menopause). ?You should see a doctor for any kind of bleeding that is not normal. Treatment depends on the cause of your bleeding and how much you bleed. ?Follow these instructions at home: ?Medicines ?Take over-the-counter and prescription medicines only as told by your doctor. ?Ask your doctor about: ?Taking medicines such as aspirin and ibuprofen. Do not take these medicines unless your doctor tells you to take them. ?Taking over-the-counter medicines, vitamins, herbs, and supplements. ?You may be given iron pills. Take them as told by your doctor. ?Managing constipation ?If you take iron pills, you may need to take these actions to prevent or treat trouble pooping (constipation): ?Drink enough fluid to keep your pee (urine) pale yellow. ?Take over-the-counter or prescription medicines. ?Eat foods that are high in fiber. These include beans, whole grains, and fresh fruits and vegetables. ?Limit foods that are high in fat and sugar. These include fried or sweet foods. ?Activity ?Change your activity to decrease bleeding if you need to change your sanitary pad more than one time every 2 hours: ?Lie in bed with your feet raised (elevated). ?Place a cold pack on your lower belly. ?Rest as much as you are able until the bleeding stops or slows down. ?General instructions ?Do not use tampons, douche, or have sex until your doctor says these things are okay. ?Change your pads often. ?Get regular exams. These include: ?Pelvic exams. ?Screenings for cancer of the cervix. ?It is up to you to get the results of any tests that are done. Ask how to get your results when they are  ready. ?Watch for any changes in your bleeding. For 2 months, write down: ?When your monthly period starts. ?When your monthly period ends. ?When you get any abnormal bleeding from your vagina. ?What problems you notice. ?Keep all follow-up visits. ?Contact a doctor if: ?The bleeding lasts more than one week. ?You feel dizzy at times. ?You feel like you may vomit (nausea). ?You vomit. ?You feel light-headed or weak. ?Your symptoms get worse. ?Get help right away if: ?You faint. ?You have to change pads every hour. ?You have pain in your belly. ?You have a fever or chills. ?You get sweaty or weak. ?You pass large blood clots from your vagina. ?These symptoms may be an emergency. Get help right away. Call your local emergency services (911 in the [place] ?Do not wait to see if the symptoms will go away. ?Do not drive yourself to the hospital. ?Summary ?Abnormal uterine bleeding means bleeding more than normal from your womb (uterus). ?Any kind of bleeding that is not normal should be checked by a doctor. ?Treatment depends on the cause of your bleeding and how much you bleed. ?Get help right away if you faint, you have to change pads every hour, or you pass large blood clots from your vagina. ?This information is not intended to replace advice given to you by your health care provider. Make sure you discuss any questions you have with your health care provider. ?Document Revised: 07/25/2020 Document Reviewed: 07/25/2020 ?Elsevier Patient Education ? 2022 [employer] ? ?

## 2021-04-19 NOTE — Progress Notes (Signed)
Pt presents c/o possible pregnancy.  No period since stopping Depo in August.  Pt reports pink spotting and malodorous vaginal discharge. She requests to swab herself.

## 2021-04-20 LAB — CERVICOVAGINAL ANCILLARY ONLY
Bacterial Vaginitis (gardnerella): POSITIVE — AB
Candida Glabrata: NEGATIVE
Candida Vaginitis: NEGATIVE
Chlamydia: NEGATIVE
Comment: NEGATIVE
Comment: NEGATIVE
Comment: NEGATIVE
Comment: NEGATIVE
Comment: NEGATIVE
Comment: NORMAL
Neisseria Gonorrhea: NEGATIVE
Trichomonas: POSITIVE — AB

## 2021-04-23 ENCOUNTER — Telehealth: Payer: Self-pay

## 2021-04-23 MED ORDER — METRONIDAZOLE 500 MG PO TABS: 2000.0000 mg | ORAL_TABLET | Freq: Once | ORAL | 0 refills | Status: AC

## 2021-04-23 NOTE — Telephone Encounter (Signed)
S/w pt and advised of results, rx sent to pharmacy. °

## 2021-04-24 ENCOUNTER — Other Ambulatory Visit: Payer: Medicaid Other

## 2021-04-26 ENCOUNTER — Ambulatory Visit
Admission: RE | Admit: 2021-04-26 | Discharge: 2021-04-26 | Disposition: A | Discharge status: Home / Self Care | Payer: Medicaid Other | Source: Ambulatory Visit | Attending: Obstetrics and Gynecology | Admitting: Obstetrics and Gynecology

## 2021-04-26 ENCOUNTER — Other Ambulatory Visit: Payer: Self-pay

## 2021-04-26 DIAGNOSIS — N938 Other specified abnormal uterine and vaginal bleeding: Secondary | ICD-10-CM | POA: Insufficient documentation

## 2021-04-27 ENCOUNTER — Encounter: Payer: Self-pay | Admitting: Obstetrics and Gynecology

## 2021-12-24 ENCOUNTER — Telehealth: Payer: Self-pay

## 2021-12-24 DIAGNOSIS — R399 Unspecified symptoms and signs involving the genitourinary system: Secondary | ICD-10-CM

## 2021-12-24 MED ORDER — NITROFURANTOIN MONOHYD MACRO 100 MG PO CAPS: 100.0000 mg | ORAL_CAPSULE | Freq: Two times a day (BID) | ORAL | 1 refills | Status: AC

## 2021-12-24 NOTE — Telephone Encounter (Signed)
Patient complains of having dark urine, malodorous urine, urinary frequency, urinary urgency, and dull lower right side pain. Sx started about 3-4 days ago. She has taken tylenol for pain with no relief. Denies pain and bleeding with urination.

## 2023-08-11 ENCOUNTER — Other Ambulatory Visit: Payer: Self-pay

## 2023-08-11 DIAGNOSIS — A6009 Herpesviral infection of other urogenital tract: Secondary | ICD-10-CM

## 2023-08-11 MED ORDER — VALACYCLOVIR HCL 1 G PO TABS: ORAL_TABLET | ORAL | 99 refills | Status: AC

## 2023-08-11 NOTE — Progress Notes (Signed)
 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

## 2024-05-10 ENCOUNTER — Ambulatory Visit: Admitting: Orthopedic Surgery
